# Patient Record
Sex: Male | Born: 1940 | Race: Black or African American | Hispanic: No | Marital: Married | Smoking: Former smoker
Health system: Southern US, Community
[De-identification: ages and names within clinical notes are randomized; demographics above are authoritative.]

## PROBLEM LIST (undated history)

## (undated) DIAGNOSIS — Z860100 Personal history of colon polyps, unspecified: Secondary | ICD-10-CM

## (undated) DIAGNOSIS — I10 Essential (primary) hypertension: Secondary | ICD-10-CM

## (undated) DIAGNOSIS — M1990 Unspecified osteoarthritis, unspecified site: Secondary | ICD-10-CM

## (undated) DIAGNOSIS — I1A0 Resistant hypertension: Secondary | ICD-10-CM

## (undated) DIAGNOSIS — I2510 Atherosclerotic heart disease of native coronary artery without angina pectoris: Secondary | ICD-10-CM

## (undated) DIAGNOSIS — I739 Peripheral vascular disease, unspecified: Principal | ICD-10-CM

## (undated) DIAGNOSIS — K219 Gastro-esophageal reflux disease without esophagitis: Secondary | ICD-10-CM

## (undated) DIAGNOSIS — Z8619 Personal history of other infectious and parasitic diseases: Secondary | ICD-10-CM

## (undated) DIAGNOSIS — I6529 Occlusion and stenosis of unspecified carotid artery: Secondary | ICD-10-CM

## (undated) DIAGNOSIS — I255 Ischemic cardiomyopathy: Principal | ICD-10-CM

## (undated) DIAGNOSIS — J189 Pneumonia, unspecified organism: Secondary | ICD-10-CM

## (undated) DIAGNOSIS — I440 Atrioventricular block, first degree: Secondary | ICD-10-CM

## (undated) DIAGNOSIS — E119 Type 2 diabetes mellitus without complications: Secondary | ICD-10-CM

## (undated) DIAGNOSIS — R531 Weakness: Secondary | ICD-10-CM

## (undated) DIAGNOSIS — M5137 Other intervertebral disc degeneration, lumbosacral region: Secondary | ICD-10-CM

## (undated) DIAGNOSIS — G8929 Other chronic pain: Secondary | ICD-10-CM

## (undated) DIAGNOSIS — M549 Dorsalgia, unspecified: Secondary | ICD-10-CM

## (undated) DIAGNOSIS — M51379 Other intervertebral disc degeneration, lumbosacral region without mention of lumbar back pain or lower extremity pain: Secondary | ICD-10-CM

## (undated) DIAGNOSIS — G5601 Carpal tunnel syndrome, right upper limb: Secondary | ICD-10-CM

## (undated) DIAGNOSIS — E785 Hyperlipidemia, unspecified: Secondary | ICD-10-CM

## (undated) HISTORY — PX: CORONARY ARTERY BYPASS GRAFT: SHX141

## (undated) HISTORY — DX: Essential (primary) hypertension: I10

## (undated) HISTORY — DX: Ischemic cardiomyopathy: I25.5

## (undated) HISTORY — DX: Atherosclerotic heart disease of native coronary artery without angina pectoris: I25.10

## (undated) HISTORY — DX: Resistant hypertension: I1A.0

## (undated) HISTORY — PX: COLONOSCOPY: SHX174

## (undated) HISTORY — DX: Occlusion and stenosis of unspecified carotid artery: I65.29

## (undated) HISTORY — PX: UMBILICAL HERNIA REPAIR: SHX196

## (undated) HISTORY — DX: Unspecified osteoarthritis, unspecified site: M19.90

## (undated) HISTORY — DX: Peripheral vascular disease, unspecified: I73.9

## (undated) HISTORY — PX: CORONARY ANGIOPLASTY: SHX604

## (undated) SURGERY — PERCUTANEOUS CORONARY STENT INTERVENTION (PCI-S)
Anesthesia: LOCAL

---

## 2010-04-03 ENCOUNTER — Other Ambulatory Visit: Payer: Self-pay | Admitting: Specialist

## 2010-04-03 ENCOUNTER — Encounter (HOSPITAL_COMMUNITY)
Admission: RE | Admit: 2010-04-03 | Discharge: 2010-04-03 | Disposition: A | Discharge status: Home / Self Care | Payer: Medicare Other | Source: Ambulatory Visit | Attending: Specialist | Admitting: Specialist

## 2010-04-03 ENCOUNTER — Other Ambulatory Visit (HOSPITAL_COMMUNITY): Payer: Self-pay | Admitting: Specialist

## 2010-04-03 ENCOUNTER — Encounter: Payer: Self-pay | Admitting: Specialist

## 2010-04-03 ENCOUNTER — Encounter (HOSPITAL_COMMUNITY): Payer: Medicare Other

## 2010-04-03 DIAGNOSIS — M1710 Unilateral primary osteoarthritis, unspecified knee: Secondary | ICD-10-CM

## 2010-04-03 DIAGNOSIS — Z01818 Encounter for other preprocedural examination: Secondary | ICD-10-CM | POA: Insufficient documentation

## 2010-04-03 DIAGNOSIS — IMO0002 Reserved for concepts with insufficient information to code with codable children: Secondary | ICD-10-CM | POA: Insufficient documentation

## 2010-04-03 DIAGNOSIS — Z01810 Encounter for preprocedural cardiovascular examination: Secondary | ICD-10-CM | POA: Insufficient documentation

## 2010-04-03 DIAGNOSIS — Z01812 Encounter for preprocedural laboratory examination: Secondary | ICD-10-CM | POA: Insufficient documentation

## 2010-04-03 LAB — DIFFERENTIAL
Basophils Absolute: 0 10*3/uL (ref 0.0–0.1)
Eosinophils Absolute: 0.2 10*3/uL (ref 0.0–0.7)
Lymphs Abs: 1.2 10*3/uL (ref 0.7–4.0)
Monocytes Absolute: 0.4 10*3/uL (ref 0.1–1.0)
Monocytes Relative: 6 % (ref 3–12)

## 2010-04-03 LAB — COMPREHENSIVE METABOLIC PANEL
ALT: 18 U/L (ref 0–53)
Alkaline Phosphatase: 56 U/L (ref 39–117)
CO2: 25 mEq/L (ref 19–32)
GFR calc non Af Amer: >60 mL/min (ref >60)
Glucose, Bld: 225 mg/dL — ABNORMAL HIGH (ref 70–99)
Potassium: 3.9 mEq/L (ref 3.5–5.1)
Sodium: 135 mEq/L (ref 135–145)

## 2010-04-03 LAB — CBC
HCT: 41.1 % (ref 39.0–52.0)
Hemoglobin: 13.1 g/dL (ref 13.0–17.0)
MCHC: 31.9 g/dL (ref 30.0–36.0)
MCV: 81.4 fL (ref 78.0–100.0)
WBC: 7 10*3/uL (ref 4.0–10.5)

## 2010-04-03 LAB — URINALYSIS, ROUTINE W REFLEX MICROSCOPIC
Nitrite: NEGATIVE
Protein, ur: NEGATIVE mg/dL
Urine Glucose, Fasting: 250 mg/dL — AB
pH: 6 (ref 5.0–8.0)

## 2010-04-03 LAB — SURGICAL PCR SCREEN: Staphylococcus aureus: NEGATIVE

## 2010-04-03 LAB — PROTIME-INR: Prothrombin Time: 13.9 seconds (ref 11.6–15.2)

## 2010-04-06 ENCOUNTER — Inpatient Hospital Stay (HOSPITAL_COMMUNITY): Admission: RE | Admit: 2010-04-06 | Payer: Medicare Other | Source: Ambulatory Visit | Admitting: Specialist

## 2010-05-15 ENCOUNTER — Ambulatory Visit (HOSPITAL_COMMUNITY)
Admission: RE | Admit: 2010-05-15 | Discharge: 2010-05-15 | Disposition: A | Discharge status: Home / Self Care | Payer: Medicare Other | Source: Ambulatory Visit | Attending: Cardiology | Admitting: Cardiology

## 2010-05-15 DIAGNOSIS — I2510 Atherosclerotic heart disease of native coronary artery without angina pectoris: Secondary | ICD-10-CM | POA: Insufficient documentation

## 2010-05-15 DIAGNOSIS — E119 Type 2 diabetes mellitus without complications: Secondary | ICD-10-CM | POA: Insufficient documentation

## 2010-05-15 HISTORY — PX: CARDIAC CATHETERIZATION: SHX172

## 2010-05-15 HISTORY — PX: LEFT HEART CATH AND CORONARY ANGIOGRAPHY: CATH118249

## 2010-05-16 ENCOUNTER — Encounter (INDEPENDENT_AMBULATORY_CARE_PROVIDER_SITE_OTHER): Payer: Medicare Other | Admitting: Cardiothoracic Surgery

## 2010-05-16 DIAGNOSIS — I2510 Atherosclerotic heart disease of native coronary artery without angina pectoris: Secondary | ICD-10-CM

## 2010-05-17 NOTE — Consult Note (Signed)
NEW PATIENT CONSULTATION  [name], [name] DOB:  03/13/1940                                        May 16, 2010 CHART #:  [id number]  REASON FOR CONSULTATION:  Severe multivessel coronary artery disease.  CHIEF COMPLAINT:  Abnormal EKG, abnormal stress test.  HISTORY OF PRESENT ILLNESS:  I was asked to evaluate this 70 year old African American diabetic, hypertensive male for possible multivessel bypass grafting after being recently diagnosed with a severe multivessel coronary artery disease.  The patient was evaluated by Dr. Herbie Baltimore for cardiology clearance prior to right knee surgery (total knee replacement).  The patient has no history of coronary artery disease or particularly strong family history of CAD.  His risk factors include a remote history of smoking, hypertension, hyperlipidemia, and type 2 diabetes mellitus.  He underwent an echo which showed EF of 45% with some inferior apical hypokinesia and a stress test was performed which showed reversible ischemia in the inferior and apical areas as well. This was felt to be a high-risk stress test and the patient subsequently underwent diagnostic cardiac catheterization 48 hours ago.  This was performed via the right radial artery by Dr. Rennis Golden.  This demonstrates a 99% stenosis of the LAD, 99% stenosis of the OM1 and 80% stenosis of the OM2 and a totally occluded right coronary which fills via collaterals in the posterior descending.  A ventriculogram was not performed due to his history of mildly elevated creatinine of 1.7 and his echo showing mild- to-moderate LV dysfunction.  He subsequently presents here for evaluation for multivessel bypass grafting.  The patient denies any specific angina, dyspnea on exertion, or orthopnea.  PAST MEDICAL HISTORY: 1. Hypertension. 2. Type 2 diabetes mellitus. 3. Dyslipidemia. 4. No known drug allergies. 5. 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 6. Status post umbilical hernia repair.  HOME MEDICATIONS:  Metformin 500 b.i.d., hydrochlorothiazide 12.5 daily, Coreg 6.25 mg b.i.d., losartan 100 mg daily, vitamin D, __________ 81 mg p.o. twice a week, Aleve p.r.n. arthritis pain in the knees, simvastatin 20 mg a day.  SOCIAL HISTORY:  The patient is retired from the [employer]. He is active in his yard work and he does not currently smoke or drink alcohol.  FAMILY HISTORY:  Negative for premature coronary disease.  Positive for diabetes and hypertension.  REVIEW OF SYSTEMS:  CONSTITUTIONAL:  Review is negative fever or weight loss. ENT:  Review is negative for difficulty swallowing, positive for partial plate in the maxilla. THORACIC:  Negative for history of thoracic trauma, pneumothorax, or rib fracture. CARDIAC:  Positive for coronary artery disease.  Negative for history of cardiac valve disease or arrhythmia or murmur. GI:  Negative for hepatitis, jaundice, or blood per rectum. UROLOGIC:  Negative for BPH or dysuria. VASCULAR:  Positive for varicose veins of the left leg treated with sclerotherapy, right leg venous review is normal.  No history of TIA or claudication. ENDOCRINE:  Positive for diabetes.  Negative for thyroid disease. NEUROLOGIC:  Negative for stroke or seizure.  PHYSICAL EXAMINATION:  Vital Signs:  The patient is 6 feet 2 inches and weighs 235 pounds, blood pressure 140/77, pulse 78, respirations 18, saturation 97%.  General:  He is alert and pleasant.  HEENT: Normocephalic.  Pupils are equal.  Dentition is adequate.  Neck:  Without JVD, mass, or bruit.  Lymphatics:  No palpable supraclavicular cervical adenopathy.  Breath sounds are clear and equal.  There is no thoracic deformity.  Cardiac:  Regular without S3, gallop, murmur, or rub.  Abdomen:  Soft, nontender, obese without pulsatile mass palpable. Extremities:  No clubbing, cyanosis, or edema.  He has  no evidence of hematoma in his right wrist.  Peripheral pulses are all intact.  He has some scars in his left lower leg along the pretibial region from varicose vein sclerotherapy.  The right leg shows no evidence of venous insufficiency.  Neurologic:  Intact.  LABORATORY DATA:  Reviewed his coronary arteriogram and agreed with Dr. Blanchie Dessert evaluation of severe multivessel disease with mild-to-moderate LV dysfunction.  PLAN:  The patient will be prepared for multivessel bypass grafting on May 22, 2010, with grafts being planned at the LAD, OM1, OM2, and the distal right posterior descending.  I have discussed the details of surgery, the expected benefits, the alternatives, and the risks involved. He understands and agrees to proceed.  He will stop his metformin 48 hours before surgery.  We will plan on doing an intraoperative TEE to evaluate his global LV function again.  Kerin Perna, M.D. Electronically Signed  PV/MEDQ  D:  05/16/2010  T:  05/17/2010  Job:  [id number]  cc:   Landry Corporal, MD

## 2010-05-17 NOTE — Procedures (Signed)
  NAMEHOWARD, [name]             ACCOUNT NO.:  [id number]  MEDICAL RECORD NO.:  [id number]           PATIENT TYPE:  O  LOCATION:  [facility]                         FACILITY:  [facility]  PHYSICIAN:  Italy Wyndell Cardiff, MD         DATE OF BIRTH:  [birth year]  DATE OF PROCEDURE:  05/15/2010 DATE OF DISCHARGE:  05/15/2010                           CARDIAC CATHETERIZATION   This is a left heart catheterization.  OPERATOR:  Italy Oskar Cretella, MD  INDICATIONS:  Abnormal stress test.  BACKGROUND FORMATION:  Mr. Ehrmann is a 70 year old gentleman who had an abnormal nuclear stress test showing an EF of 45-50% with moderate apical hypokinesis and midinferior wall hypokinesis.  His a nuclear stress test demonstrated moderate perfusion defect in the basal inferior, midinferior, and apical inferior walls, and there was anterolateral, inferolateral, and apical reversibility suggesting a large mostly reversible defect of the circumflex territory and possibly the right coronary artery territory.  PROCEDURE:  The patient was brought to the cardiac catheterization lab after procedural time-out and radiation safety time-out.  the patient was sterilely prepped and draped in the usual fashion.  The area around the right radial artery was identified and after local anesthesia was obtained, the patient was given 1 mg of Versed and 25 mcg of fentanyl for moderate sedation.  The right radial artery was accessed with a straight needle and wire, and a 5-French radial access catheter was placed.  Through that, subsequent cardiac catheterization was performed with a 5-French TIG 4.0 catheter, and ventricular pressure was obtained with the pigtail catheter.  The patient was given 5000 units of heparin per protocol and 10 mL of radial cocktail to prevent spasm.  FINDINGS: 1. Left main - no significant disease. 2. LAD - two sequential proximal 70-80% stenoses, and the LAD was also     distally subtotally occluded. 3. Left  circumflex 99% ostial OM1, which is a large branch and located     just distal to the circumflex takeoff, and a 75% left     circumflex/OM2 bifurcation lesion again with a large OM2 vessel. 4. RCA - proximally occluded, fills with collaterals from the left     system. 5. LVEDP = 15 mmHg.  IMPRESSION: 1. Three-vessel coronary artery disease in a diabetic. 2. LVEDP = 15 mmHg.  PLAN:  Evaluate for coronary artery bypass grafting.     Italy Rahsaan Weakland, MD     CH/MEDQ  D:  05/15/2010  T:  05/16/2010  Job:  [id number]  cc:   Landry Corporal, MD  Electronically Signed by Kirtland Bouchard. Derriana Oser M.D. on 05/17/2010 08:56:48 AM

## 2010-05-18 ENCOUNTER — Ambulatory Visit (HOSPITAL_COMMUNITY)
Admission: RE | Admit: 2010-05-18 | Discharge: 2010-05-18 | Disposition: A | Discharge status: Home / Self Care | Payer: Medicare Other | Source: Ambulatory Visit | Attending: Cardiothoracic Surgery | Admitting: Cardiothoracic Surgery

## 2010-05-18 ENCOUNTER — Inpatient Hospital Stay (HOSPITAL_COMMUNITY)
Admission: RE | Admit: 2010-05-18 | Discharge: 2010-05-18 | Disposition: A | Discharge status: Home / Self Care | Payer: Medicare Other | Source: Ambulatory Visit | Attending: Cardiothoracic Surgery | Admitting: Cardiothoracic Surgery

## 2010-05-18 ENCOUNTER — Encounter (HOSPITAL_COMMUNITY)
Admission: RE | Admit: 2010-05-18 | Discharge: 2010-05-18 | Disposition: A | Discharge status: Home / Self Care | Payer: Medicare Other | Source: Ambulatory Visit | Attending: Cardiothoracic Surgery | Admitting: Cardiothoracic Surgery

## 2010-05-18 ENCOUNTER — Other Ambulatory Visit: Payer: Self-pay | Admitting: Cardiothoracic Surgery

## 2010-05-18 DIAGNOSIS — I2510 Atherosclerotic heart disease of native coronary artery without angina pectoris: Secondary | ICD-10-CM

## 2010-05-18 DIAGNOSIS — Z01810 Encounter for preprocedural cardiovascular examination: Secondary | ICD-10-CM | POA: Insufficient documentation

## 2010-05-18 LAB — URINALYSIS, ROUTINE W REFLEX MICROSCOPIC
Bilirubin Urine: NEGATIVE
Glucose, UA: NEGATIVE mg/dL
Hgb urine dipstick: NEGATIVE
Ketones, ur: NEGATIVE mg/dL
Nitrite: NEGATIVE
Protein, ur: NEGATIVE mg/dL
Specific Gravity, Urine: 1.006 (ref 1.005–1.030)
Urobilinogen, UA: 0.2 mg/dL (ref 0.0–1.0)
pH: 6 (ref 5.0–8.0)

## 2010-05-18 LAB — COMPREHENSIVE METABOLIC PANEL
ALT: 21 U/L (ref 0–53)
AST: 17 U/L (ref 0–37)
Albumin: 3.8 g/dL (ref 3.5–5.2)
Alkaline Phosphatase: 55 U/L (ref 39–117)
BUN: 10 mg/dL (ref 6–23)
CO2: 21 mEq/L (ref 19–32)
Calcium: 9.5 mg/dL (ref 8.4–10.5)
Chloride: 104 mEq/L (ref 96–112)
Creatinine, Ser: 0.87 mg/dL (ref 0.4–1.5)
GFR calc Af Amer: >60 mL/min (ref >60)
GFR calc non Af Amer: >60 mL/min (ref >60)
Glucose, Bld: 111 mg/dL — ABNORMAL HIGH (ref 70–99)
Potassium: 4.3 mEq/L (ref 3.5–5.1)
Sodium: 133 mEq/L — ABNORMAL LOW (ref 135–145)
Total Bilirubin: 0.8 mg/dL (ref 0.3–1.2)
Total Protein: 7 g/dL (ref 6.0–8.3)

## 2010-05-18 LAB — BLOOD GAS, ARTERIAL
Acid-Base Excess: 0.7 mmol/L (ref 0.0–2.0)
Bicarbonate: 24.4 mEq/L — ABNORMAL HIGH (ref 20.0–24.0)
Drawn by: 181601
FIO2: 0.21 %
O2 Saturation: 97.2 %
Patient temperature: 98.6
TCO2: 25.5 mmol/L (ref 0–100)
pCO2 arterial: 36.2 mmHg (ref 35.0–45.0)
pH, Arterial: 7.443 (ref 7.350–7.450)
pO2, Arterial: 89.2 mmHg (ref 80.0–100.0)

## 2010-05-18 LAB — PROTIME-INR
INR: 0.98 (ref 0.00–1.49)
Prothrombin Time: 13.2 seconds (ref 11.6–15.2)

## 2010-05-18 LAB — TYPE AND SCREEN
ABO/RH(D): A POS
Antibody Screen: NEGATIVE

## 2010-05-18 LAB — CBC
HCT: 38.7 % — ABNORMAL LOW (ref 39.0–52.0)
Hemoglobin: 13.3 g/dL (ref 13.0–17.0)
MCH: 26.6 pg (ref 26.0–34.0)
MCHC: 34.4 g/dL (ref 30.0–36.0)
MCV: 77.4 fL — ABNORMAL LOW (ref 78.0–100.0)
Platelets: 177 10*3/uL (ref 150–400)
RBC: 5 MIL/uL (ref 4.22–5.81)
RDW: 13.3 % (ref 11.5–15.5)
WBC: 7.2 10*3/uL (ref 4.0–10.5)

## 2010-05-18 LAB — SURGICAL PCR SCREEN
MRSA, PCR: NEGATIVE
Staphylococcus aureus: NEGATIVE

## 2010-05-18 LAB — APTT: aPTT: 34 seconds (ref 24–37)

## 2010-05-18 LAB — ABO/RH: ABO/RH(D): A POS

## 2010-05-19 LAB — HEMOGLOBIN A1C
Hgb A1c MFr Bld: 7.4 % — ABNORMAL HIGH (ref <5.7)
Mean Plasma Glucose: 166 mg/dL — ABNORMAL HIGH (ref <117)

## 2010-05-22 ENCOUNTER — Inpatient Hospital Stay (HOSPITAL_COMMUNITY)
Admission: RE | Admit: 2010-05-22 | Discharge: 2010-05-26 | DRG: 236 | Disposition: A | Discharge status: Home / Self Care | Payer: Medicare Other | Source: Ambulatory Visit | Attending: Cardiothoracic Surgery | Admitting: Cardiothoracic Surgery

## 2010-05-22 ENCOUNTER — Inpatient Hospital Stay (HOSPITAL_COMMUNITY): Payer: Medicare Other

## 2010-05-22 DIAGNOSIS — I2510 Atherosclerotic heart disease of native coronary artery without angina pectoris: Secondary | ICD-10-CM

## 2010-05-22 DIAGNOSIS — Z7982 Long term (current) use of aspirin: Secondary | ICD-10-CM

## 2010-05-22 DIAGNOSIS — Z87891 Personal history of nicotine dependence: Secondary | ICD-10-CM

## 2010-05-22 DIAGNOSIS — J9819 Other pulmonary collapse: Secondary | ICD-10-CM | POA: Diagnosis not present

## 2010-05-22 DIAGNOSIS — E119 Type 2 diabetes mellitus without complications: Secondary | ICD-10-CM | POA: Diagnosis present

## 2010-05-22 DIAGNOSIS — E785 Hyperlipidemia, unspecified: Secondary | ICD-10-CM | POA: Diagnosis present

## 2010-05-22 DIAGNOSIS — D62 Acute posthemorrhagic anemia: Secondary | ICD-10-CM | POA: Diagnosis not present

## 2010-05-22 DIAGNOSIS — I10 Essential (primary) hypertension: Secondary | ICD-10-CM | POA: Diagnosis present

## 2010-05-22 DIAGNOSIS — D696 Thrombocytopenia, unspecified: Secondary | ICD-10-CM | POA: Diagnosis present

## 2010-05-22 LAB — POCT I-STAT 3, ART BLOOD GAS (G3+)
Acid-base deficit: 2 mmol/L (ref 0.0–2.0)
Acid-base deficit: 2 mmol/L (ref 0.0–2.0)
Acid-base deficit: 2 mmol/L (ref 0.0–2.0)
Bicarbonate: 23.1 mEq/L (ref 20.0–24.0)
Bicarbonate: 24 mEq/L (ref 20.0–24.0)
Bicarbonate: 23.3 mEq/L (ref 20.0–24.0)
O2 Saturation: 92 %
O2 Saturation: 98 %
O2 Saturation: 96 %
Patient temperature: 35.8
Patient temperature: 36
Patient temperature: 36.6
TCO2: 24 mmol/L (ref 0–100)
TCO2: 25 mmol/L (ref 0–100)
TCO2: 25 mmol/L (ref 0–100)
pCO2 arterial: 38.9 mmHg (ref 35.0–45.0)
pCO2 arterial: 44.3 mmHg (ref 35.0–45.0)
pCO2 arterial: 41.2 mmHg (ref 35.0–45.0)
pH, Arterial: 7.376 (ref 7.350–7.450)
pH, Arterial: 7.336 — ABNORMAL LOW (ref 7.350–7.450)
pH, Arterial: 7.36 (ref 7.350–7.450)
pO2, Arterial: 61 mmHg — ABNORMAL LOW (ref 80.0–100.0)
pO2, Arterial: 112 mmHg — ABNORMAL HIGH (ref 80.0–100.0)
pO2, Arterial: 81 mmHg (ref 80.0–100.0)

## 2010-05-22 LAB — CBC
HCT: 30 % — ABNORMAL LOW (ref 39.0–52.0)
HCT: 30.8 % — ABNORMAL LOW (ref 39.0–52.0)
Hemoglobin: 10.2 g/dL — ABNORMAL LOW (ref 13.0–17.0)
Hemoglobin: 10.3 g/dL — ABNORMAL LOW (ref 13.0–17.0)
MCH: 26.5 pg (ref 26.0–34.0)
MCH: 26.3 pg (ref 26.0–34.0)
MCHC: 34 g/dL (ref 30.0–36.0)
MCHC: 33.4 g/dL (ref 30.0–36.0)
MCV: 77.9 fL — ABNORMAL LOW (ref 78.0–100.0)
MCV: 78.6 fL (ref 78.0–100.0)
Platelets: 120 10*3/uL — ABNORMAL LOW (ref 150–400)
Platelets: 147 10*3/uL — ABNORMAL LOW (ref 150–400)
RBC: 3.85 MIL/uL — ABNORMAL LOW (ref 4.22–5.81)
RBC: 3.92 MIL/uL — ABNORMAL LOW (ref 4.22–5.81)
RDW: 13.3 % (ref 11.5–15.5)
RDW: 13.5 % (ref 11.5–15.5)
WBC: 7.9 10*3/uL (ref 4.0–10.5)
WBC: 10.9 10*3/uL — ABNORMAL HIGH (ref 4.0–10.5)

## 2010-05-22 LAB — POCT I-STAT, CHEM 8
BUN: 10 mg/dL (ref 6–23)
Calcium, Ion: 1.22 mmol/L (ref 1.12–1.32)
Chloride: 106 mEq/L (ref 96–112)
Creatinine, Ser: 1 mg/dL (ref 0.4–1.5)
Glucose, Bld: 176 mg/dL — ABNORMAL HIGH (ref 70–99)
HCT: 30 % — ABNORMAL LOW (ref 39.0–52.0)
Hemoglobin: 10.2 g/dL — ABNORMAL LOW (ref 13.0–17.0)
Potassium: 4.3 mEq/L (ref 3.5–5.1)
Sodium: 139 mEq/L (ref 135–145)
TCO2: 22 mmol/L (ref 0–100)

## 2010-05-22 LAB — POCT I-STAT 4, (NA,K, GLUC, HGB,HCT)
Glucose, Bld: 114 mg/dL — ABNORMAL HIGH (ref 70–99)
HCT: 30 % — ABNORMAL LOW (ref 39.0–52.0)
Hemoglobin: 10.2 g/dL — ABNORMAL LOW (ref 13.0–17.0)
Potassium: 3.7 mEq/L (ref 3.5–5.1)
Sodium: 137 mEq/L (ref 135–145)

## 2010-05-22 LAB — HEMOGLOBIN AND HEMATOCRIT, BLOOD
HCT: 25.6 % — ABNORMAL LOW (ref 39.0–52.0)
Hemoglobin: 8.6 g/dL — ABNORMAL LOW (ref 13.0–17.0)

## 2010-05-22 LAB — PROTIME-INR
INR: 1.36 (ref 0.00–1.49)
Prothrombin Time: 17 seconds — ABNORMAL HIGH (ref 11.6–15.2)

## 2010-05-22 LAB — MAGNESIUM: Magnesium: 3.1 mg/dL — ABNORMAL HIGH (ref 1.5–2.5)

## 2010-05-22 LAB — CREATININE, SERUM
Creatinine, Ser: 0.9 mg/dL (ref 0.4–1.5)
GFR calc Af Amer: >60 mL/min (ref >60)
GFR calc non Af Amer: >60 mL/min (ref >60)

## 2010-05-22 LAB — PLATELET COUNT: Platelets: 97 10*3/uL — ABNORMAL LOW (ref 150–400)

## 2010-05-22 LAB — APTT: aPTT: 35 seconds (ref 24–37)

## 2010-05-22 LAB — GLUCOSE, CAPILLARY
Glucose-Capillary: 170 mg/dL — ABNORMAL HIGH (ref 70–99)
Glucose-Capillary: 156 mg/dL — ABNORMAL HIGH (ref 70–99)

## 2010-05-23 ENCOUNTER — Inpatient Hospital Stay (HOSPITAL_COMMUNITY): Payer: Medicare Other

## 2010-05-23 DIAGNOSIS — E1165 Type 2 diabetes mellitus with hyperglycemia: Secondary | ICD-10-CM

## 2010-05-23 DIAGNOSIS — IMO0001 Reserved for inherently not codable concepts without codable children: Secondary | ICD-10-CM

## 2010-05-23 LAB — PREPARE PLATELETS: Unit division: 0

## 2010-05-23 LAB — BASIC METABOLIC PANEL
BUN: 9 mg/dL (ref 6–23)
CO2: 24 mEq/L (ref 19–32)
Calcium: 8.3 mg/dL — ABNORMAL LOW (ref 8.4–10.5)
Chloride: 107 mEq/L (ref 96–112)
Creatinine, Ser: 1 mg/dL (ref 0.4–1.5)
GFR calc Af Amer: >60 mL/min (ref >60)
GFR calc non Af Amer: >60 mL/min (ref >60)
Glucose, Bld: 131 mg/dL — ABNORMAL HIGH (ref 70–99)
Potassium: 4.3 mEq/L (ref 3.5–5.1)
Sodium: 136 mEq/L (ref 135–145)

## 2010-05-23 LAB — CBC
HCT: 30.7 % — ABNORMAL LOW (ref 39.0–52.0)
HCT: 31.2 % — ABNORMAL LOW (ref 39.0–52.0)
Hemoglobin: 10.1 g/dL — ABNORMAL LOW (ref 13.0–17.0)
Hemoglobin: 10.4 g/dL — ABNORMAL LOW (ref 13.0–17.0)
MCH: 25.8 pg — ABNORMAL LOW (ref 26.0–34.0)
MCH: 26.5 pg (ref 26.0–34.0)
MCHC: 32.9 g/dL (ref 30.0–36.0)
MCHC: 33.3 g/dL (ref 30.0–36.0)
MCV: 78.5 fL (ref 78.0–100.0)
MCV: 79.4 fL (ref 78.0–100.0)
Platelets: 161 10*3/uL (ref 150–400)
Platelets: 146 10*3/uL — ABNORMAL LOW (ref 150–400)
RBC: 3.91 MIL/uL — ABNORMAL LOW (ref 4.22–5.81)
RBC: 3.93 MIL/uL — ABNORMAL LOW (ref 4.22–5.81)
RDW: 13.6 % (ref 11.5–15.5)
RDW: 13.7 % (ref 11.5–15.5)
WBC: 9.7 10*3/uL (ref 4.0–10.5)
WBC: 12.4 10*3/uL — ABNORMAL HIGH (ref 4.0–10.5)

## 2010-05-23 LAB — POCT I-STAT, CHEM 8
BUN: 13 mg/dL (ref 6–23)
Calcium, Ion: 1.21 mmol/L (ref 1.12–1.32)
Chloride: 100 mEq/L (ref 96–112)
Creatinine, Ser: 1.3 mg/dL (ref 0.4–1.5)
Glucose, Bld: 220 mg/dL — ABNORMAL HIGH (ref 70–99)
HCT: 32 % — ABNORMAL LOW (ref 39.0–52.0)
Hemoglobin: 10.9 g/dL — ABNORMAL LOW (ref 13.0–17.0)
Potassium: 4.3 mEq/L (ref 3.5–5.1)
Sodium: 135 mEq/L (ref 135–145)
TCO2: 25 mmol/L (ref 0–100)

## 2010-05-23 LAB — GLUCOSE, CAPILLARY
Glucose-Capillary: 112 mg/dL — ABNORMAL HIGH (ref 70–99)
Glucose-Capillary: 124 mg/dL — ABNORMAL HIGH (ref 70–99)
Glucose-Capillary: 126 mg/dL — ABNORMAL HIGH (ref 70–99)
Glucose-Capillary: 138 mg/dL — ABNORMAL HIGH (ref 70–99)
Glucose-Capillary: 173 mg/dL — ABNORMAL HIGH (ref 70–99)
Glucose-Capillary: 189 mg/dL — ABNORMAL HIGH (ref 70–99)
Glucose-Capillary: 193 mg/dL — ABNORMAL HIGH (ref 70–99)
Glucose-Capillary: 209 mg/dL — ABNORMAL HIGH (ref 70–99)
Glucose-Capillary: 74 mg/dL (ref 70–99)
Glucose-Capillary: 90 mg/dL (ref 70–99)

## 2010-05-23 LAB — MAGNESIUM
Magnesium: 2.8 mg/dL — ABNORMAL HIGH (ref 1.5–2.5)
Magnesium: 2.4 mg/dL (ref 1.5–2.5)

## 2010-05-23 LAB — CREATININE, SERUM
Creatinine, Ser: 1.09 mg/dL (ref 0.4–1.5)
GFR calc Af Amer: >60 mL/min (ref >60)
GFR calc non Af Amer: >60 mL/min (ref >60)

## 2010-05-23 NOTE — Op Note (Signed)
[name], [name] NO.:  [id number]  MEDICAL RECORD NO.:  [id number]           PATIENT TYPE:  I  LOCATION:  2301                         FACILITY:  [facility]  PHYSICIAN:  Kerin Perna, M.D.  DATE OF BIRTH:  06-06-1940  DATE OF PROCEDURE:  05/22/2010 DATE OF DISCHARGE:                              OPERATIVE REPORT   OPERATION: 1. Coronary artery bypass grafting x5 (left internal mammary artery to     left anterior descending (coronary) artery, saphenous vein graft to     distal right coronary artery, saphenous vein graft to ramus     intermediate, sequential saphenous vein graft to obtuse marginal 1     and obtuse marginal 2). 2. Endoscopic harvest of right leg greater saphenous vein.  SURGEON:  Kerin Perna, MD  ASSISTANT:  Billy Crane PA-C  PREOPERATIVE DIAGNOSIS:  Severe multivessel coronary artery disease with positive stress test.  POSTOPERATIVE DIAGNOSIS:  Severe multivessel coronary artery disease with positive stress test.  ANESTHESIA:  General by Dr. Claybon George who performed transesophageal 2-D echo.  INDICATIONS:  The patient is a 70 year old African American male who presents with positive stress test which was taken prior to elective knee replacement surgery.  A subsequent cardiac catheterization was recommended by Dr. Herbie George which demonstrated severe multivessel coronary artery disease with total occlusion of right coronary, 90% stenosis of the LAD, total occlusion of the ramus intermediate and 95% stenosis of proximal circumflex.  Overall LVEF by echo was 45% and he was felt to be candidate for surgical revascularization based on his coronary anatomy and early positive stress test.  Prior to surgery, I examined the patient in the office and reviewed results of the cardiac cath with the patient and his family.  I discussed the indications and expected benefits of coronary artery bypass surgery for treatment of his severe  multivessel coronary artery disease.  I reviewed the alternatives to surgical therapy as well.  I discussed the details of surgery including location of the surgical incisions, use of general anesthesia and cardiopulmonary bypass, and the expected postoperative recovery.  I discussed the risks he would face with the operation including the risks of stroke, MI, bleeding, infection, blood transfusion requirement, and death.  After reviewing these issues, he demonstrated his understanding and agreed to proceed with surgery under what I felt was an informed consent.  OPERATIVE FINDINGS:  The conduit was excellent internal mammary artery and fair to good saphenous vein quality.  The coronary targets were adequate but the distal RCA was intramyocardial, the ramus intermediate was intramyocardial.  PROCEDURE:  The patient was brought to the operating room and placed spine on the operating room table where general anesthesia was induced under invasive hemodynamic monitoring.  A transesophageal 2-D echo probe was placed by the anesthesiologist which confirmed preoperative ejection fraction of 45% without significant valvular disease.  The patient was prepped and draped as a sterile field and a sternal incision was made as the saphenous vein was harvested endoscopically from the right leg.  The left internal mammary artery was harvested as a pedicle graft from its origin at the subclavian vessels  and was a 1.5-mm vessel with good flow. Heparin was administered after the vein was harvested and inspected and a sternal retractor was placed using the deep blades due to the patient's body habitus.  The pericardium was opened and suspended and pursestrings were placed in the ascending aorta and right atrium.  After the ACT was documented as being therapeutic, the patient was cannulated and placed on cardiopulmonary bypass at which time the distal coronary arteries were identified for grafting.  The  mammary artery and vein grafts were prepared for the distal anastomoses and cardioplegic catheters were placed for both antegrade and retrograde cold blood cardioplegia.  The patient was cooled to 32 degrees at which time the aortic crossclamp was applied.  1 liter of cold blood cardioplegia was delivered in split doses between the antegrade aortic and retrograde coronary sinus catheters.  There was good cardioplegic arrest and septal temperature dropped less than 12 degrees.  Cardioplegia was delivered every 20 minutes or less while the crossclamp was in place.  The distal coronary anastomoses were then performed.  First, distal anastomosis was to the distal right coronary at the bifurcation of the posterior descending.  The posterior descending was fairly deep intramyocardial.  The vessel was 1.5 mm and the reverse saphenous vein was sewn end-to-side with running 7-0 Prolene with good flow from the graft.  The second distal anastomosis was to the ramus intermediate branch of the left coronary.  It was intramyocardial and was a 1.5-mm vessel which was totally occluded proximally.  A reverse saphenous vein was sewn end-to-side with running 7-0 Prolene with good flow through the graft.  The third and fourth distal anastomoses consisted of the sequential vein graft from the OM-1 to the OM-2.  The OM-1 was a 1.5-mm vessel with proximal 80% stenosis and a side-to-side anastomosis with a reverse saphenous vein was sewn with running 7-0 Prolene with good flow. The fourth distal anastomosis was a continuation of the sequential vein graft to the OM2 which was also intramyocardial and a 1.7-mm vessel which had a high-grade 80-90% stenosis.  The end of the vein was sewn end-to-side with running 7-0 Prolene with good flow through the graft. Cardioplegia was redosed.  The fifth distal anastomosis was to the middle third of the LAD.  Proximally, there was a 90% stenosis.  The left IMA pedicle was  brought through an opening created in the left lateral pericardium and was brought down onto the LAD and sewn end-to- side with running 8-0 Prolene.  There was good flow through the anastomosis after briefly releasing the pedicle bulldog on the mammary artery which was then replaced and the pedicle was secured to the epicardium with 6-0 Prolenes.  Cardioplegia was redosed.  While the crossclamp was still in place, three proximal vein anastomoses were performed on the ascending aorta using a 4.0-mm punch with running 7-0 Prolene.  Prior to tying down the final proximal anastomosis, air was vented from the coronaries with a dose of retrograde warm blood cardioplegia.  The crossclamp was then removed.  The heart resumed a spontaneous rhythm.  The cardioplegic catheters were removed.  The air was vented from the vein grafts with a 27-gauge needle.  The patient was rewarmed.  The distal and proximal anastomoses were checked and found to be hemostatic.  Temporary pacing wires were applied on the right atrium and right ventricle and the ventilator was resumed after the lungs reinflated.  When the patient was adequately rewarmed to 37 degrees, we weaned the  patient from bypass without difficulty with low-dose dopamine.  Protamine was administered after the patient remained hemodynamically stable with good global LV function by TEE.  There was no adverse reaction of the protamine and the platelet came back 90,000 with some persistent coagulopathy so a unit of platelet was transfused with improved coagulation function.  The mediastinum was irrigated and the superior pericardial fat was closed over the aorta and vein grafts.  Two mediastinal and a left pleural chest tubes were placed and brought out through separate incisions.  At this time, the sternum was closed with interrupted steel wire.  The pectoralis fascia was closed with a running #1 Vicryl.  The subcutaneous and skin layers were closed  with running Vicryl and sterile dressings were applied.  Total bypass time was 151 minutes.     Kerin Perna, M.D.     PV/MEDQ  D:  05/22/2010  T:  05/23/2010  Job:  [id number]  cc:   Landry Corporal, MD  Electronically Signed by Kerin Perna M.D. on 05/23/2010 03:59:12 PM

## 2010-05-24 ENCOUNTER — Inpatient Hospital Stay (HOSPITAL_COMMUNITY): Payer: Medicare Other

## 2010-05-24 DIAGNOSIS — I2510 Atherosclerotic heart disease of native coronary artery without angina pectoris: Secondary | ICD-10-CM | POA: Diagnosis present

## 2010-05-24 DIAGNOSIS — E1165 Type 2 diabetes mellitus with hyperglycemia: Secondary | ICD-10-CM

## 2010-05-24 DIAGNOSIS — IMO0001 Reserved for inherently not codable concepts without codable children: Secondary | ICD-10-CM

## 2010-05-24 LAB — GLUCOSE, CAPILLARY
Glucose-Capillary: 127 mg/dL — ABNORMAL HIGH (ref 70–99)
Glucose-Capillary: 163 mg/dL — ABNORMAL HIGH (ref 70–99)
Glucose-Capillary: 102 mg/dL — ABNORMAL HIGH (ref 70–99)
Glucose-Capillary: 107 mg/dL — ABNORMAL HIGH (ref 70–99)

## 2010-05-24 LAB — BASIC METABOLIC PANEL
BUN: 9 mg/dL (ref 6–23)
CO2: 28 mEq/L (ref 19–32)
Calcium: 8.4 mg/dL (ref 8.4–10.5)
Chloride: 104 mEq/L (ref 96–112)
Creatinine, Ser: 0.97 mg/dL (ref 0.4–1.5)
GFR calc Af Amer: >60 mL/min (ref >60)
GFR calc non Af Amer: >60 mL/min (ref >60)
Glucose, Bld: 93 mg/dL (ref 70–99)
Potassium: 3.9 mEq/L (ref 3.5–5.1)
Sodium: 136 mEq/L (ref 135–145)

## 2010-05-24 LAB — CBC
HCT: 29 % — ABNORMAL LOW (ref 39.0–52.0)
Hemoglobin: 9.6 g/dL — ABNORMAL LOW (ref 13.0–17.0)
MCH: 26.3 pg (ref 26.0–34.0)
MCHC: 33.1 g/dL (ref 30.0–36.0)
MCV: 79.5 fL (ref 78.0–100.0)
Platelets: 132 10*3/uL — ABNORMAL LOW (ref 150–400)
RBC: 3.65 MIL/uL — ABNORMAL LOW (ref 4.22–5.81)
RDW: 13.7 % (ref 11.5–15.5)
WBC: 8.5 10*3/uL (ref 4.0–10.5)

## 2010-05-25 ENCOUNTER — Inpatient Hospital Stay (HOSPITAL_COMMUNITY): Payer: Medicare Other

## 2010-05-25 DIAGNOSIS — IMO0001 Reserved for inherently not codable concepts without codable children: Secondary | ICD-10-CM

## 2010-05-25 DIAGNOSIS — E1165 Type 2 diabetes mellitus with hyperglycemia: Secondary | ICD-10-CM

## 2010-05-25 LAB — CBC
HCT: 30.4 % — ABNORMAL LOW (ref 39.0–52.0)
Hemoglobin: 9.8 g/dL — ABNORMAL LOW (ref 13.0–17.0)
MCH: 25.6 pg — ABNORMAL LOW (ref 26.0–34.0)
MCHC: 32.2 g/dL (ref 30.0–36.0)
MCV: 79.4 fL (ref 78.0–100.0)
Platelets: 173 10*3/uL (ref 150–400)
RBC: 3.83 MIL/uL — ABNORMAL LOW (ref 4.22–5.81)
RDW: 13.9 % (ref 11.5–15.5)
WBC: 8.9 10*3/uL (ref 4.0–10.5)

## 2010-05-25 LAB — BASIC METABOLIC PANEL
BUN: 9 mg/dL (ref 6–23)
CO2: 28 mEq/L (ref 19–32)
Calcium: 8.9 mg/dL (ref 8.4–10.5)
Chloride: 100 mEq/L (ref 96–112)
Creatinine, Ser: 0.95 mg/dL (ref 0.4–1.5)
GFR calc Af Amer: >60 mL/min (ref >60)
GFR calc non Af Amer: >60 mL/min (ref >60)
Glucose, Bld: 98 mg/dL (ref 70–99)
Potassium: 3.9 mEq/L (ref 3.5–5.1)
Sodium: 135 mEq/L (ref 135–145)

## 2010-05-25 LAB — GLUCOSE, CAPILLARY
Glucose-Capillary: 103 mg/dL — ABNORMAL HIGH (ref 70–99)
Glucose-Capillary: 131 mg/dL — ABNORMAL HIGH (ref 70–99)
Glucose-Capillary: 110 mg/dL — ABNORMAL HIGH (ref 70–99)
Glucose-Capillary: 105 mg/dL — ABNORMAL HIGH (ref 70–99)

## 2010-05-26 LAB — POCT I-STAT 4, (NA,K, GLUC, HGB,HCT)
Glucose, Bld: 134 mg/dL — ABNORMAL HIGH (ref 70–99)
Glucose, Bld: 156 mg/dL — ABNORMAL HIGH (ref 70–99)
Glucose, Bld: 106 mg/dL — ABNORMAL HIGH (ref 70–99)
Glucose, Bld: 108 mg/dL — ABNORMAL HIGH (ref 70–99)
Glucose, Bld: 125 mg/dL — ABNORMAL HIGH (ref 70–99)
Glucose, Bld: 157 mg/dL — ABNORMAL HIGH (ref 70–99)
HCT: 28 % — ABNORMAL LOW (ref 39.0–52.0)
HCT: 34 % — ABNORMAL LOW (ref 39.0–52.0)
HCT: 26 % — ABNORMAL LOW (ref 39.0–52.0)
HCT: 27 % — ABNORMAL LOW (ref 39.0–52.0)
HCT: 27 % — ABNORMAL LOW (ref 39.0–52.0)
HCT: 28 % — ABNORMAL LOW (ref 39.0–52.0)
Hemoglobin: 11.6 g/dL — ABNORMAL LOW (ref 13.0–17.0)
Hemoglobin: 9.5 g/dL — ABNORMAL LOW (ref 13.0–17.0)
Hemoglobin: 8.8 g/dL — ABNORMAL LOW (ref 13.0–17.0)
Hemoglobin: 9.2 g/dL — ABNORMAL LOW (ref 13.0–17.0)
Hemoglobin: 9.2 g/dL — ABNORMAL LOW (ref 13.0–17.0)
Hemoglobin: 9.5 g/dL — ABNORMAL LOW (ref 13.0–17.0)
Potassium: 3.5 mEq/L (ref 3.5–5.1)
Potassium: 3.9 mEq/L (ref 3.5–5.1)
Potassium: 4.4 mEq/L (ref 3.5–5.1)
Potassium: 5.2 mEq/L — ABNORMAL HIGH (ref 3.5–5.1)
Potassium: 4 mEq/L (ref 3.5–5.1)
Potassium: 4.6 mEq/L (ref 3.5–5.1)
Sodium: 135 mEq/L (ref 135–145)
Sodium: 139 mEq/L (ref 135–145)
Sodium: 135 mEq/L (ref 135–145)
Sodium: 136 mEq/L (ref 135–145)
Sodium: 137 mEq/L (ref 135–145)
Sodium: 138 mEq/L (ref 135–145)

## 2010-05-26 LAB — POCT I-STAT 3, ART BLOOD GAS (G3+)
Acid-base deficit: 1 mmol/L (ref 0.0–2.0)
Acid-base deficit: 1 mmol/L (ref 0.0–2.0)
Bicarbonate: 23.5 mEq/L (ref 20.0–24.0)
Bicarbonate: 23 mEq/L (ref 20.0–24.0)
O2 Saturation: 100 %
O2 Saturation: 100 %
TCO2: 25 mmol/L (ref 0–100)
TCO2: 24 mmol/L (ref 0–100)
pCO2 arterial: 39 mmHg (ref 35.0–45.0)
pCO2 arterial: 36.8 mmHg (ref 35.0–45.0)
pH, Arterial: 7.387 (ref 7.350–7.450)
pH, Arterial: 7.405 (ref 7.350–7.450)
pO2, Arterial: 358 mmHg — ABNORMAL HIGH (ref 80.0–100.0)
pO2, Arterial: 280 mmHg — ABNORMAL HIGH (ref 80.0–100.0)

## 2010-05-26 LAB — POCT I-STAT 3, VENOUS BLOOD GAS (G3P V)
Acid-base deficit: 1 mmol/L (ref 0.0–2.0)
Bicarbonate: 24.3 mEq/L — ABNORMAL HIGH (ref 20.0–24.0)
O2 Saturation: 85 %
TCO2: 26 mmol/L (ref 0–100)
pCO2, Ven: 42.1 mmHg — ABNORMAL LOW (ref 45.0–50.0)
pH, Ven: 7.37 — ABNORMAL HIGH (ref 7.250–7.300)
pO2, Ven: 51 mmHg — ABNORMAL HIGH (ref 30.0–45.0)

## 2010-05-26 LAB — POCT I-STAT GLUCOSE
Glucose, Bld: 175 mg/dL — ABNORMAL HIGH (ref 70–99)
Glucose, Bld: 126 mg/dL — ABNORMAL HIGH (ref 70–99)
Operator id: 230421
Operator id: 3342

## 2010-05-26 LAB — GLUCOSE, CAPILLARY
Glucose-Capillary: 107 mg/dL — ABNORMAL HIGH (ref 70–99)
Glucose-Capillary: 116 mg/dL — ABNORMAL HIGH (ref 70–99)

## 2010-05-29 LAB — POCT I-STAT 4, (NA,K, GLUC, HGB,HCT)
Glucose, Bld: 165 mg/dL — ABNORMAL HIGH (ref 70–99)
HCT: 35 % — ABNORMAL LOW (ref 39.0–52.0)
Hemoglobin: 11.9 g/dL — ABNORMAL LOW (ref 13.0–17.0)
Potassium: 4 mEq/L (ref 3.5–5.1)
Sodium: 139 mEq/L (ref 135–145)

## 2010-05-30 NOTE — Discharge Summary (Signed)
[name], [name] NO.:  [id number]  MEDICAL RECORD NO.:  [id number]           PATIENT TYPE:  I  LOCATION:  2017                         FACILITY:  [facility]  PHYSICIAN:  Kerin Perna, M.D.  DATE OF BIRTH:  [birth year]  DATE OF ADMISSION:  05/22/2010 DATE OF DISCHARGE:  05/26/2010                              DISCHARGE SUMMARY   HISTORY:  The patient is a 70 year old male with history of multiple medical comorbidities including diabetes and hypertension who was recently evaluated for an abnormal stress test and abnormal EKG and found to have severe multivessel coronary artery disease.  He was admitted for surgical revascularization.  The patient was evaluated by Dr. Herbie Baltimore for cardiology clearance prior to right knee surgery, which was planned to be a total knee replacement.  The patient had no history of coronary artery disease or particular strong family history of coronary artery disease, but did have a remote history of smoking as well as hypertension, hyperlipidemia, and type 2 diabetes mellitus.  He underwent an echocardiogram which showed ejection fraction of 45% with some inferoapical hypokinesis and a stress test was performed, which showed reversible ischemia in the inferior and apical areas as well. This was felt to be a high-risk testing and the patient subsequently underwent cardiac catheterization.  This was performed via the radial artery by Dr. Rennis Golden.  This demonstrated a 99% stenosis of the LAD, 99% stenosis of the obtuse marginal #1, an 80% stenosis of the obtuse marginal #2, and a totally occluded right coronary artery which filled via collaterals into the posterior descending.  The ventriculogram was not performed due to a history of mildly elevated creatinine of 1.7 and his echo showing mild-to-moderate LV dysfunction.  He was referred to Dr. Donata Clay for surgical opinion.  Dr. Donata Clay evaluated the patient and his studies and agreed  with recommendations to proceed with surgical revascularization.  He was admitted to this hospitalization for this procedure.  PAST MEDICAL HISTORY: 1. Hypertension. 2. Type 2 diabetes mellitus. 3. Dyslipidemia.  ALLERGIES:  No known drug allergies.  PAST SURGICAL HISTORY: 1. Venous insufficiency of the left leg status post sclerotherapy of     varicose veins. 2. Status post umbilical hernia repair.  HOME MEDICATIONS:  Include the following; 1. Metformin 500 mg b.i.d. 2. Hydrochlorothiazide 12.5 daily. 3. Coreg 6.25 mg b.i.d. 4. Losartan 100 mg daily. 5. Vitamin D daily. 6. Enteric-coated aspirin 81 mg twice a week. 7. Aleve p.r.n. 8. Arthritis pain medication for his knees, unspecified. 9. Simvastatin 20 mg daily.  SOCIAL HISTORY:  The patient is retired from [employer].  He is active in yard work.  He does not currently smoke or drink.  He does have a remote history of tobacco use.  FAMILY HISTORY:  Negative for premature coronary artery disease. Positive for diabetes and hypertension.  REVIEW OF SYSTEMS:  Please see the history and physical done at the time of admission.  PHYSICAL EXAMINATION:  Please see the history and physical done at the time of admission.  HOSPITAL COURSE:  The patient was admitted electively and on May 22, 2010, underwent the  following procedure.  Coronary artery bypass grafting x5, the following grafts were placed. 1. Left internal mammary artery to the LAD. 2. A saphenous vein graft to the distal right coronary artery. 3. A saphenous vein graft to the ramus intermediate. 4. A saphenous vein graft to the obtuse marginal #1 and #2 in a     sequential fashion.  The patient tolerated procedure well and was taken to the surgical intensive care unit in stable condition.  POSTOPERATIVE HOSPITAL COURSE:  The patient's postoperative hospital course has been unremarkable.  All routine lines, monitors, drainage devices were discontinued  in the standard fashion.  He was weaned from the ventilator without difficulty.  He remains neurologically intact. He was weaned from all inotropic support without difficulty.  He is tolerating gradual increase in activities using standard protocols.  His diabetes has been under excellent control using standard protocols and conversion to his oral home regimen.  He does have an acute blood loss anemia.  His values are stabilized.  His most recent hemoglobin and hematocrit dated May 25, 2010 are 9.8 and 30.4 respectively. Electrolytes, BUN and creatinine are within normal limits.  His incisions are healing well without evidence of infection.  He is tolerating cardiac rehabilitation without difficulty.  Oxygen has been weaned and he maintains good saturations on room air.  He does have a moderate volume overload, but has responded well to diuretics.  He has had no significant cardiac dysrhythmias.  Overall, his status is felt to be stable for discharge on today's date May 26, 2010.  MEDICATIONS ON DISCHARGE:  Are as follows; 1. Aspirin 325 mg enteric-coated tablet once daily. 2. Coreg 6.25 mg twice daily. 3. Vicodin 5/325 one tablet every 4-6 hours p.r.n. 4. Iron complex 150 mg daily. 5. Losartan 50 mg daily. 6. Hydrochlorothiazide 12.5 mg daily. 7. Metformin 500 mg twice daily. 8. Simvastatin 20 mg daily.  Notation, his blood pressure had been under fairly good control on this current regimen.  He may require further increase in his losartan if his blood pressure becomes high as preoperatively.  INSTRUCTIONS:  The patient received written instructions regarding medications, activity, diet, wound care, and followup.  Followup include Dr. Donata Clay on Jun 14, 2010, at 12 noon with a chest x-ray. Additionally, he is instructed to follow up with Dr. Herbie Baltimore in 2 weeks.  FINAL DIAGNOSES:  Severe three-vessel coronary artery disease, now status post surgical revascularization as  described.  Other diagnoses include postoperative volume overload, mild postoperative acute blood loss anemia, mild hypertension, diabetes mellitus type 2, dyslipidemia, osteoarthritis of the knees, remote tobacco use, obesity, and mild renal insufficiency, preoperatively with creatinine of 1.7, now currently 0.9.     Rowe Clack, P.A.-C.   ______________________________ Kerin Perna, M.D.    Sherryll Burger  D:  05/26/2010  T:  05/26/2010  Job:  [id number]  cc:   Landry Corporal, MD  Electronically Signed by Gershon Crane P.A.-C. on 05/30/2010 02:21:47 PM Electronically Signed by Kerin Perna M.D. on 05/30/2010 06:48:02 PM

## 2010-06-13 ENCOUNTER — Other Ambulatory Visit: Payer: Self-pay | Admitting: Cardiothoracic Surgery

## 2010-06-13 DIAGNOSIS — I2510 Atherosclerotic heart disease of native coronary artery without angina pectoris: Secondary | ICD-10-CM

## 2010-06-14 ENCOUNTER — Ambulatory Visit (INDEPENDENT_AMBULATORY_CARE_PROVIDER_SITE_OTHER): Payer: Self-pay | Admitting: Cardiothoracic Surgery

## 2010-06-14 ENCOUNTER — Ambulatory Visit
Admission: RE | Admit: 2010-06-14 | Discharge: 2010-06-14 | Disposition: A | Discharge status: Home / Self Care | Payer: Medicare Other | Source: Ambulatory Visit | Attending: Cardiothoracic Surgery | Admitting: Cardiothoracic Surgery

## 2010-06-14 DIAGNOSIS — I2510 Atherosclerotic heart disease of native coronary artery without angina pectoris: Secondary | ICD-10-CM

## 2010-06-15 NOTE — Assessment & Plan Note (Signed)
OFFICE VISIT  GABREAL, WORTON DOB:  04-Jan-1941                                        Jun 14, 2010 CHART #:  [id number]  CURRENT PROBLEMS: 1. Status post coronary artery bypass grafting x5 on May 22, 2010 for     severe multivessel coronary artery disease. 2. Diabetes mellitus. 3. Hypertension.  PRESENT ILLNESS:  Mr. [name] is a very nice 70 year old gentleman who returns for his first office visit after being discharged home following multivessel bypass grafting, which was performed due to a positive stress test before knee surgery which led to a cardiac cath, which documented severe multivessel coronary artery disease with EF of 45%. At the time of surgery, he underwent left IMA grafting to his LAD, vein graft to distal right coronary artery, vein graft to the ramus intermediate, and a sequential vein graft to the OM-1 and OM-1 marginal branches of the circumflex.  Postoperatively, he did well and remained in sinus rhythm.  He was discharged home on the fifth postoperative day on aspirin, Coreg, iron, losartan 50 mg daily, hydrochlorothiazide, metformin 500 b.i.d., and simvastatin 20 mg a day.  He has not required any pain medication.  He has had no recurrent symptoms of angina or CHF and the surgical incisions are healing well.  He is walking approximately 10-15 minutes daily.  His postop visit with Dr. Herbie Baltimore is pending.  He is interested in starting cardiac rehab at [facility].  PHYSICAL EXAMINATION:  Vital Signs:  Blood pressure 120/70, pulse 80 and regular, respirations 18, saturation 96%, appears to be doing very well. Lungs:  Breath sounds are clear and equal.  The sternum is stable, well healed.  Cardiac:  Rhythm is regular without gallop or murmur. Extremities:  The leg incision is well healed.  There is no pedal edema.  A PA and lateral chest x-ray reveals clear lung fields without pleural effusion.  The sternal wires are  intact and the mediastinal shadow is normal.  IMPRESSION AND PLAN:  The patient has done well now 4 weeks after surgery and he is ready to resume driving and light activities.  He knows not to lift more than 20 pounds until 3 months after surgery.  I told him that he should remain on his current medications until he checks back with Dr. Herbie Baltimore.  We will facilitate a referral to [facility] outpatient cardiac rehab program which I feel will benefit the patient greatly.  He will return here as needed.  Kerin Perna, M.D. Electronically Signed  PV/MEDQ  D:  06/14/2010  T:  06/15/2010  Job:  [id number]  cc:   Landry Corporal, MD Lia Hopping

## 2010-08-21 HISTORY — PX: TRANSTHORACIC ECHOCARDIOGRAM: SHX275

## 2010-08-23 NOTE — H&P (Signed)
NAMEJLON, BETKER NO.:  [id number]  MEDICAL RECORD NO.:  [id number]         PATIENT TYPE:  LINP  LOCATION:                               FACILITY:  [facility]  PHYSICIAN:  Jene Every, M.D.    DATE OF BIRTH:  1940-08-10  DATE OF ADMISSION:  04/06/2010 DATE OF DISCHARGE:                             HISTORY & PHYSICAL   CHIEF COMPLAINT:  Right knee pain.  HISTORY OF PRESENT ILLNESS:  Mr. [name] is a pleasant 70 year old gentleman who has noted long onset of knee pain with progression of his symptoms where he has inability to walk which is significantly affecting his daily activities.  He does not note any instability in the knee.  X- rays were reviewed which do show a varus deformity of the knee with progressive collapse.  It is felt this time he would benefit from a total knee arthroplasty.  Risks and benefits of the surgery were discussed with the patient.  He does elect to proceed.PAST MEDICAL HISTORY: 1. Type 2 diabetes. 2. Hypertension. 3. Hypercholesteremia.  REVIEW OF MEDICATIONS: 1. Metformin 500 mg b.i.d. 2. Losartan 100 mg 1 p.o. daily. 3. HCTZ 12 mg 1 p.o. daily. 4. Simvastatin 20 mg 1 p.o. daily.  ALLERGIES:  None.  PAST SURGICAL HISTORY:  Hernia repair.  SOCIAL HISTORY:  The patient is married.  History of no tobacco or alcohol.  PRIMARY CARE PHYSICIAN:  Dr. Lia Hopping.  FAMILY HISTORY:  Father living at age 57.  Mother deceased at 43 of cancer.  The patient does have a brother who is 38 who has renal issues.  REVIEW OF SYSTEMS:  GENERAL:  The patient denies any fever, chills, night sweats, or bleeding tendencies.  CNS:  No blurred or double vision, seizure, headache, or paralysis.  RESPIRATORY:  No shortness of breath, productive cough, or hemoptysis.  CARDIOVASCULAR:  No chest pain, angina, or orthopnea.  GU:  No urinary frequency, discharge, or retention.  GI:  No nausea, vomiting.  The patient does note episodes of diarrhea  and constipation.  Previous history of melena but none recent.  PHYSICAL EXAMINATION:  VITAL SIGNS:  Pulse is 90, respiratory 18, BP 160/82, weight is 226, height 6 feet 2 inches. GENERAL:  This is a healthy gentleman sitting upright in no acute distress.  He does walk with antalgic gait with a varus thrust. HEENT:  Atraumatic, normocephalic.  Pupils equal, round, and reactive. NECK:  Supple.  No lymphadenopathy. CHEST:  Clear to auscultation bilaterally. HEART:  Regular rate and rhythm.  No murmurs, gallops, or rubs. ABDOMEN:  Soft, nontender. SKIN:  No rashes or lesions are noted. EXTREMITIES:  In regard to the knee, he does have a varus deformity.  He is tender along the medial joint line.  There is trace effusion.  Range of motion is -5 to 130 degrees.  No instability with varus and valgus stressing.  Negative anterior posterior drawer.  Calves soft and nontender.  IMPRESSION:  Degenerative joint disease right knee.  PLAN:  The patient will be admitted to [facility] to undergo a right total knee arthroplasty.     Roma Schanz,  P.A.   ______________________________ Jene Every, M.D.    CS/MEDQ  D:  03/30/2010  T:  03/31/2010  Job:  [id number]  Electronically Signed by Roma Schanz P.A. on 04/09/2010 11:05:56 AM Electronically Signed by Jene Every M.D. on 04/12/2010 09:19:34 AM

## 2010-10-14 DIAGNOSIS — M1990 Unspecified osteoarthritis, unspecified site: Secondary | ICD-10-CM

## 2010-10-14 HISTORY — DX: Unspecified osteoarthritis, unspecified site: M19.90

## 2010-10-17 ENCOUNTER — Other Ambulatory Visit: Payer: Self-pay | Admitting: Specialist

## 2010-10-17 ENCOUNTER — Ambulatory Visit (HOSPITAL_COMMUNITY)
Admission: RE | Admit: 2010-10-17 | Discharge: 2010-10-17 | Disposition: A | Discharge status: Home / Self Care | Payer: Medicare Other | Source: Ambulatory Visit | Attending: Specialist | Admitting: Specialist

## 2010-10-17 ENCOUNTER — Encounter (HOSPITAL_COMMUNITY): Payer: Medicare Other

## 2010-10-17 ENCOUNTER — Other Ambulatory Visit (HOSPITAL_COMMUNITY): Payer: Self-pay | Admitting: Specialist

## 2010-10-17 DIAGNOSIS — M1710 Unilateral primary osteoarthritis, unspecified knee: Secondary | ICD-10-CM | POA: Insufficient documentation

## 2010-10-17 DIAGNOSIS — Z01818 Encounter for other preprocedural examination: Secondary | ICD-10-CM | POA: Insufficient documentation

## 2010-10-17 DIAGNOSIS — Z01812 Encounter for preprocedural laboratory examination: Secondary | ICD-10-CM | POA: Insufficient documentation

## 2010-10-17 DIAGNOSIS — M25569 Pain in unspecified knee: Secondary | ICD-10-CM | POA: Insufficient documentation

## 2010-10-17 LAB — CBC
HCT: 39.8 % (ref 39.0–52.0)
Platelets: 158 10*3/uL (ref 150–400)
RDW: 14.5 % (ref 11.5–15.5)
WBC: 6.1 10*3/uL (ref 4.0–10.5)

## 2010-10-17 LAB — COMPREHENSIVE METABOLIC PANEL
ALT: 19 U/L (ref 0–53)
AST: 15 U/L (ref 0–37)
Albumin: 3.8 g/dL (ref 3.5–5.2)
CO2: 24 mEq/L (ref 19–32)
Calcium: 10.1 mg/dL (ref 8.4–10.5)
Sodium: 136 mEq/L (ref 135–145)
Total Protein: 8 g/dL (ref 6.0–8.3)

## 2010-10-17 LAB — URINALYSIS, ROUTINE W REFLEX MICROSCOPIC
Glucose, UA: NEGATIVE mg/dL
Leukocytes, UA: NEGATIVE
Protein, ur: NEGATIVE mg/dL
pH: 6 (ref 5.0–8.0)

## 2010-10-17 LAB — SURGICAL PCR SCREEN: Staphylococcus aureus: NEGATIVE

## 2010-10-17 LAB — DIFFERENTIAL
Eosinophils Relative: 3 % (ref 0–5)
Lymphocytes Relative: 28 % (ref 12–46)
Monocytes Relative: 7 % (ref 3–12)
Neutrophils Relative %: 61 % (ref 43–77)

## 2010-10-17 LAB — APTT: aPTT: 39 seconds — ABNORMAL HIGH (ref 24–37)

## 2010-10-26 ENCOUNTER — Inpatient Hospital Stay (HOSPITAL_COMMUNITY)
Admission: RE | Admit: 2010-10-26 | Discharge: 2010-10-29 | DRG: 470 | Disposition: A | Discharge status: Home Health | Payer: Medicare Other | Source: Ambulatory Visit | Attending: Specialist | Admitting: Specialist

## 2010-10-26 ENCOUNTER — Inpatient Hospital Stay (HOSPITAL_COMMUNITY): Payer: Medicare Other

## 2010-10-26 DIAGNOSIS — I2510 Atherosclerotic heart disease of native coronary artery without angina pectoris: Secondary | ICD-10-CM | POA: Diagnosis present

## 2010-10-26 DIAGNOSIS — M1710 Unilateral primary osteoarthritis, unspecified knee: Principal | ICD-10-CM | POA: Diagnosis present

## 2010-10-26 DIAGNOSIS — I10 Essential (primary) hypertension: Secondary | ICD-10-CM | POA: Diagnosis present

## 2010-10-26 DIAGNOSIS — Z01812 Encounter for preprocedural laboratory examination: Secondary | ICD-10-CM

## 2010-10-26 DIAGNOSIS — Z8249 Family history of ischemic heart disease and other diseases of the circulatory system: Secondary | ICD-10-CM

## 2010-10-26 DIAGNOSIS — R269 Unspecified abnormalities of gait and mobility: Secondary | ICD-10-CM | POA: Diagnosis present

## 2010-10-26 DIAGNOSIS — M21169 Varus deformity, not elsewhere classified, unspecified knee: Secondary | ICD-10-CM | POA: Diagnosis present

## 2010-10-26 DIAGNOSIS — E119 Type 2 diabetes mellitus without complications: Secondary | ICD-10-CM | POA: Diagnosis present

## 2010-10-26 DIAGNOSIS — Z79899 Other long term (current) drug therapy: Secondary | ICD-10-CM

## 2010-10-26 DIAGNOSIS — Z951 Presence of aortocoronary bypass graft: Secondary | ICD-10-CM

## 2010-10-26 DIAGNOSIS — Z7982 Long term (current) use of aspirin: Secondary | ICD-10-CM

## 2010-10-26 DIAGNOSIS — E7800 Pure hypercholesterolemia, unspecified: Secondary | ICD-10-CM | POA: Diagnosis present

## 2010-10-26 HISTORY — PX: TOTAL KNEE ARTHROPLASTY: SHX125

## 2010-10-26 LAB — TYPE AND SCREEN: ABO/RH(D): A POS

## 2010-10-26 LAB — GLUCOSE, CAPILLARY: Glucose-Capillary: 132 mg/dL — ABNORMAL HIGH (ref 70–99)

## 2010-10-26 LAB — ABO/RH: ABO/RH(D): A POS

## 2010-10-27 LAB — CBC
Hemoglobin: 12 g/dL — ABNORMAL LOW (ref 13.0–17.0)
MCH: 25.1 pg — ABNORMAL LOW (ref 26.0–34.0)
MCV: 77.5 fL — ABNORMAL LOW (ref 78.0–100.0)
RBC: 4.79 MIL/uL (ref 4.22–5.81)
WBC: 9.9 10*3/uL (ref 4.0–10.5)

## 2010-10-27 LAB — GLUCOSE, CAPILLARY
Glucose-Capillary: 147 mg/dL — ABNORMAL HIGH (ref 70–99)
Glucose-Capillary: 155 mg/dL — ABNORMAL HIGH (ref 70–99)

## 2010-10-27 LAB — BASIC METABOLIC PANEL
CO2: 25 mEq/L (ref 19–32)
Calcium: 9 mg/dL (ref 8.4–10.5)
Chloride: 99 mEq/L (ref 96–112)
Creatinine, Ser: 0.7 mg/dL (ref 0.50–1.35)
Glucose, Bld: 156 mg/dL — ABNORMAL HIGH (ref 70–99)
Sodium: 132 mEq/L — ABNORMAL LOW (ref 135–145)

## 2010-10-28 LAB — GLUCOSE, CAPILLARY
Glucose-Capillary: 141 mg/dL — ABNORMAL HIGH (ref 70–99)
Glucose-Capillary: 135 mg/dL — ABNORMAL HIGH (ref 70–99)
Glucose-Capillary: 138 mg/dL — ABNORMAL HIGH (ref 70–99)

## 2010-10-28 LAB — CBC
Hemoglobin: 10.6 g/dL — ABNORMAL LOW (ref 13.0–17.0)
MCH: 25.6 pg — ABNORMAL LOW (ref 26.0–34.0)
MCHC: 34.2 g/dL (ref 30.0–36.0)
Platelets: 135 10*3/uL — ABNORMAL LOW (ref 150–400)
RDW: 14.5 % (ref 11.5–15.5)

## 2010-10-28 LAB — BASIC METABOLIC PANEL
Calcium: 9.2 mg/dL (ref 8.4–10.5)
GFR calc Af Amer: >60 mL/min (ref >60)
GFR calc non Af Amer: >60 mL/min (ref >60)
Glucose, Bld: 138 mg/dL — ABNORMAL HIGH (ref 70–99)
Potassium: 3.5 mEq/L (ref 3.5–5.1)
Sodium: 123 mEq/L — ABNORMAL LOW (ref 135–145)

## 2010-10-29 LAB — CBC
HCT: 30.3 % — ABNORMAL LOW (ref 39.0–52.0)
Hemoglobin: 10.1 g/dL — ABNORMAL LOW (ref 13.0–17.0)
MCH: 25.1 pg — ABNORMAL LOW (ref 26.0–34.0)
MCHC: 33.3 g/dL (ref 30.0–36.0)

## 2010-10-29 LAB — BASIC METABOLIC PANEL
BUN: 8 mg/dL (ref 6–23)
GFR calc non Af Amer: >60 mL/min (ref >60)
Glucose, Bld: 129 mg/dL — ABNORMAL HIGH (ref 70–99)
Potassium: 3.7 mEq/L (ref 3.5–5.1)

## 2010-11-03 NOTE — H&P (Signed)
NAMETREAVOR, BLOMQUIST NO.:  [id number]  MEDICAL RECORD NO.:  [id number]  LOCATION:                               FACILITY:  [facility]  PHYSICIAN:  Jene Every, M.D.    DATE OF BIRTH:  1941/01/10  DATE OF ADMISSION:  10/26/2010 DATE OF DISCHARGE:                             HISTORY & PHYSICAL   CHIEF COMPLAINT:  Right knee pain.  HISTORY:  [name] is a pleasant 70 year old gentleman, who has noted progressive right knee symptoms with some instability with walking.  He was originally scheduled earlier this year for a knee replacement, but had abnormal EKG findings, subsequently underwent a CABG.  He has now been cleared.  Patient does wish to go and proceed with total knee arthroplasty.  Repeat radiographs do reveal a tricompartmental changes, significant change on the medial compartment with a varus deformity.  The risks and benefits of surgery were discussed with the patient.  He does wish to proceed.  PAST MEDICAL HISTORY: 1. Coronary artery disease. 2. Type 2 diabetes. 3. Hypertension. 4. Hypercholesteremia. 5. Recent CABG.  CURRENT MEDICATIONS:  Include, 1. Aspirin 325 mg daily. 2. Losartan 100 mg daily. 3. Simvastatin 20 mg daily. 4. Metformin 500 mg b.i.d. 5. Carvedilol 1 p.o. b.i.d. at 6.25 mg. 6. Actavis 12.5 mg 1 p.o. daily.  ALLERGIES:  None.  PREVIOUS SURGICAL HISTORY:  Hernia repair approximately 20 years ago, CABG x5 in 2012.  FAMILY HISTORY:  Father deceased of prostate cancer at age 53.  Mother deceased of breast cancer at 50.  Brother at age 21 with a history of MI.  SOCIAL:  This patient is married.  He is retired.  He smoked approximately 35 years ago.  History negative for alcohol consumption. He has 2 children.  Family will be his caregiver following surgery, lives in a two-story home.  REVIEW OF SYSTEMS:  GENERAL:  The patient denies any fever, chills, night sweats, bleeding, or tendencies.  CNS:  No blurred or  double vision, seizure, headache, or paralysis.  RESPIRATORY:  No shortness of breath, productive cough, or hemoptysis.  CARDIOVASCULAR:  No chest pain or history of orthopnea.  GU:  No dysuria, hematuria, or discharge.  GI: No nausea, vomiting, diarrhea, constipation, or melena. MUSCULOSKELETAL:  As per in the HPI.  PHYSICAL EXAMINATION:  VITAL SIGNS:  Pulse is 65, respiratory 10, BP 151/80, height is 73 inches, weight 226. GENERAL:  This is a healthy gentleman seen upright, in no acute distress.  He does walk with antalgic gait with slight varus thrust. HEENT:  Atraumatic, normocephalic.  Pupils are equal, round, and reactive to light.  EOMs intact. NECK:  Supple.  No lymphadenopathy. CHEST:  Clear to auscultation bilaterally.  No rhonchi, wheezes, or rales. HEART:  Regular rate and rhythm with no murmurs, gallops, or rubs.  He has had a midline sternotomy incision, which is well healed. ABDOMEN:  Soft, nontender, nondistended. SKIN:  No rashes or lesions are noted. EXTREMITIES:  In regard to the knee, it does have mild effusion with again a varus thrust is found on the medial joint line, trace effusion. Range of motion 5 to 130 degrees.  No instability on varus and valgus  stressing at 0 to 30 degrees.  IMPRESSION:  Degenerative joint disease, right knee.  PLAN:  The patient will be admitted to [facility] to undergo a right total knee arthroplasty.     Roma Schanz, P.A.   ______________________________ Jene Every, M.D.    CS/MEDQ  D:  10/24/2010  T:  10/25/2010  Job:  [id number]  Electronically Signed by Roma Schanz P.A. on 10/26/2010 01:27:39 PM Electronically Signed by Jene Every M.D. on 11/03/2010 03:10:58 PM

## 2010-11-03 NOTE — Op Note (Signed)
[name], [name] NO.:  [id number]  MEDICAL RECORD NO.:  [id number]  LOCATION:  1605                         FACILITY:  [facility]  PHYSICIAN:  Jene Every, M.D.    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  PROCEDURE PERFORMED:  Right total knee arthroplasty.  ANESTHESIA:  General.  ASSISTANT:  Roma Schanz, PA.  COMPONENTS:  DePuy rotating platform 5 femur, 5 tibia, 10 mm insert, 38 patella.  HISTORY:  70 year old with endstage osteoarthrosis of the right knee refractory indicated for replacement to the degenerated joint.  Risks and benefits discussed, including bleeding, infection, damage to neurovascular structures, suboptimal range of motion, DVT, PE, need for revision, and postoperative course in detail.  TECHNIQUE:  The patient in supine beach-chair position, after induction of adequate anesthesia and 2 g of Kefzol, the right lower extremity was prepped and draped in the usual sterile fashion via a tourniquet inflated to 300 mmHg.  After exsanguination, a midline incision was made over the patella.  Full-thickness flaps developed, median and parapatellar arthrotomy was performed.  Patella everted and knee flexed. Tricompartmental osteoporosis was noted, bone-on-bone in the medial compartment.  Osteophytes removed with a rongeur.  We elevated soft tissues medially with a regular.  Removed the medial and lateral menisci remnants of the ACL.  Step drill utilized and the femoral canal was irrigated.  5 degrees right was placed, 11 off the distal femur due to the slight flexion contracture.  Throughout the case, we used a very meticulous saving of the posterior soft  tissues with a Meckel's; he does have a history of peripheral vascular disease.  11 off the lateral to distal femur, oscillating saw utilized to perform a cut.  This was incised off the anterior cortex of the femur as a 5, pinned in 3 degrees of external rotation and we performed anterior and posterior chamfer cuts, protecting the soft tissues at all times.  Attention was turned towards the tibia.  We subluxed the tibia with the Meckel's, placed an external alignment guide 10 off the high side which was lateral, bisecting the ankle joint parallel to the tibia.  Oscillating saw performed this cut without difficulty, protecting the soft tissues.  We then placed a block flexion and extension guide that were equivalent. Next, turned our attention back towards completing the tibia.  Tibia subluxed, knee flexed, soft tissues protected, sized to a 5.  Just in the medial aspect of the tibial tubercle, centering drill was utilized as was the punch guide.  Trial plate remained.  We turned our attention towards completing the femur, performed the box cut after the jig was applied.  Box cut was performed with a trial femur and tibia and 10-mm insert with full extension, full flexion, good stability varus and valgus stress 0 to 30 degrees.  Attention turned towards completion of the patella, measured a 25, size of 38.  We planed to a 16 utilizing the oscillating saw.  Three peg holes were applied with the patellar button medialized following the trial placement, with full extension and flexion and good patellofemoral tracking.  All trials were then removed. We checked posteriorly.  The popliteal was intact.  There were no osteophytes or remnants of menisci and  the capsule was intact. Copiously irrigated the wound with pulsatile lavage at low pressure. Knee was then flexed, surfaces were thoroughly dried.  Mixed cement on the back table in the appropriate fashion was injected into the tibial canal and  digitally pressurized.  Then impacted the 5 tray onto the tibia with redundant cement removed.  Then got the femur, placed cement on the femoral component and impacted into place with residual cement removed.  Placed a trial insert 10, held it in extension with axial load applied throughout the curing of the cement.  Then cemented the patellar component as well.  Next, at the appropriate curing of cement in full extension, full flexion, good stability, varus and valgus stressing 0 to 30 degrees to probe patellofemoral tracking.  Trial insert was removed. I inspected the joint, removed redundant cement, copiously irrigated with low-pressure pulsatile lavage.  Then placed a permanent 10-mm insert.  Again tested the full extension and flexion, negative anterior Drawer at flexion, good stability varus valgus stressing 0 to 30 degrees.  Therefore placed a Hemovac and brought it out through a lateral stab wound in the skin.  0.25% Marcaine with epinephrine was utilized to infiltrate the periosteum from the femur.  I repaired the patellar arthrotomy with #1 Vicryl interrupted figure-of-eight sutures, subcu with only 2-0 Vicryl simple sutures.  Skin was reapproximated with staples in slight flexion and flexion 90 degrees of gravity.  Sterile dressing applied.  Tourniquet was deflated and we had revascularization of the lower extremity appreciated.  We had pulse oximetry as well as Doppler-able pulses, dorsalis pedis and posterior tibial.  The patient tolerated the procedure well.  No complications.  Tourniquet time was 2 hours.     Jene Every, M.D.     Cordelia Pen  D:  10/26/2010  T:  10/26/2010  Job:  [id number]  Electronically Signed by Jene Every M.D. on 11/03/2010 03:11:00 PM

## 2010-11-10 NOTE — Discharge Summary (Signed)
NAMEJORDELL, OUTTEN NO.:  [id number]  MEDICAL RECORD NO.:  [id number]  LOCATION:  1605                         FACILITY:  [facility]  PHYSICIAN:  Jene Every, M.D.    DATE OF BIRTH:  21-Sep-1940  DATE OF ADMISSION:  10/26/2010 DATE OF DISCHARGE:  10/29/2010                              DISCHARGE SUMMARY   ADMISSION DIAGNOSES: 1. Degenerative joint disease, right knee. 2. Coronary artery disease, status post coronary artery bypass graft. 3. Type 2 diabetes. 4. Hypertension. 5. Hypercholesterolemia.  DISCHARGE DIAGNOSES: 1. Degenerative joint disease, right knee. 2. Coronary artery disease, status post coronary artery bypass graft. 3. Type 2 diabetes. 4. Hypertension. 5. Hypercholesterolemia. 6. Status post right total knee arthroplasty. 7. Asymptomatic postoperative hyponatremia.  HISTORY:  Mr. [name] is a pleasant 70 year old gentleman who has noted progressive right knee symptoms with instability with walking.  He was originally scheduled earlier this year; however, abnormal EKG findings were noted.  He subsequently underwent CABG.  He has now been cleared and does elect to proceed.  Radiographs do reveal tricompartmental osteoarthritis with bone-on-bone deformity noted along the medial compartment.  PROCEDURE:  The patient was taken to the OR and underwent right total knee arthroplasty.  SURGEON:  Jene Every, MD  ASSISTANT:  Roma Schanz, PA  ANESTHESIA:  Femoral block with sedation.  CONSULTS:  PT/OT, Care Management.  LABORATORY DATA:  Postoperative CBC showed white cell count 9.9, hemoglobin 12.0, hematocrit 37.1.  This was monitored throughout the hospital stay.  White cell count remained normal at 7.8, hemoglobin 10.1, hematocrit 30.3.  Routine chemistries done postoperatively showed sodium 132, potassium 4.1 with a normal BUN and creatinine.  This was monitored throughout the hospital stay.  Sodium did drop to 123,  fluid restrictions were implemented, at time of discharge improved to 127. Potassium remained stable at 3.7 with a normal BUN and creatinine. Chloride slightly decreased at 92.  GFR remained greater than 60.  HOSPITAL COURSE:  The patient was admitted, taken to the OR, and underwent the above-stated procedure without difficulty.  One Hemovac drain was placed intraoperatively.  He did have slightly diminished blood flow postoperatively, but with deflation of the tourniquet, this did improve.  He had good posterior tibialis pulse that was identified with Doppler.  On postop day#1, the patient was doing very well.  He noted minimal pain.  Denied any chest pain or shortness of breath. Vital signs were stable.  A slight increase in blood pressure secondary to slight anxiety.  Hemoglobin was stable at 12.0, slightly decreased sodium 132.  Hemovac drain was discontinued, tip was intact.  Motor neurovascular function intact in lower extremity.  He had good capillary refill, palpable pulses.  Xarelto was started per Pharmacy.  PT/OT was consulted.  Foley was discontinued.  PCA was weaned as well as oxygen discontinued.  Discharge planning was initiated.  On postop day #2, the patient continued to do fairly well.  Dressing was changed.  Incision was clean, dry, and intact.  Hemoglobin remained stable.  The patient continued with some hyponatremia down to 123.  Cherly Beach placed the patient on fluid restrictions.  The patient did have some elevated blood pressure, continue with blood pressure  medication as well as Ativan for control.  On postop day #3, the patient was doing significantly better.  Again, minimal pain.  No chest pain or shortness of breath.  Blood pressure was under better control, slightly febrile at 99.1, pulse of 102.  The patient was asymptomatic.  He was voiding, passing flatus without difficulty.  Incision remained clean, dry, and intact.  Motor and neurovascular function  was intact.  Calf soft, nontender.  Sodium remained decreased at 127, but was improving.  At this point, Dr. Shelle Iron felt the patient was stable to be discharged home.  DISPOSITION:  The patient is stable to be discharged home with home health needs met.  He is to follow with Dr. Shelle Iron in approximately 10 to 14 days for suture removal and x-ray.  ACTIVITIES:  Walk as tolerated.  Elevate the leg at least 6 times a day for at least 20 minutes at a time.  Use knee immobilizer until he can straight leg raise x10.  DIET:  As tolerated.  DISCHARGE MEDICATIONS:  As per med rec sheet.  He will need to be on Xarelto for a total of 21 days.  CONDITION ON DISCHARGE:  Stable.  FINAL DIAGNOSIS:  Status post right total knee arthroplasty.     Roma Schanz, P.A.   ______________________________ Jene Every, M.D.    CS/MEDQ  D:  11/03/2010  T:  11/03/2010  Job:  [id number]  Electronically Signed by Roma Schanz P.A. on 11/06/2010 12:14:42 PM Electronically Signed by Jene Every M.D. on 11/10/2010 01:52:44 PM

## 2011-02-13 DIAGNOSIS — Z5320 Procedure and treatment not carried out because of patient's decision for unspecified reasons: Secondary | ICD-10-CM | POA: Diagnosis not present

## 2011-02-13 DIAGNOSIS — R042 Hemoptysis: Secondary | ICD-10-CM | POA: Diagnosis not present

## 2011-02-14 DIAGNOSIS — R0989 Other specified symptoms and signs involving the circulatory and respiratory systems: Secondary | ICD-10-CM | POA: Diagnosis not present

## 2011-02-14 DIAGNOSIS — I10 Essential (primary) hypertension: Secondary | ICD-10-CM | POA: Diagnosis not present

## 2011-02-14 DIAGNOSIS — Z951 Presence of aortocoronary bypass graft: Secondary | ICD-10-CM | POA: Diagnosis not present

## 2011-02-14 DIAGNOSIS — E782 Mixed hyperlipidemia: Secondary | ICD-10-CM | POA: Diagnosis not present

## 2011-02-14 DIAGNOSIS — R05 Cough: Secondary | ICD-10-CM | POA: Diagnosis not present

## 2011-02-14 DIAGNOSIS — J209 Acute bronchitis, unspecified: Secondary | ICD-10-CM | POA: Diagnosis not present

## 2011-02-26 DIAGNOSIS — I10 Essential (primary) hypertension: Secondary | ICD-10-CM | POA: Diagnosis not present

## 2011-02-26 DIAGNOSIS — I2510 Atherosclerotic heart disease of native coronary artery without angina pectoris: Secondary | ICD-10-CM | POA: Diagnosis not present

## 2011-03-06 DIAGNOSIS — I10 Essential (primary) hypertension: Secondary | ICD-10-CM | POA: Diagnosis not present

## 2011-03-06 DIAGNOSIS — E782 Mixed hyperlipidemia: Secondary | ICD-10-CM | POA: Diagnosis not present

## 2011-03-06 DIAGNOSIS — J209 Acute bronchitis, unspecified: Secondary | ICD-10-CM | POA: Diagnosis not present

## 2011-04-17 DIAGNOSIS — Z86010 Personal history of colonic polyps: Secondary | ICD-10-CM | POA: Diagnosis not present

## 2011-04-30 DIAGNOSIS — Z09 Encounter for follow-up examination after completed treatment for conditions other than malignant neoplasm: Secondary | ICD-10-CM | POA: Diagnosis not present

## 2011-04-30 DIAGNOSIS — Z951 Presence of aortocoronary bypass graft: Secondary | ICD-10-CM | POA: Diagnosis not present

## 2011-04-30 DIAGNOSIS — I2510 Atherosclerotic heart disease of native coronary artery without angina pectoris: Secondary | ICD-10-CM | POA: Diagnosis not present

## 2011-04-30 DIAGNOSIS — Z860100 Personal history of colon polyps, unspecified: Secondary | ICD-10-CM | POA: Diagnosis not present

## 2011-04-30 DIAGNOSIS — I10 Essential (primary) hypertension: Secondary | ICD-10-CM | POA: Diagnosis not present

## 2011-04-30 DIAGNOSIS — K621 Rectal polyp: Secondary | ICD-10-CM | POA: Diagnosis not present

## 2011-04-30 DIAGNOSIS — Z79899 Other long term (current) drug therapy: Secondary | ICD-10-CM | POA: Diagnosis not present

## 2011-04-30 DIAGNOSIS — D126 Benign neoplasm of colon, unspecified: Secondary | ICD-10-CM | POA: Diagnosis not present

## 2011-04-30 DIAGNOSIS — K620 Anal polyp: Secondary | ICD-10-CM | POA: Diagnosis not present

## 2011-04-30 DIAGNOSIS — Z6827 Body mass index (BMI) 27.0-27.9, adult: Secondary | ICD-10-CM | POA: Diagnosis not present

## 2011-04-30 DIAGNOSIS — E669 Obesity, unspecified: Secondary | ICD-10-CM | POA: Diagnosis not present

## 2011-04-30 DIAGNOSIS — Z7982 Long term (current) use of aspirin: Secondary | ICD-10-CM | POA: Diagnosis not present

## 2011-04-30 DIAGNOSIS — E785 Hyperlipidemia, unspecified: Secondary | ICD-10-CM | POA: Diagnosis not present

## 2011-04-30 DIAGNOSIS — E119 Type 2 diabetes mellitus without complications: Secondary | ICD-10-CM | POA: Diagnosis not present

## 2011-04-30 DIAGNOSIS — K5730 Diverticulosis of large intestine without perforation or abscess without bleeding: Secondary | ICD-10-CM | POA: Diagnosis not present

## 2011-06-04 DIAGNOSIS — Z0000 Encounter for general adult medical examination without abnormal findings: Secondary | ICD-10-CM | POA: Diagnosis not present

## 2011-06-18 DIAGNOSIS — H04129 Dry eye syndrome of unspecified lacrimal gland: Secondary | ICD-10-CM | POA: Diagnosis not present

## 2011-06-18 DIAGNOSIS — H25019 Cortical age-related cataract, unspecified eye: Secondary | ICD-10-CM | POA: Diagnosis not present

## 2011-06-18 DIAGNOSIS — H2510 Age-related nuclear cataract, unspecified eye: Secondary | ICD-10-CM | POA: Diagnosis not present

## 2011-08-20 DIAGNOSIS — I10 Essential (primary) hypertension: Secondary | ICD-10-CM | POA: Diagnosis not present

## 2011-08-20 DIAGNOSIS — I2510 Atherosclerotic heart disease of native coronary artery without angina pectoris: Secondary | ICD-10-CM | POA: Diagnosis not present

## 2011-08-20 DIAGNOSIS — E782 Mixed hyperlipidemia: Secondary | ICD-10-CM | POA: Diagnosis not present

## 2011-09-03 DIAGNOSIS — E782 Mixed hyperlipidemia: Secondary | ICD-10-CM | POA: Diagnosis not present

## 2011-09-03 DIAGNOSIS — I10 Essential (primary) hypertension: Secondary | ICD-10-CM | POA: Diagnosis not present

## 2011-09-25 DIAGNOSIS — M1710 Unilateral primary osteoarthritis, unspecified knee: Secondary | ICD-10-CM | POA: Diagnosis not present

## 2011-10-29 DIAGNOSIS — I8310 Varicose veins of unspecified lower extremity with inflammation: Secondary | ICD-10-CM | POA: Diagnosis not present

## 2011-11-19 DIAGNOSIS — I8310 Varicose veins of unspecified lower extremity with inflammation: Secondary | ICD-10-CM | POA: Diagnosis not present

## 2011-12-10 DIAGNOSIS — Z125 Encounter for screening for malignant neoplasm of prostate: Secondary | ICD-10-CM | POA: Diagnosis not present

## 2011-12-10 DIAGNOSIS — I10 Essential (primary) hypertension: Secondary | ICD-10-CM | POA: Diagnosis not present

## 2012-03-10 DIAGNOSIS — E782 Mixed hyperlipidemia: Secondary | ICD-10-CM | POA: Diagnosis not present

## 2012-03-10 DIAGNOSIS — I10 Essential (primary) hypertension: Secondary | ICD-10-CM | POA: Diagnosis not present

## 2012-03-13 ENCOUNTER — Other Ambulatory Visit (HOSPITAL_COMMUNITY): Payer: Self-pay | Admitting: Cardiology

## 2012-03-13 DIAGNOSIS — Z951 Presence of aortocoronary bypass graft: Secondary | ICD-10-CM

## 2012-03-13 DIAGNOSIS — I2510 Atherosclerotic heart disease of native coronary artery without angina pectoris: Secondary | ICD-10-CM

## 2012-03-25 ENCOUNTER — Ambulatory Visit (HOSPITAL_COMMUNITY)
Admission: RE | Admit: 2012-03-25 | Discharge: 2012-03-25 | Disposition: A | Discharge status: Home / Self Care | Payer: Medicare Other | Source: Ambulatory Visit | Attending: Cardiology | Admitting: Cardiology

## 2012-03-25 ENCOUNTER — Encounter (HOSPITAL_COMMUNITY): Payer: Self-pay | Admitting: Cardiology

## 2012-03-25 DIAGNOSIS — I2510 Atherosclerotic heart disease of native coronary artery without angina pectoris: Secondary | ICD-10-CM

## 2012-03-25 DIAGNOSIS — E119 Type 2 diabetes mellitus without complications: Secondary | ICD-10-CM | POA: Diagnosis not present

## 2012-03-25 DIAGNOSIS — R9430 Abnormal result of cardiovascular function study, unspecified: Secondary | ICD-10-CM | POA: Diagnosis not present

## 2012-03-25 DIAGNOSIS — I10 Essential (primary) hypertension: Secondary | ICD-10-CM | POA: Diagnosis present

## 2012-03-25 DIAGNOSIS — R931 Abnormal findings on diagnostic imaging of heart and coronary circulation: Secondary | ICD-10-CM | POA: Diagnosis present

## 2012-03-25 DIAGNOSIS — E1169 Type 2 diabetes mellitus with other specified complication: Secondary | ICD-10-CM | POA: Diagnosis present

## 2012-03-25 DIAGNOSIS — E118 Type 2 diabetes mellitus with unspecified complications: Secondary | ICD-10-CM | POA: Diagnosis present

## 2012-03-25 DIAGNOSIS — Z951 Presence of aortocoronary bypass graft: Secondary | ICD-10-CM | POA: Diagnosis not present

## 2012-03-25 DIAGNOSIS — E669 Obesity, unspecified: Secondary | ICD-10-CM | POA: Diagnosis present

## 2012-03-25 DIAGNOSIS — I1A0 Resistant hypertension: Secondary | ICD-10-CM | POA: Diagnosis present

## 2012-03-25 HISTORY — PX: NM MYOVIEW LTD: HXRAD82

## 2012-03-25 MED ORDER — TECHNETIUM TC 99M SESTAMIBI GENERIC - CARDIOLITE
30.0000 | Freq: Once | INTRAVENOUS | Status: AC | PRN
  Administered 2012-03-25: 30 via INTRAVENOUS

## 2012-03-25 MED ORDER — TECHNETIUM TC 99M SESTAMIBI GENERIC - CARDIOLITE
10.0000 | Freq: Once | INTRAVENOUS | Status: AC | PRN
  Administered 2012-03-25: 10 via INTRAVENOUS

## 2012-03-25 NOTE — Procedures (Addendum)
[facility] [address] [place] [address] [phone]  Cardiology Nuclear Med Study  [name] is a 72 y.o. male     MRN : [id number]     DOB: August 31, 1940  Procedure Date: 03/25/2012  Nuclear Med Background Indication for Stress Test:  Graft Patency and Abnormal EKG History:  CABG x5 05/2010 Cardiac Risk Factors: History of Smoking, Hypertension, Lipids, NIDDM and Overweight  Symptoms:  none   Nuclear Pre-Procedure Caffeine/Decaff Intake:  1:00am NPO After: 11:00am   IV Site: R Antecubital  IV 0.9% NS with Angio Cath:  22g  Chest Size (in):  48 IV Started by: Emmit Pomfret, RN  Height: 6\' 2"  (1.88 m)  Cup Size: n/a  BMI:  Body mass index is 27.98 kg/(m^2). Weight:  218 lb (98.884 kg)   Tech Comments:  Patient held carvedilol 12 hrs prior to testing    Nuclear Med Study 1 or 2 day study: 1 day  Stress Test Type:  Stress  Order Authorizing Provider:  Bryan Lemma, MD   Resting Radionuclide: Technetium 54m Sestamibi  Resting Radionuclide Dose: 11.0 mCi   Stress Radionuclide:  Technetium 42m Sestamibi  Stress Radionuclide Dose: 30.7 mCi           Stress Protocol Rest HR: 78 Stress HR: 155  Rest BP: 154/90 Stress BP: 218/108  Exercise Time (min): 6:30 METS: 7.0   Predicted Max HR: 151 bpm % Max HR: 102.65 bpm Rate Pressure Product: 01027  Dose of Adenosine (mg):  n/a Dose of Lexiscan: n/a mg  Dose of Atropine (mg): n/a Dose of Dobutamine: n/a mcg/kg/min (at max HR)  Stress Test Technologist: Esperanza Sheets, CCT Nuclear Technologist: Koren Shiver, CNMT   Rest Procedure:  Myocardial perfusion imaging was performed at rest 45 minutes following the intravenous administration of Technetium 23m Sestamibi. Stress Procedure:  The patient performed treadmill exercise using a Bruce  Protocol for 6:30 minutes. The patient stopped due to Fatigue and SOB and denied any chest pain.  There were ST-T wave changes and  changes suggestive of LBBB.  Technetium 66m Sestamibi was injected at peak exercise and myocardial perfusion imaging was performed after a brief delay.  Transient Ischemic Dilatation (Normal <1.22):  0.83 Lung/Heart Ratio (Normal <0.45):  0.45 QGS EDV:  115 ml QGS ESV:  77 ml LV Ejection Fraction: 33%  Signed by .     Rest ECG: NSR with small inferolateral Q waves and incomplete LBBB.  Stress ECG: Positive for ischemia with 3-73mm of horizontal to downsloping ST segment depression inferolaterally with PAC's.  QPS Raw Data Images:  Normal; no motion artifact; normal heart/lung ratio. Stress Images:  Large in size, moderalely severe in intensity inferior to inferolateral ,lateral inferior defect from apex to base. Rest Images:  Focal apical scar and upper mid to basal inferior scar Subtraction (SDS): Scar apically and in the upper mid to basal inferior wall with significant ischemia in the distal to mid inferior and lateral wall with peri-infart ischemia in the basal lateral wall.(Extent 39%)  Impression Exercise Capacity:  Good exercise capacity. BP Response:  Hypertensive blood pressure response. Clinical Symptoms:  Mild shortness of breath ECG Impression:  Significant ST abnormalities consistent with ischemia. Comparison with Prior Nuclear Study: No significant change from previous study done prior to the patient's CABG revasulariztion.  Overall Impression:  High risk exercise myoview scan demonstating scar involving the apex and upper mid to basal inferior wall with significant additional ischemia inferiorly to  lateral-inferiorly from apex to base.  LV Wall Motion:  Severe LV dysfunction with infero-apical dyskinesis and hypocontractility involving the inferior and lateral wall; EF 33%.  Recommend cardiac catheterization.   Lennette Bihari, MD  03/25/2012 5:34 PM

## 2012-03-25 NOTE — H&P (Addendum)
Patient ID: [name] MRN: [id number], DOB/AGE: July 18, 1940   Admit date: (Not on file)   Primary Physician: Toma Deiters, MD Primary Cardiologist: Dr Herbie Baltimore  HPI:   72 y/o male, retired from [employer] after 72yrs, we saw in February 2012 as a pre op clearance prior to TKR. A Myoview was done and waas abnormal leading to cardiac cath and subsequent CABG X 5 with LIMA-LAD, SVG-RCA, SVG-RI, SVG-OM1 and OM2. Echo done Aug 2012 showed his EF to be >55%. He had no anginal symptoms pre CABG. Dr Herbie Baltimore saw him this past summer and he was doing well. Myoview was done today and this again show significant ant/lat ischemia with significant ST depression with exercise. He says he has not had any unusual chest pain or SOB. Dr Tresa Endo reviewed the scan and felt it was high risk and he is to be set up for an OP cath with Dr Herbie Baltimore.   Problem List: Past Medical History  Diagnosis Date  . Diabetes type 2, controlled   . HTN (hypertension)   . CAD (coronary artery disease)     CABG X 18 May 2010  . Dyslipidemia   . DJD (degenerative joint disease) 9/12    Rt TKR    Past Surgical History  Procedure Laterality Date  . Coronary artery bypass graft  4/12    x 5  . Total knee arthroplasty  9/12    Rt  . Umbilical hernia repair       Allergies: No Known Allergies   Home Medications No prescriptions prior to admission  See Med Rec2   No family history on file.   History   Social History  . Marital Status: Married    Spouse Name: N/A    Number of Children: N/A  . Years of Education: N/A   Occupational History  . Not on file.   Social History Main Topics  . Smoking status: Never Smoker   . Smokeless tobacco: Not on file  . Alcohol Use: No  . Drug Use: No  . Sexually Active: Not on file   Other Topics Concern  . Not on file   Social History Narrative  . No narrative on file     Review of Systems: General: negative for chills, fever, night sweats or weight  changes.  Cardiovascular: negative for chest pain, dyspnea on exertion, edema, orthopnea, palpitations, paroxysmal nocturnal dyspnea or shortness of breath Dermatological: negative for rash Respiratory: negative for cough or wheezing Urologic: negative for hematuria Abdominal: negative for nausea, vomiting, diarrhea, bright red blood per rectum, melena, or hematemesis Neurologic: negative for visual changes, syncope, or dizziness All other systems reviewed and are otherwise negative except as noted above.  Physical Exam: There were no vitals taken for this visit.  General appearance: alert, cooperative, no distress and mildly obese Neck: no carotid bruit and no JVD Lungs: clear to auscultation bilaterally Heart: regular rate and rhythm, S1, S2 normal, no murmur, click, rub or gallop Abdomen: soft, non-tender; bowel sounds normal; no masses,  no organomegaly Extremities: extremities normal, atraumatic, no cyanosis or edema Pulses: 2+ and symmetric Skin: Skin color, texture, turgor normal. No rashes or lesions Neurologic: Grossly normal    Labs:  No results found for this or any previous visit (from the past 24 hour(s)).   Radiology/Studies: No results found.  EKG:NSR  ASSESSMENT AND PLAN:  Principal Problem:   Abnormal nuclear cardiac imaging , 03/25/12 Active Problems:   CAD (coronary artery disease), CABG X 5 4/12  HTN (hypertension)   Dyslipidemia   Type 2 DM   Obesity, unspecified  Plan- Pt was seen by Dr Tresa Endo and myself in the office, plan is for OP cath with Dr Herbie Baltimore.  Deland Pretty, PA-C 03/25/2012, 5:00 PM   Rest ECG: NSR with small inferolateral Q waves and incomplete  LBBB.  Stress ECG: Positive for ischemia with 3-16mm of horizontal to  downsloping ST segment depression inferolaterally with PAC's.  QPS Raw Data Images: Normal; no motion artifact; normal heart/lung  ratio. Stress Images: Large in size, moderalely severe in intensity  inferior  to inferolateral ,lateral inferior defect from apex to  base. Rest Images: Focal apical scar and upper mid to basal inferior  scar Subtraction (SDS): Scar apically and in the upper mid to basal  inferior wall with significant ischemia in the distal to mid  inferior and lateral wall with peri-infart ischemia in the basal  lateral wall.(Extent 39%)  Impression Exercise Capacity: Good exercise capacity. BP Response: Hypertensive blood pressure response. Clinical Symptoms: Mild shortness of breath ECG Impression: Significant ST abnormalities consistent with  ischemia. Comparison with Prior Nuclear Study: No significant change from  previous study done prior to the patient's CABG revasulariztion.  Overall Impression: High risk exercise myoview scan demonstating  scar involving the apex and upper mid to basal inferior wall with  significant additional ischemia inferiorly to lateral-inferiorly  from apex to base.  LV Wall Motion: Severe LV dysfunction with infero-apical  dyskinesis and hypocontractility involving the inferior and  lateral wall; EF 33%. Recommend cardiac catheterization.  Patient seen and examined. Agree with assessment and plan. My review of nuclear study as noted above.  Suspect graft occlusion contributing to extensive ischemia. Discussed cath with patient and wife. Pt had never experienced chest pain even prior to CABG. Plan cath with Dr. Herbie Baltimore early next week.   Lennette Bihari, MD, [facility] 03/27/2012 9:12 AM   I have seen and evaluated the patient this AM I agree with Mr. Diona Fanti & DR. Landry Dyke findings, examination as well as impression recommendations.  Essentially asymptomatic pt with prior CABG & High Risk Myoview ST.  REferred for cardiac catheterization.  Performing MD:  Marykay Lex, M.D., M.S.  Procedure:  Left Heart Catheterization with Native and Graft Angiography +/- Percutaneous Coronary Intervention.  The procedure with  Risks/Benefits/Alternatives and Indications was reviewed with the patient.  All questions were answered.    Risks / Complications include, but not limited to: Death, MI, CVA/TIA, VF/VT (with defibrillation), Bradycardia (need for temporary pacer placement), contrast induced nephropathy, bleeding / bruising / hematoma / pseudoaneurysm, vascular or coronary injury (with possible emergent CT or Vascular Surgery), adverse medication reactions, infection.    The patient (and family) voice understanding and agree to proceed.   I have signed the consent form and placed it on the chart for patient signature and RN witness.     Marykay Lex, M.D., M.S. [facility] [address] [place], [place]  [address]  [phone]  04/02/2012 9:03 AM

## 2012-03-26 ENCOUNTER — Encounter (HOSPITAL_COMMUNITY): Payer: Self-pay | Admitting: Pharmacy Technician

## 2012-03-28 ENCOUNTER — Other Ambulatory Visit (HOSPITAL_COMMUNITY): Payer: Self-pay | Admitting: Cardiovascular Disease

## 2012-03-28 ENCOUNTER — Ambulatory Visit (HOSPITAL_COMMUNITY)
Admission: RE | Admit: 2012-03-28 | Discharge: 2012-03-28 | Disposition: A | Discharge status: Home / Self Care | Payer: Medicare Other | Source: Ambulatory Visit | Attending: Cardiovascular Disease | Admitting: Cardiovascular Disease

## 2012-03-28 DIAGNOSIS — Z951 Presence of aortocoronary bypass graft: Secondary | ICD-10-CM

## 2012-03-28 DIAGNOSIS — Z01818 Encounter for other preprocedural examination: Secondary | ICD-10-CM | POA: Insufficient documentation

## 2012-03-31 DIAGNOSIS — I2510 Atherosclerotic heart disease of native coronary artery without angina pectoris: Secondary | ICD-10-CM | POA: Diagnosis not present

## 2012-03-31 DIAGNOSIS — D689 Coagulation defect, unspecified: Secondary | ICD-10-CM | POA: Diagnosis not present

## 2012-03-31 DIAGNOSIS — Z01818 Encounter for other preprocedural examination: Secondary | ICD-10-CM | POA: Diagnosis not present

## 2012-03-31 DIAGNOSIS — E782 Mixed hyperlipidemia: Secondary | ICD-10-CM | POA: Diagnosis not present

## 2012-03-31 DIAGNOSIS — Z79899 Other long term (current) drug therapy: Secondary | ICD-10-CM | POA: Diagnosis not present

## 2012-04-01 ENCOUNTER — Other Ambulatory Visit: Payer: Self-pay | Admitting: Cardiology

## 2012-04-02 ENCOUNTER — Encounter (HOSPITAL_COMMUNITY): Payer: Self-pay | Admitting: Cardiology

## 2012-04-02 ENCOUNTER — Encounter (HOSPITAL_COMMUNITY)
Admission: RE | Disposition: A | Discharge status: Home / Self Care | Payer: Self-pay | Source: Ambulatory Visit | Attending: Cardiology

## 2012-04-02 ENCOUNTER — Ambulatory Visit (HOSPITAL_COMMUNITY)
Admission: RE | Admit: 2012-04-02 | Discharge: 2012-04-02 | Disposition: A | Discharge status: Home / Self Care | Payer: Medicare Other | Source: Ambulatory Visit | Attending: Cardiology | Admitting: Cardiology

## 2012-04-02 DIAGNOSIS — I25810 Atherosclerosis of coronary artery bypass graft(s) without angina pectoris: Secondary | ICD-10-CM | POA: Diagnosis not present

## 2012-04-02 DIAGNOSIS — I2582 Chronic total occlusion of coronary artery: Secondary | ICD-10-CM | POA: Diagnosis not present

## 2012-04-02 DIAGNOSIS — Z951 Presence of aortocoronary bypass graft: Secondary | ICD-10-CM | POA: Diagnosis not present

## 2012-04-02 DIAGNOSIS — R931 Abnormal findings on diagnostic imaging of heart and coronary circulation: Secondary | ICD-10-CM | POA: Diagnosis present

## 2012-04-02 DIAGNOSIS — I2510 Atherosclerotic heart disease of native coronary artery without angina pectoris: Secondary | ICD-10-CM | POA: Diagnosis not present

## 2012-04-02 DIAGNOSIS — I10 Essential (primary) hypertension: Secondary | ICD-10-CM | POA: Insufficient documentation

## 2012-04-02 DIAGNOSIS — R9439 Abnormal result of other cardiovascular function study: Secondary | ICD-10-CM | POA: Diagnosis not present

## 2012-04-02 DIAGNOSIS — E119 Type 2 diabetes mellitus without complications: Secondary | ICD-10-CM | POA: Diagnosis not present

## 2012-04-02 DIAGNOSIS — E785 Hyperlipidemia, unspecified: Secondary | ICD-10-CM | POA: Diagnosis not present

## 2012-04-02 DIAGNOSIS — R9430 Abnormal result of cardiovascular function study, unspecified: Secondary | ICD-10-CM | POA: Diagnosis not present

## 2012-04-02 DIAGNOSIS — E669 Obesity, unspecified: Secondary | ICD-10-CM | POA: Insufficient documentation

## 2012-04-02 DIAGNOSIS — I2584 Coronary atherosclerosis due to calcified coronary lesion: Secondary | ICD-10-CM | POA: Insufficient documentation

## 2012-04-02 HISTORY — DX: Unspecified osteoarthritis, unspecified site: M19.90

## 2012-04-02 HISTORY — DX: Hyperlipidemia, unspecified: E78.5

## 2012-04-02 HISTORY — DX: Type 2 diabetes mellitus without complications: E11.9

## 2012-04-02 HISTORY — PX: LEFT HEART CATHETERIZATION WITH CORONARY ANGIOGRAM: SHX5451

## 2012-04-02 LAB — GLUCOSE, CAPILLARY: Glucose-Capillary: 118 mg/dL — ABNORMAL HIGH (ref 70–99)

## 2012-04-02 SURGERY — LEFT HEART CATHETERIZATION WITH CORONARY ANGIOGRAM
Anesthesia: LOCAL

## 2012-04-02 MED ORDER — LIDOCAINE HCL (PF) 1 % IJ SOLN
INTRAMUSCULAR | Status: AC
  Filled 2012-04-02: qty 30

## 2012-04-02 MED ORDER — HYDRALAZINE HCL 20 MG/ML IJ SOLN: 10.0000 mg | INTRAMUSCULAR | Status: DC | PRN

## 2012-04-02 MED ORDER — LABETALOL HCL 5 MG/ML IV SOLN
INTRAVENOUS | Status: AC
  Filled 2012-04-02: qty 4

## 2012-04-02 MED ORDER — MIDAZOLAM HCL 2 MG/2ML IJ SOLN
INTRAMUSCULAR | Status: AC
  Filled 2012-04-02: qty 2

## 2012-04-02 MED ORDER — HEPARIN (PORCINE) IN NACL 2-0.9 UNIT/ML-% IJ SOLN
INTRAMUSCULAR | Status: AC
  Filled 2012-04-02: qty 1000

## 2012-04-02 MED ORDER — ACETAMINOPHEN 325 MG PO TABS: 650.0000 mg | ORAL_TABLET | ORAL | Status: DC | PRN

## 2012-04-02 MED ORDER — ONDANSETRON HCL 4 MG/2ML IJ SOLN: 4.0000 mg | Freq: Four times a day (QID) | INTRAMUSCULAR | Status: DC | PRN

## 2012-04-02 MED ORDER — HYDROCHLOROTHIAZIDE 12.5 MG PO CAPS: 12.5000 mg | ORAL_CAPSULE | Freq: Every day | ORAL | Status: DC

## 2012-04-02 MED ORDER — FENTANYL CITRATE 0.05 MG/ML IJ SOLN
INTRAMUSCULAR | Status: AC
  Filled 2012-04-02: qty 2

## 2012-04-02 MED ORDER — CARVEDILOL 12.5 MG PO TABS
12.5000 mg | ORAL_TABLET | Freq: Two times a day (BID) | ORAL | Status: DC
Start: 2012-04-02 — End: 2012-04-02

## 2012-04-02 MED ORDER — HYDRALAZINE HCL 20 MG/ML IJ SOLN
INTRAMUSCULAR | Status: AC
  Filled 2012-04-02: qty 1

## 2012-04-02 MED ORDER — SODIUM CHLORIDE 0.9 % IJ SOLN: 3.0000 mL | Freq: Two times a day (BID) | INTRAMUSCULAR | Status: DC

## 2012-04-02 MED ORDER — SODIUM CHLORIDE 0.9 % IV SOLN: 1.0000 mL/kg/h | INTRAVENOUS | Status: DC

## 2012-04-02 MED ORDER — MORPHINE SULFATE 2 MG/ML IJ SOLN: 2.0000 mg | INTRAMUSCULAR | Status: DC | PRN

## 2012-04-02 NOTE — CV Procedure (Signed)
[facility] CARDIAC CATHETERIZATION REPORT  NAME:  [name]   MRN: [id number] DOB:  [birth year]   ADMIT DATE: 04/02/2012 Procedure Date: 04/02/2012   INTERVENTIONAL CARDIOLOGIST: Marykay Lex, M.D., MS PRIMARY CARE PROVIDER: Toma Deiters, MD PRIMARY CARDIOLOGIST: Marykay Lex, M.D., MS  PATIENT:  [name] is a 72 y.o. male seen initially in February 2012 as a pre op clearance prior to TKR. A Myoview was done and waas abnormal leading to cardiac cath and subsequent CABG X 5 with LIMA-LAD, SVG-RCA, SVG-Ramus Intermedius (RI), SVG-OM1 and OM2. Echo done Aug 2012 showed his EF to be >55%. He had no anginal symptoms pre CABG.  a routine Myoview was a few weeks ago that demonstrated significant ant/lat ischemia with significant ST depression with exercise. He says he has not had any unusual chest pain or SOB.  PRE-OPERATIVE DIAGNOSIS:    High Risk Myoview ST - Inferolateral Ischemia  History of known CAD - CABG, DM, HTN & HLD.  PROCEDURES PERFORMED:    LEFT HEART CATHETERIZATION WITH NATIVE CORONARY ANGIOGRAPHY  IMA AND SVG GRAFT ANGIOGRAPHY  LEFT VENTRICULOGRAPHY  AORTIC ROOT ANGIOGRAPHY  PROCEDURE:Consent:  Risks of procedure as well as the alternatives and risks of each were explained to the (patient/caregiver).  Consent for procedure obtained. Consent for signed by MD and patient with RN witness -- placed on chart.  PROCEDURE: The patient was brought to the 2nd Floor [facility] in the fasting state and prepped and draped in the usual sterile fashion for Right groin access. Sterile technique was used including antiseptics, cap, gloves, gown, hand hygiene, mask and sheet.  Skin prep: Chlorhexidine.  Time Out: Verified patient identification, verified procedure, site/side was marked, verified correct patient position, special equipment/implants available, medications/allergies/relevent history reviewed, required  imaging and test results available.  Performed  Access: Right Common Femoral Artery; 5 Fr Sheath; fluoroscopically guided Modified Seldinger's Technique. Diagnostic:  TIG 4.0, (JL 3.5, JR 4, Angled Pigtail)  Left Coronary Artery Angiography: JL4  Right Coronary Artery Angiography: JR4  LIMA Angiography - JR4 to Subclavian, IMA catheter for Angiography  SVG-Ramus Intermedius Angiography: JR4  SVG-OM -- JR4, RCB, MPA2 all unsuccessful, AL1 engaged a totally occluded vessel  SVG-RCA -- JR4, RCB,LCB, MPA2, AL1, AR2 all unsuccessful; Aortic Root Angiography did not show grafts  LV Hemodynamics (LV Gram) & Aortic Root Angiography:  ANESTHESIA:   Local Lidocaine 18 ml SEDATION:  2 mg IV Versed, 100 mcg IV fentanyl ; Premedication: 5 mg PO Valium MEDICATIONS: Hydralazine 10 ml, Labetalol 20 mg  Omnipaque contrast:  Hemodynamics:  Central Aortic / Mean Pressures: 150/10 mmHg; 17 mmHg  Left Ventricular Pressures / EDP: 153/77 mmHg; 107 mmHg  Left Ventriculography:  EF: ~45 %  Wall Motion: Basal inferior & inferolateral hypokinesis  Coronary Anatomy:  Left Main: Large-caliber vessel with mild calcification; trifurcates distally into the LAD, Circumflex, and Ramus Intermedius. LAD: Angiographically moderate caliber vessel with a focal 80% proximal stenosis followed by a sequential 78% stenosis before the vessel is essentially occluded in the mid vessel after giving rise to a septal perforator.  The downstream LAD beyond the LIMA is subtotally occluded with a long tubular 90% stenosis with diffuse distal disease beyond this lesion. The LAD itself actually wraps around the apex beyond lesion Left Circumflex: Large-caliber vessel with a mid 99%, likely calcified focal stenosis just at the takeoff of OM1 that is a moderate caliber vessel. Beyond this the vessel courses down as a AV groove  circumflex. The previously visualized OM 2 is not visualized.  OM1: Is a major lateral branch  that courses along the inferolateral wall. Ramus intermedius: Moderate caliber vessel, initially described as an obtuse marginal. There is proximal stenosis before the vessel bifurcates into a smaller caliber anterolateral branch (diagonal) and OM branch that is subtotally occluded.  This vessel fills downstream via a patent vein graft.   RCA:  100 occluded in the mid vessel after diffuse proximal lesions. There is similar left to right collateral flow filling the posterior lateral and posterior ascending artery system as seen in his preoperative catheterization.   Graft Anatomy:  LIMA-LAD: Very tortuous what, widely patent graft with a takeoff quite far down the subclavian artery. The vessel inserts into the mid LAD with minimal retrograde flow. Antegrade flow to as described above.  SVG-RCA: Unable to engage, not visualized via nonselective aortic root angiography. This most likely indicates 100%/flush aorto-ostial occlusion, supported by the presence of persistent left to right collaterals.   SVG-Ramus Intermedius (RI): Widely patent until the distal anastomotic segment where it tapers to a roughly 80-90% stenosis. There is however brisk flow distally.  SVG-OM1-OM 2 (sequential): Adequately visualized with 100% aorto-ostial stenosis.  EBL:   < 10 ml  PATIENT DISPOSITION:    The patient was transferred to the PACU holding area in a hemodynamicaly stable, chest pain free condition.  The patient tolerated the procedure well, and there were no complications.  The patient was stable before, during, and after the procedure.  POST-OPERATIVE DIAGNOSIS:    Severe native CAD as noted preoperatively, including severe proximal LAD followed by 100% stenosis, 100% RCA, severe sequential lesions in the Circumflex-OM system as well as the RI.  2 of 4 grafts occluded with significant anastomotic stenosis of the SVG-RI leaving only the widely patent LIMA-LAD graft that perfuses a diffusely diseased  distal vessel.  Mild moderately reduced Ejection Fraction  PLAN OF CARE:  Standard post catheterization care. The patient will he be discharged later on today after adequate blood pressure control for sheath removal.   Continue medical therapy for now. Will review angiography with my interventional partners to determine the best interventional course of action.  Potential PCI options include: Anastomotic lesion of the SVG-RI, and native Circumflex-OM that may require Rotablator therapy.  Patient will followup with me as scheduled. Will contact him in sooner if any additional plans are made for intervention.    Marykay Lex, M.D., M.S. [facility] [address] [place], [place]  [address]  [phone]  04/02/2012 11:23 AM

## 2012-04-04 DIAGNOSIS — I2510 Atherosclerotic heart disease of native coronary artery without angina pectoris: Secondary | ICD-10-CM | POA: Diagnosis not present

## 2012-04-04 DIAGNOSIS — E782 Mixed hyperlipidemia: Secondary | ICD-10-CM | POA: Diagnosis not present

## 2012-04-04 DIAGNOSIS — R9430 Abnormal result of cardiovascular function study, unspecified: Secondary | ICD-10-CM | POA: Diagnosis not present

## 2012-04-07 ENCOUNTER — Encounter (HOSPITAL_COMMUNITY): Payer: Self-pay | Admitting: Pharmacy Technician

## 2012-04-07 ENCOUNTER — Other Ambulatory Visit: Payer: Self-pay | Admitting: Cardiology

## 2012-04-08 ENCOUNTER — Ambulatory Visit (HOSPITAL_COMMUNITY)
Admission: RE | Admit: 2012-04-08 | Discharge: 2012-04-09 | Disposition: A | Discharge status: Home / Self Care | Payer: Medicare Other | Source: Ambulatory Visit | Attending: Cardiology | Admitting: Cardiology

## 2012-04-08 ENCOUNTER — Encounter (HOSPITAL_COMMUNITY)
Admission: RE | Disposition: A | Discharge status: Home / Self Care | Payer: Self-pay | Source: Ambulatory Visit | Attending: Cardiology

## 2012-04-08 ENCOUNTER — Encounter (HOSPITAL_COMMUNITY): Payer: Self-pay | Admitting: Cardiology

## 2012-04-08 DIAGNOSIS — R5381 Other malaise: Secondary | ICD-10-CM | POA: Diagnosis not present

## 2012-04-08 DIAGNOSIS — E118 Type 2 diabetes mellitus with unspecified complications: Secondary | ICD-10-CM | POA: Diagnosis present

## 2012-04-08 DIAGNOSIS — Z9861 Coronary angioplasty status: Secondary | ICD-10-CM | POA: Diagnosis not present

## 2012-04-08 DIAGNOSIS — E669 Obesity, unspecified: Secondary | ICD-10-CM | POA: Diagnosis not present

## 2012-04-08 DIAGNOSIS — I2584 Coronary atherosclerosis due to calcified coronary lesion: Secondary | ICD-10-CM | POA: Diagnosis not present

## 2012-04-08 DIAGNOSIS — Z951 Presence of aortocoronary bypass graft: Secondary | ICD-10-CM | POA: Diagnosis not present

## 2012-04-08 DIAGNOSIS — I2510 Atherosclerotic heart disease of native coronary artery without angina pectoris: Secondary | ICD-10-CM | POA: Diagnosis not present

## 2012-04-08 DIAGNOSIS — E119 Type 2 diabetes mellitus without complications: Secondary | ICD-10-CM | POA: Insufficient documentation

## 2012-04-08 DIAGNOSIS — E785 Hyperlipidemia, unspecified: Secondary | ICD-10-CM | POA: Diagnosis not present

## 2012-04-08 DIAGNOSIS — I25810 Atherosclerosis of coronary artery bypass graft(s) without angina pectoris: Secondary | ICD-10-CM | POA: Insufficient documentation

## 2012-04-08 DIAGNOSIS — R9430 Abnormal result of cardiovascular function study, unspecified: Secondary | ICD-10-CM | POA: Diagnosis not present

## 2012-04-08 DIAGNOSIS — I10 Essential (primary) hypertension: Secondary | ICD-10-CM | POA: Insufficient documentation

## 2012-04-08 DIAGNOSIS — Z95828 Presence of other vascular implants and grafts: Secondary | ICD-10-CM | POA: Insufficient documentation

## 2012-04-08 DIAGNOSIS — R931 Abnormal findings on diagnostic imaging of heart and coronary circulation: Secondary | ICD-10-CM | POA: Diagnosis present

## 2012-04-08 DIAGNOSIS — Z955 Presence of coronary angioplasty implant and graft: Secondary | ICD-10-CM | POA: Insufficient documentation

## 2012-04-08 HISTORY — PX: PERCUTANEOUS CORONARY STENT INTERVENTION (PCI-S): SHX5485

## 2012-04-08 LAB — GLUCOSE, CAPILLARY

## 2012-04-08 LAB — CBC
Hemoglobin: 12.8 g/dL — ABNORMAL LOW (ref 13.0–17.0)
RBC: 4.75 MIL/uL (ref 4.22–5.81)
WBC: 5.9 10*3/uL (ref 4.0–10.5)

## 2012-04-08 LAB — PROTIME-INR
INR: 1.02 (ref 0.00–1.49)
Prothrombin Time: 13.3 seconds (ref 11.6–15.2)

## 2012-04-08 LAB — BASIC METABOLIC PANEL
GFR calc Af Amer: >90 mL/min (ref >90)
GFR calc non Af Amer: 83 mL/min — ABNORMAL LOW (ref >90)
Potassium: 4 mEq/L (ref 3.5–5.1)
Sodium: 135 mEq/L (ref 135–145)

## 2012-04-08 SURGERY — PERCUTANEOUS CORONARY STENT INTERVENTION (PCI-S)
Anesthesia: LOCAL

## 2012-04-08 MED ORDER — FENTANYL CITRATE 0.05 MG/ML IJ SOLN
INTRAMUSCULAR | Status: AC
  Filled 2012-04-08: qty 2

## 2012-04-08 MED ORDER — SIMVASTATIN 20 MG PO TABS
20.0000 mg | ORAL_TABLET | Freq: Every evening | ORAL | Status: DC
  Administered 2012-04-08: 16:00:00 20 mg via ORAL
  Filled 2012-04-08 (×2): qty 1

## 2012-04-08 MED ORDER — LOSARTAN POTASSIUM 50 MG PO TABS
100.0000 mg | ORAL_TABLET | Freq: Every day | ORAL | Status: DC
  Administered 2012-04-09: 12:00:00 100 mg via ORAL
  Filled 2012-04-08 (×2): qty 2

## 2012-04-08 MED ORDER — HYDROCHLOROTHIAZIDE 12.5 MG PO CAPS
12.5000 mg | ORAL_CAPSULE | Freq: Every day | ORAL | Status: DC
Start: 2012-04-09 — End: 2012-04-09
  Filled 2012-04-08 (×2): qty 1

## 2012-04-08 MED ORDER — ACETAMINOPHEN 325 MG PO TABS: 650.0000 mg | ORAL_TABLET | ORAL | Status: DC | PRN

## 2012-04-08 MED ORDER — INSULIN ASPART 100 UNIT/ML ~~LOC~~ SOLN
0.0000 [IU] | Freq: Every day | SUBCUTANEOUS | Status: DC
  Administered 2012-04-08: 2 [IU] via SUBCUTANEOUS

## 2012-04-08 MED ORDER — BIVALIRUDIN 250 MG IV SOLR
INTRAVENOUS | Status: AC
  Filled 2012-04-08: qty 250

## 2012-04-08 MED ORDER — SODIUM CHLORIDE 0.9 % IJ SOLN: 3.0000 mL | Freq: Two times a day (BID) | INTRAMUSCULAR | Status: DC

## 2012-04-08 MED ORDER — NITROGLYCERIN IN D5W 200-5 MCG/ML-% IV SOLN
INTRAVENOUS | Status: AC
  Filled 2012-04-08: qty 250

## 2012-04-08 MED ORDER — MIDAZOLAM HCL 2 MG/2ML IJ SOLN
INTRAMUSCULAR | Status: AC
  Filled 2012-04-08: qty 2

## 2012-04-08 MED ORDER — HYDRALAZINE HCL 20 MG/ML IJ SOLN
INTRAMUSCULAR | Status: AC
  Filled 2012-04-08: qty 1

## 2012-04-08 MED ORDER — BIVALIRUDIN 250 MG IV SOLR
0.2500 mg/kg/h | INTRAVENOUS | Status: AC
  Administered 2012-04-08: 0.25 mg/kg/h via INTRAVENOUS
  Filled 2012-04-08: qty 250

## 2012-04-08 MED ORDER — TICAGRELOR 90 MG PO TABS
90.0000 mg | ORAL_TABLET | Freq: Two times a day (BID) | ORAL | Status: DC
  Administered 2012-04-08 – 2012-04-09 (×2): 90 mg via ORAL
  Filled 2012-04-08 (×4): qty 1

## 2012-04-08 MED ORDER — HEPARIN (PORCINE) IN NACL 2-0.9 UNIT/ML-% IJ SOLN
INTRAMUSCULAR | Status: AC
  Filled 2012-04-08: qty 1000

## 2012-04-08 MED ORDER — CARVEDILOL 12.5 MG PO TABS
12.5000 mg | ORAL_TABLET | Freq: Two times a day (BID) | ORAL | Status: DC
  Administered 2012-04-08: 16:00:00 12.5 mg via ORAL
  Filled 2012-04-08 (×3): qty 1

## 2012-04-08 MED ORDER — MORPHINE SULFATE 2 MG/ML IJ SOLN: 1.0000 mg | INTRAMUSCULAR | Status: DC | PRN

## 2012-04-08 MED ORDER — SODIUM CHLORIDE 0.9 % IV SOLN: 250.0000 mL | INTRAVENOUS | Status: DC | PRN

## 2012-04-08 MED ORDER — SODIUM CHLORIDE 0.9 % IJ SOLN: 3.0000 mL | INTRAMUSCULAR | Status: DC | PRN

## 2012-04-08 MED ORDER — SODIUM CHLORIDE 0.9 % IV SOLN
1.0000 mL/kg/h | INTRAVENOUS | Status: AC
  Administered 2012-04-08: 1 mL/kg/h via INTRAVENOUS

## 2012-04-08 MED ORDER — LIDOCAINE HCL (PF) 1 % IJ SOLN
INTRAMUSCULAR | Status: AC
  Filled 2012-04-08: qty 30

## 2012-04-08 MED ORDER — LABETALOL HCL 5 MG/ML IV SOLN
INTRAVENOUS | Status: AC
  Filled 2012-04-08: qty 4

## 2012-04-08 MED ORDER — SODIUM CHLORIDE 0.9 % IV SOLN
INTRAVENOUS | Status: DC
  Administered 2012-04-08: 09:00:00 via INTRAVENOUS

## 2012-04-08 MED ORDER — ASPIRIN EC 325 MG PO TBEC
325.0000 mg | DELAYED_RELEASE_TABLET | Freq: Every day | ORAL | Status: DC
  Administered 2012-04-09: 10:00:00 325 mg via ORAL
  Filled 2012-04-08 (×2): qty 1

## 2012-04-08 MED ORDER — TICAGRELOR 90 MG PO TABS
ORAL_TABLET | ORAL | Status: AC
  Filled 2012-04-08: qty 2

## 2012-04-08 MED ORDER — INSULIN ASPART 100 UNIT/ML ~~LOC~~ SOLN
0.0000 [IU] | Freq: Three times a day (TID) | SUBCUTANEOUS | Status: DC
  Administered 2012-04-09: 10:00:00 2 [IU] via SUBCUTANEOUS

## 2012-04-08 MED ORDER — ONDANSETRON HCL 4 MG/2ML IJ SOLN
4.0000 mg | Freq: Four times a day (QID) | INTRAMUSCULAR | Status: DC | PRN
  Filled 2012-04-08: qty 2

## 2012-04-08 MED ORDER — HYDRALAZINE HCL 20 MG/ML IJ SOLN
20.0000 mg | Freq: Four times a day (QID) | INTRAMUSCULAR | Status: DC | PRN
  Administered 2012-04-08: 16:00:00 20 mg via INTRAVENOUS
  Filled 2012-04-08: qty 1

## 2012-04-08 NOTE — Progress Notes (Signed)
Benefits check for Brilinta 90 mg PO BID

## 2012-04-08 NOTE — CV Procedure (Addendum)
[facility] PERCUTANEOUS CORONARY INTERVENTION REPORT  NAMEHabib Kise   MRN: [id number] DOB:  August 30, 1940   ADMIT DATE: 04/08/2012 Procedure Date: 04/08/2012   INTERVENTIONAL CARDIOLOGIST: Marykay Lex, M.D., MS PRIMARY CARE PROVIDER: Toma Deiters, MD PRIMARY CARDIOLOGIST: Marykay Lex, M.D., MS   PATIENT:  [name] is a 72 y.o. male who recently underwent diagnostic a catheterization on 04/02/2012 evaluation a high-risk cardiac stress test performed in our office. The patient is well-known to me, having initially seen him for preoperative risk assessment for knee surgery. This led to a Myoview that was abnormal and certainly a cardiac catheterization showed multivessel disease. He then underwent CABG x5: LIMA-LAD, SVG-RCA, SVG-Ramus Intermedius, SVG-OM1-OM 2. He has had no anginal symptoms both pre-and post CABG. However a screening Myoview due to fatigue demonstrated significant anterior lateral ischemia with significant ST depressions with exercise.  Cardiac catheterization on 04/02/2012 demonstrated 2 separate lesions described below. After reviewing the films with my partner Dr. Tresa Endo, he is now referred back for staged PCI E. both lesions today.   PRE-OPERATIVE DIAGNOSIS:    High-Risk Treadmill Cardiolite Stress Test  Severe Left Circumflex-OM1 lesion  Severe and anastomotic lesion in the SVG-Ramus Intermedius (RI)  PROCEDURES PERFORMED:    Successful, complex/difficult PTCA/PCI of the Circumflex-OM1 lesion using Cutting Balloon Angioplasty followed by placement of a Promus Premier DES 2.25 mm x 12 mm; postdilated distally to 2.3 mm and proximally 2.6 mm  Successful direct stenting of the anastomotic SVG-RI with a Promus Premier DES 2.5 mm x 16 mm; postdilated in the graft to 2.8 mm, 2.6 in the native vessel.  PROCEDURE:Consent:  Risks of procedure as well as the alternatives and risks of each were explained to the (patient/caregiver).   Consent for procedure obtained. Consent for signed by MD and patient with RN witness -- placed on chart.   PROCEDURE: The patient was brought to the 2nd Floor [facility] Lab in the fasting state and prepped and draped in the usual sterile fashion for Right groin access. Sterile technique was used including antiseptics, cap, gloves, gown, hand hygiene, mask and sheet.  Skin prep: Chlorhexidine.  Time Out: Verified patient identification, verified procedure, site/side was marked, verified correct patient position, special equipment/implants available, medications/allergies/relevent history reviewed, required imaging and test results available.  Performed  Access: Right Common Femoral Artery; 7 Fr Sheath - fluoroscopically guided modified Seldinger technique Diagnostic:  Catheters advanced and exchanged over standard J-wire  Left Coronary Artery Angiography: 7 Fr XB 3.5 Guide -PCI as described below-   LV Hemodynamics: XB 3.5 crossed the Aortic valve for hemodynamic maeasurement.  SVG-Ramus Intermedius: 7 Fr AL1 --  PCI as described below  The catheter was removed out of body over wire the sheath was sutured in place. He will be removed in the recovery area 2 hours after the Angiomax drip was discontinued.    ANESTHESIA:   Local Lidocaine 18 ml SEDATION:  3 mg IV Versed, 75 mcg IV fentanyl ; Premedication: 5 mg by mouth Valium MEDICATIONS: Omnipaque contrast: 360 mL  Anticoagulation: Angiomax bolus and drip -- Discontinued at the end of the procedure   Anti-Platelet Agent: Ticagrelor 180 mg  Antihypertensive Medications: Hydralazine 20 mg IV; Labetalol 10 mg IV x2;   Intracoronary Nitroglycerin Injections 200 mcg x2   Hemodynamics:  Central Aortic / Mean Pressures: 161/77 mmHg; 110 mmHg  Left Ventricular Pressures / EDP: 166/7 mmHg; 8 mmHg   Coronary Anatomy:  Left Main: Large-caliber vessel with  mild calcification; trifurcates distally into the LAD,  Circumflex, and Ramus Intermedius. LAD: Angiographically moderate caliber vessel with a focal 80% proximal stenosis followed by a sequential 78% stenosis before the vessel is essentially occluded in the mid vessel after giving rise to a septal perforator.  The downstream LAD beyond the LIMA is subtotally occluded with a long tubular 90% stenosis with diffuse distal disease beyond this lesion. The LAD itself actually wraps around the apex beyond lesion  Left Circumflex: Large-caliber vessel with a mid 99%, likely calcified focal stenosis just at the takeoff of OM1 that is a moderate caliber vessel (Lesion #1). Beyond this the vessel courses down as a AV groove circumflex. The previously visualized OM 2 is not visualized.  OM1: Is a major lateral branch that courses along the inferolateral wall.  Ramus intermedius: Moderate caliber vessel, initially described as an obtuse marginal. There is proximal stenosis before the vessel bifurcates into a smaller caliber anterolateral branch (diagonal) and OM branch that is subtotally occluded.   This vessel fills downstream via a patent vein graft.   SVG-Ramus Intermedius (RI): Widely patent until the distal anastomotic segment where it tapers to a roughly 80-90% stenosis (Lesion #2). There is however brisk flow distally.   Percutaneous Coronary Intervention:   Angiomax bolus was administered and drip was begun after sheath placement. An ACT of greater than 200 sec was assured prior to wire advancement.  The patient in premedicated on the table with 180 mg Ticagrelor.  Lesion #1: Mid circumflex-OM1/AV groove circumflex bifurcation lesion Guide: 7 Fr   XB 3.5 Guidewire: PT Graphix 2 MS  Initial attempts to pass a 2.0 mm 15 mm AngioSculpt balloon were unsuccessful, therefore a small compliant predilation balloon was chosen.    Predilation Balloon #1 :  [place] Monorail 1.5 mm x  12 mm;  8 Atm x  60 Sec,  12 Atm x  35 Sec  Passing the balloon was quite  difficult, required extensive maneuvering techniques.   Predilation Balloon #2:  Flexed home Cutting Balloon 2.0 mm x  6 mm;   4 Atm x  35 Sec (proximal); 8 Atm x  65 Sec (distal); 8 Atm x  45 Sec (mid at bifurcation)  After initial rounds of inflation an attempt was made to pass the stent was unsuccessful, therefore 2 more inflations were made with the balloon distal and proximal.  8 Atm x  55 Sec,  8 Atm x  45 Sec    Stent: Promus Premier 2.25 mm x 12 mm;  12  Atm x  45  Sec  IC Nitroglycerin -- was administered to relieve likely spasm at the proximal end of stent   Post-dilation Balloon: [place] Quantum Apex 2.5 mm x  8  mm;    IC nitroglycerin   12  Atm x  50  Sec (proximal stent), Final Diameter 2.6 mm  14 Atm x 50 (mid stent at bifurcation), Final Diameter 2.8 mm  Post-deployment cineangiography with and without guidewire in place was performed in multiple orthogonal views demonstrating excellent stent deployment and did reveal evidence of dissection or perforation. The previously noted spasms on her present  This guide catheters and exchanged for the guide catheter for Lesion #2.  Lesion #2: Anastomotic SVG-Ramus Intermedius  Guide: 6 Fr    AL-1  Guidewire: Luge   Stent: Promus Premier   2.5  mm x 16 mm; roughly 6 mm within the native vessel and 10 mm in the distal graft.  12  Atm x  30 Sec - diameter in the native vessel 2.5 mm   Post-dilation Balloon:  [place] Quantum Apex 2.75  mm x 8 mm; in the stented portion of the distal graft  14  Atm x  45 Sec, Final Diameter -- 2.8 mm   PATIENT DISPOSITION:    The patient was transferred to the PACU holding area in a hemodynamicaly stable, chest pain free condition.  The patient tolerated the procedure well, and there were no complications.  The patient was stable before, during, and after the procedure.  EBL:   < 20 ml  POST-OPERATIVE DIAGNOSIS:    Successful 2 separate site PCI with a Promus Premier DES stents as described  above.   Significant Systemic Hypertension, but with normal LVEDP  PLAN OF CARE:  Standard post PCI care and sheath removal.   Overnight observation anticipate discharge in the morning.   He will need when necessary medications for blood pressure control in order to safely remove the sheath. I've written for when necessary IV hydralazine. He'll also receive his home medications.  Titrate medications prior to discharge in the morning if stable.  Dual Antiplatelet Therapy x at least 1 year  He'll followup with me as scheduled.  I discussed the case with the patient and family and reviewed pictures with the patient.  Marykay Lex, M.D., M.S. [facility] [address] [place], [place]  [address]  [phone]  04/08/2012 8:19 PM

## 2012-04-08 NOTE — H&P (Signed)
History and Physical Interval Note:  NAMENorberto George   MRN: [id number] DOB:  [birth year]   ADMIT DATE: 04/08/2012   04/08/2012 8:03 AM  [name] is a 72 y.o. male , retired from [employer] after 79yrs, we saw in February 2012 as a pre op clearance prior to TKR. A Myoview was done and waas abnormal leading to cardiac cath and subsequent CABG X 5 with LIMA-LAD, SVG-RCA, SVG-RI, SVG-OM1 and OM2. Echo done Aug 2012 showed his EF to be >55%. He had no anginal symptoms pre CABG. [name] saw him this past summer and he was doing well. Myoview was done today and this again show significant ant/lat ischemia with significant ST depression with exercise He underwent diagnostic cardiac catheterization on 2/19 that demonstrated an occluded SVG-RCA & SVG-OM1-2 as well as a severe anastomotic SVG-RI lesion.  He now returns for staged PCI of the native LCx-OM & SVG-RI (potentially 1 day vs. Staged). He was seen in clinic 04/04/12 to discuss the results & plan for PCI.   Past Medical History  Diagnosis Date  . Diabetes type 2, controlled   . HTN (hypertension)   . CAD (coronary artery disease) 05/2010    CABG X 5; cath 03/2012 --> LIMA-LAD - patent, SVG-Ramus - anastamotic 80%; SVG-RCA & SVG-OM1-OM2-- occluded    . Dyslipidemia   . DJD (degenerative joint disease) 9/12    Rt TKR   Past Surgical History  Procedure Laterality Date  . Coronary artery bypass graft  4/12    x 5 (LIMA-LAD, SVG- RI,. SVG-OM1-OM2, SVG-RCA)  . Total knee arthroplasty  9/12    Rt  . Umbilical hernia repair      FAMHx: No family history on file.  SOCHx:  reports that he has never smoked. He does not have any smokeless tobacco history on file. He reports that he does not drink alcohol or use illicit drugs.  ALLERGIES: No Known Allergies  HOME MEDICATIONS: Prescriptions prior to admission  Medication Sig Dispense Refill  . aspirin EC 325 MG tablet Take 325 mg by mouth daily.      . carvedilol (COREG) 12.5  MG tablet Take 12.5 mg by mouth 2 (two) times daily with a meal.      . hydrochlorothiazide (MICROZIDE) 12.5 MG capsule Take 12.5 mg by mouth daily.      . isosorbide mononitrate (IMDUR) 60 MG 24 hr tablet Take 30 mg by mouth daily.      Marland Kitchen losartan (COZAAR) 100 MG tablet Take 100 mg by mouth daily.      . metFORMIN (GLUCOPHAGE) 500 MG tablet Take 500 mg by mouth 2 (two) times daily with a meal.      . simvastatin (ZOCOR) 20 MG tablet Take 20 mg by mouth every evening.        IMPRESSION & PLAN The patients' history has been reviewed, patient examined, no change in status from most recent note, stable for surgery. I have reviewed the patients' chart and labs. Questions were answered to the patient's satisfaction.    [name] has presented today for surgery, with the diagnosis of High Risk Cardiolite ST with Known Severe CAD: The various methods of treatment have been discussed with the patient and family.   Risks / Complications include, but not limited to: Death, MI, CVA/TIA, VF/VT (with defibrillation), Bradycardia (need for temporary pacer placement), contrast induced nephropathy, bleeding / bruising / hematoma / pseudoaneurysm, vascular or coronary injury (with possible emergent CT or Vascular Surgery), adverse  medication reactions, infection.     After consideration of risks, benefits and other options for treatment, the patient has consented to Procedure(s):  LEFT HEART CATHETERIZATION AND CORONARY ANGIOGRAPHY +/- PERCUTANEOUS CORONARY INTERVENTION as a surgical intervention.   We will proceed with the planned procedure.   HARDING,DAVID W [facility] [address] [place], [place]  [address]  [phone]  04/08/2012 8:03 AM

## 2012-04-08 NOTE — Progress Notes (Signed)
Utilization Review Completed Ignazio Kincaid J. Sharlon Pfohl, RN, BSN, NCM [phone]  

## 2012-04-08 NOTE — Brief Op Note (Signed)
04/08/2012  1:43 PM  PATIENT:  [name]  72 y.o. male referred for staged PCI of Native Cx-OM1 & SVG-RI.  PRE-OPERATIVE DIAGNOSIS:  Cad; High Risk Cardiolite ST  90% mid Circumflex-OM1 calcified stenosis  80-90% Anastomotic lesion of SVG-RI  POST-OPERATIVE DIAGNOSIS:    Successful 2 Site PCI as described   Systemic HTN   PROCEDURE:    Complex, difficult PTCA-STENTING (PCI) of Mid Circumflex-OM1 using Cutting Balloon -- Promus Premier DES 2.25 mm x 12 mm - post-dilated proximally to 2.67mm  PCI of Anastomotic lesion in SVG-RI with Promus Premier DES 2.5 mm x 16 mm; post-dilated to 2.8 mm in graft. Stent covers distal graft into proximal grafted native RI  LHC - for LV Hemodynamics  SURGEON:  Surgeon(s) and Role:    * Marykay Lex, MD - Primary  ANESTHESIA:   local and IV sedation; 3mg  Versed, 75 mcg Fentanyl (Premed 5mg  PO Valium  EBL:   < 20 ml  EQUIPMENT: 7 Fr RFA Sheath; Guide #1: Cx-OM XB 3.5 - Guidewire: PT Graphics MS; Guide #2 - SVG-RI: AL1, Guidewire - Luge  LOCAL MEDICATIONS USED:  LIDOCAINE   SPECIMEN:  Source of Specimen:  Blood from sheath for ACT  7 FR RFA Sheath Sutured in place.   DICTATION: .Note written in paper chart and Note written in EPIC  PLAN OF CARE: Admit for overnight observation DAPT - ASA, Brilinta  PATIENT DISPOSITION:  PACU - hemodynamically stable.   Delay start of Pharmacological VTE agent (>24hrs) due to surgical blood loss or risk of bleeding: not applicable  HARDING,DAVID W, M.D., M.S. [facility] [address] [place], [place]  [address]  [phone] Pager # [phone] 04/08/2012 1:53 PM

## 2012-04-08 NOTE — Progress Notes (Signed)
Site area: right groin  Site Prior to Removal:  Level 0  Pressure Applied For 20 MINUTES    Minutes Beginning at 1830  Manual:   yes  Patient Status During Pull:  stable  Post Pull Groin Site:  Level 0  Post Pull Instructions Given:  yes  Post Pull Pulses Present:  yes  Dressing Applied:  yes  Comments:  Gauze pressure dressing applied

## 2012-04-09 DIAGNOSIS — Z9861 Coronary angioplasty status: Secondary | ICD-10-CM

## 2012-04-09 LAB — CBC
HCT: 37.5 % — ABNORMAL LOW (ref 39.0–52.0)
Hemoglobin: 13 g/dL (ref 13.0–17.0)
MCV: 77.6 fL — ABNORMAL LOW (ref 78.0–100.0)
RDW: 13.9 % (ref 11.5–15.5)
WBC: 6.7 10*3/uL (ref 4.0–10.5)

## 2012-04-09 LAB — GLUCOSE, CAPILLARY
Glucose-Capillary: 89 mg/dL (ref 70–99)
Glucose-Capillary: 135 mg/dL — ABNORMAL HIGH (ref 70–99)

## 2012-04-09 LAB — BASIC METABOLIC PANEL
BUN: 17 mg/dL (ref 6–23)
CO2: 25 mEq/L (ref 19–32)
Chloride: 101 mEq/L (ref 96–112)
Creatinine, Ser: 1.1 mg/dL (ref 0.50–1.35)
GFR calc Af Amer: 77 mL/min — ABNORMAL LOW (ref >90)
Glucose, Bld: 137 mg/dL — ABNORMAL HIGH (ref 70–99)
Potassium: 4 mEq/L (ref 3.5–5.1)

## 2012-04-09 MED ORDER — HYDROCHLOROTHIAZIDE 25 MG PO TABS: 25.0000 mg | ORAL_TABLET | Freq: Every day | ORAL | Status: DC

## 2012-04-09 MED ORDER — NITROGLYCERIN 0.4 MG SL SUBL: 0.4000 mg | SUBLINGUAL_TABLET | SUBLINGUAL | Status: DC | PRN

## 2012-04-09 MED ORDER — ASPIRIN 81 MG PO TABS: 81.0000 mg | ORAL_TABLET | Freq: Every day | ORAL | Status: DC

## 2012-04-09 MED ORDER — CARVEDILOL 6.25 MG PO TABS
18.7500 mg | ORAL_TABLET | Freq: Two times a day (BID) | ORAL | Status: DC
  Filled 2012-04-09 (×2): qty 1

## 2012-04-09 MED ORDER — HYDROCHLOROTHIAZIDE 25 MG PO TABS
25.0000 mg | ORAL_TABLET | Freq: Every day | ORAL | Status: DC
  Filled 2012-04-09: qty 1

## 2012-04-09 MED ORDER — CARVEDILOL 6.25 MG PO TABS: 18.7500 mg | ORAL_TABLET | Freq: Two times a day (BID) | ORAL | Status: DC

## 2012-04-09 MED ORDER — TICAGRELOR 90 MG PO TABS: 90.0000 mg | ORAL_TABLET | Freq: Two times a day (BID) | ORAL | Status: DC

## 2012-04-09 MED ORDER — METFORMIN HCL 500 MG PO TABS: 500.0000 mg | ORAL_TABLET | Freq: Two times a day (BID) | ORAL | Status: DC

## 2012-04-09 MED ORDER — ACETAMINOPHEN 325 MG PO TABS: 650.0000 mg | ORAL_TABLET | ORAL | Status: DC | PRN

## 2012-04-09 MED ORDER — NITROGLYCERIN 0.4 MG SL SUBL: 0.4000 mg | SUBLINGUAL_TABLET | SUBLINGUAL | Status: AC | PRN

## 2012-04-09 MED FILL — Dextrose Inj 5%: INTRAVENOUS | Qty: 50 | Status: AC

## 2012-04-09 NOTE — Care Management Note (Signed)
    Page 1 of 1   04/09/2012     8:42:47 AM   CARE MANAGEMENT NOTE 04/09/2012  Patient:  DUJUAN, STANKOWSKI   Account Number:  [id number]  Date Initiated:  04/09/2012  Documentation initiated by:  [facility]  Subjective/Objective Assessment:   staged PCI of the native LCx-OM & SVG-RI (potentially 1 day vs. Staged).     Action/Plan:   Home with wife/ Benefits check   Anticipated DC Date:  04/09/2012   Anticipated DC Plan:  HOME/SELF CARE      DC Planning Services  CM consult      Choice offered to / List presented to:             Status of service:   Medicare Important Message given?   (If response is "NO", the following Medicare IM given date fields will be blank) Date Medicare IM given:   Date Additional Medicare IM given:    Discharge Disposition:    Per UR Regulation:    If discussed at Long Length of Stay Meetings, dates discussed:    Comments:  04/09/12 @ 0830 Oletta Cohn, RN, BSN, [place] [phone] Benefits check for: Ticagrelor (BRILINTA) tablet 90 mg BID COPAY IS $ 87.34 PER [facility] [phone]. Brilinta packet containing 30day free card and refill card given to patient in cath lab.

## 2012-04-09 NOTE — Discharge Summary (Signed)
Patient ID: [name],  MRN: [id number], DOB/AGE: 70-Nov-1942 70 y.o.  Admit date: 04/08/2012 Discharge date: 04/09/2012  Primary Care Provider: Dr Olena Leatherwood Primary Cardiologist: Dr Herbie Baltimore  Discharge Diagnoses Principal Problem:   Abnormal nuclear cardiac imaging , 03/25/12 Active Problems:   CAD, CABG X 18 May 2010, progression at cath 04/02/12   S/P elective  PTCA / DES to SVG-RI and OM1 04/08/12   HTN (hypertension)   Dyslipidemia   Type 2 DM   Obesity, unspecified    Procedures: Elective SVG-Ri DES, OM1 DES 04/08/12   Hospital Course:   72 y/o male, retired from [employer] after 45yrs, we saw in February 2012 as a pre op clearance prior to TKR. A Myoview was done and waas abnormal leading to cardiac cath and subsequent CABG X 5 with LIMA-LAD, SVG-RCA, SVG-RI, SVG-OM1 and OM2. Echo done Aug 2012 showed his EF to be >55%. He had no anginal symptoms pre CABG. Dr Herbie Baltimore saw him this past summer and he was doing well. Myoview was done today and this again show significant ant/lat ischemia with significant ST depression with exercise. He says he has not had any unusual chest pain or SOB. Dr Tresa Endo reviewed the scan and felt it was high risk and he was set up for an OP cath with Dr Herbie Baltimore which was done  04/02/12 and this revealed.  Severe native CAD including severe proximal LAD followed by 100% stenosis, 100% RCA, severe sequential lesions in the Circumflex-OM system as well as the RI.  He had of 4 grafts occluded with significant anastomotic stenosis of the SVG-RI leaving only the widely patent LIMA-LAD graft that perfuses a diffusely diseased distal vessel. He had mild to moderate LVD. He was discharged and re-admitted 04/08/12 for an elective PCI as noted above. We feel he can be discharge 04/09/12. We plan to increase Coreg as an OP to 25mg  BID. His HCTZ was increased at discharge.     Discharge Vitals:  Blood pressure 126/60, pulse 65, temperature 97.9 F (36.6 C), temperature  source Oral, resp. rate 16, height 6\' 2"  (1.88 m), weight 105.2 kg (231 lb 14.8 oz), SpO2 98.00%.    Labs: Results for orders placed during the hospital encounter of 04/08/12 (from the past 48 hour(s))  GLUCOSE, CAPILLARY     Status: Abnormal   Collection Time    04/08/12  8:11 AM      Result Value Range   Glucose-Capillary 178 (*) 70 - 99 mg/dL   Comment 1 Documented in Chart     Comment 2 Notify RN    BASIC METABOLIC PANEL     Status: Abnormal   Collection Time    04/08/12  8:28 AM      Result Value Range   Sodium 135  135 - 145 mEq/L   Potassium 4.0  3.5 - 5.1 mEq/L   Chloride 100  96 - 112 mEq/L   CO2 25  19 - 32 mEq/L   Glucose, Bld 176 (*) 70 - 99 mg/dL   BUN 18  6 - 23 mg/dL   Creatinine, Ser 9.56  0.50 - 1.35 mg/dL   Calcium 9.8  8.4 - 21.3 mg/dL   GFR calc non Af Amer 83 (*) >90 mL/min   GFR calc Af Amer >90  >90 mL/min   Comment:            The eGFR has been calculated     using the CKD EPI equation.     This calculation  has not been     validated in all clinical     situations.     eGFR's persistently     <90 mL/min signify     possible Chronic Kidney Disease.  CBC     Status: Abnormal   Collection Time    04/08/12  8:28 AM      Result Value Range   WBC 5.9  4.0 - 10.5 K/uL   RBC 4.75  4.22 - 5.81 MIL/uL   Hemoglobin 12.8 (*) 13.0 - 17.0 g/dL   HCT 16.1 (*) 09.6 - 04.5 %   MCV 77.9 (*) 78.0 - 100.0 fL   MCH 26.9  26.0 - 34.0 pg   MCHC 34.6  30.0 - 36.0 g/dL   RDW 40.9  81.1 - 91.4 %   Platelets 148 (*) 150 - 400 K/uL  PROTIME-INR     Status: None   Collection Time    04/08/12  8:28 AM      Result Value Range   Prothrombin Time 13.3  11.6 - 15.2 seconds   INR 1.02  0.00 - 1.49  POCT ACTIVATED CLOTTING TIME     Status: None   Collection Time    04/08/12 12:01 PM      Result Value Range   Activated Clotting Time 448    GLUCOSE, CAPILLARY     Status: None   Collection Time    04/08/12  4:39 PM      Result Value Range   Glucose-Capillary 98  70 -  99 mg/dL   Comment 1 Notify RN    GLUCOSE, CAPILLARY     Status: Abnormal   Collection Time    04/08/12  9:57 PM      Result Value Range   Glucose-Capillary 243 (*) 70 - 99 mg/dL   Comment 1 Notify RN    CBC     Status: Abnormal   Collection Time    04/09/12  6:38 AM      Result Value Range   WBC 6.7  4.0 - 10.5 K/uL   RBC 4.83  4.22 - 5.81 MIL/uL   Hemoglobin 13.0  13.0 - 17.0 g/dL   HCT 78.2 (*) 95.6 - 21.3 %   MCV 77.6 (*) 78.0 - 100.0 fL   MCH 26.9  26.0 - 34.0 pg   MCHC 34.7  30.0 - 36.0 g/dL   RDW 08.6  57.8 - 46.9 %   Platelets 178  150 - 400 K/uL  BASIC METABOLIC PANEL     Status: Abnormal   Collection Time    04/09/12  6:38 AM      Result Value Range   Sodium 135  135 - 145 mEq/L   Potassium 4.0  3.5 - 5.1 mEq/L   Chloride 101  96 - 112 mEq/L   CO2 25  19 - 32 mEq/L   Glucose, Bld 137 (*) 70 - 99 mg/dL   BUN 17  6 - 23 mg/dL   Creatinine, Ser 6.29  0.50 - 1.35 mg/dL   Calcium 9.8  8.4 - 52.8 mg/dL   GFR calc non Af Amer 67 (*) >90 mL/min   GFR calc Af Amer 77 (*) >90 mL/min   Comment:            The eGFR has been calculated     using the CKD EPI equation.     This calculation has not been     validated in all clinical     situations.  eGFR's persistently     <90 mL/min signify     possible Chronic Kidney Disease.  GLUCOSE, CAPILLARY     Status: Abnormal   Collection Time    04/09/12  7:57 AM      Result Value Range   Glucose-Capillary 135 (*) 70 - 99 mg/dL    Disposition:      Follow-up Information   Follow up with Abelino Derrick, PA On 04/22/2012. (11:10am)    Contact information:   [address] [place] [address] [phone]       Discharge Medications:    Medication List    STOP taking these medications       aspirin EC 325 MG tablet     hydrochlorothiazide 12.5 MG capsule  Commonly known as:  MICROZIDE      TAKE these medications       acetaminophen 325 MG tablet  Commonly known as:  TYLENOL  Take 2  tablets (650 mg total) by mouth every 4 (four) hours as needed.     aspirin 81 MG tablet  Take 1 tablet (81 mg total) by mouth daily.     carvedilol 6.25 MG tablet  Commonly known as:  COREG  Take 3 tablets (18.75 mg total) by mouth 2 (two) times daily with a meal.     hydrochlorothiazide 25 MG tablet  Commonly known as:  HYDRODIURIL  Take 1 tablet (25 mg total) by mouth daily.     isosorbide mononitrate 60 MG 24 hr tablet  Commonly known as:  IMDUR  Take 30 mg by mouth daily.     losartan 100 MG tablet  Commonly known as:  COZAAR  Take 100 mg by mouth daily.     metFORMIN 500 MG tablet  Commonly known as:  GLUCOPHAGE  Take 1 tablet (500 mg total) by mouth 2 (two) times daily with a meal.  Start taking on:  04/11/2012     nitroGLYCERIN 0.4 MG SL tablet  Commonly known as:  NITROSTAT  Place 1 tablet (0.4 mg total) under the tongue every 5 (five) minutes as needed for chest pain.     simvastatin 20 MG tablet  Commonly known as:  ZOCOR  Take 20 mg by mouth every evening.     Ticagrelor 90 MG Tabs tablet  Commonly known as:  BRILINTA  Take 1 tablet (90 mg total) by mouth 2 (two) times daily.        Duration of Discharge Encounter: Greater than 30 minutes including physician time.  Jolene Provost PA-C 04/09/2012 9:36 AM

## 2012-04-09 NOTE — Progress Notes (Signed)
CARDIAC REHAB PHASE I   PRE:  Rate/Rhythm: 75 SR    BP: sitting 95621    SaO2: 100 RA  MODE:  Ambulation: 550 ft   POST:  Rate/Rhythm: 101 ST    BP: sitting 30865     SaO2:   Tolerated well. No c/o. Ed completed/reviewed. Voiced understanding and recognizes that he needs to tighten up on diet and ex. Requests his name be sent to [facility]. [phone]  Harriet Masson CES, ACSM

## 2012-04-09 NOTE — Progress Notes (Addendum)
Subjective:  Doing well - uneventful sheath pull last PM  Objective:  Vital Signs in the last 24 hours: Temp:  [97.4 F (36.3 C)-98.6 F (37 C)] 97.9 F (36.6 C) (02/26 0752) Pulse Rate:  [64-86] 65 (02/26 0000) Resp:  [16] 16 (02/25 1455) BP: (97-187)/(40-76) 126/60 mmHg (02/26 0443) SpO2:  [98 %-100 %] 98 % (02/26 0752) Weight:  [105.2 kg (231 lb 14.8 oz)] 105.2 kg (231 lb 14.8 oz) (02/26 0443)  Intake/Output from previous day: 02/25 0701 - 02/26 0700 In: 1208.3 [P.O.:360; I.V.:848.3] Out: 400 [Urine:400] Intake/Output from this shift:    Physical Exam: General appearance: alert, cooperative, appears stated age, no distress and mildly obese Neck: no carotid bruit, no JVD and supple, symmetrical, trachea midline Lungs: clear to auscultation bilaterally, normal percussion bilaterally and non-labored Heart: regular rate and rhythm, S1, S2 normal, no murmur, click, rub or gallop Abdomen: soft, non-tender; bowel sounds normal; no masses,  no organomegaly Pulses: 2+ and symmetric Skin: Skin color, texture, turgor normal. No rashes or lesions Arteriotomy site - c/d/i; mild appropriate tenderness.  Lab Results:  Recent Labs  04/08/12 0828 04/09/12 0638  WBC 5.9 6.7  HGB 12.8* 13.0  PLT 148* 178    Recent Labs  04/08/12 0828 04/09/12 0638  NA 135 135  K 4.0 4.0  CL 100 101  CO2 25 25  GLUCOSE 176* 137*  BUN 18 17  CREATININE 0.94 1.10    Cardiac Studies: PCI per note  Assessment/Plan:  Principal Problem:   Abnormal nuclear cardiac imaging , 03/25/12 Active Problems:   CAD (coronary artery disease), CABG X 5 4/12; 3 of 5 grafts 100% occluded as of 2.2014    Presence of drug coated stent in left circumflex coronary artery; Promus Premier 2.25 mm x 12 mm    Presence of Saphenous Vein Bypass Graft stent -- anastomotic SVG-RI lesion, Promus Premier DES 2.5 mm x 16 mm (postdilated to 0.8 in graft)   HTN (hypertension)   Dyslipidemia   Type 2 DM   Obesity,  unspecified  Stable post PCI x 2 as described.   BP improved after receiving home BP meds & no issues with sheath removal.  Working with [facility] today -- plans to do  OP CR[facility] since he has done well previously. DAPT x minimum of one year. Had increased home HCTZ dose to 25mg  to combine with max dose Losartan & 18.75mg  BID Coreg --> would simply continue with this regimen until he is seen in ROV with me as scheduled.  Plan was to increase to 25mg  bid as OP.  BP high today b/c not on full dose BB or HCTZ.  Statin; PRN SL NTG  Restart Metformin 48 hr post PCI  D/c today. If no appt with me in next 1-2 weeks, can see Mr. Diona Fanti, [place], who saw him prior to his Dx cath.    LOS: 1 day    Bee Marchiano W 04/09/2012, 8:25 AM

## 2012-04-22 DIAGNOSIS — E119 Type 2 diabetes mellitus without complications: Secondary | ICD-10-CM | POA: Diagnosis not present

## 2012-04-22 DIAGNOSIS — I25810 Atherosclerosis of coronary artery bypass graft(s) without angina pectoris: Secondary | ICD-10-CM | POA: Diagnosis not present

## 2012-04-22 DIAGNOSIS — I2510 Atherosclerotic heart disease of native coronary artery without angina pectoris: Secondary | ICD-10-CM | POA: Diagnosis not present

## 2012-05-02 DIAGNOSIS — I25810 Atherosclerosis of coronary artery bypass graft(s) without angina pectoris: Secondary | ICD-10-CM | POA: Insufficient documentation

## 2012-05-05 DIAGNOSIS — I25810 Atherosclerosis of coronary artery bypass graft(s) without angina pectoris: Secondary | ICD-10-CM | POA: Diagnosis not present

## 2012-05-05 DIAGNOSIS — E782 Mixed hyperlipidemia: Secondary | ICD-10-CM | POA: Diagnosis not present

## 2012-06-03 DIAGNOSIS — I10 Essential (primary) hypertension: Secondary | ICD-10-CM | POA: Diagnosis not present

## 2012-06-03 DIAGNOSIS — I25810 Atherosclerosis of coronary artery bypass graft(s) without angina pectoris: Secondary | ICD-10-CM | POA: Diagnosis not present

## 2012-06-03 DIAGNOSIS — I2510 Atherosclerotic heart disease of native coronary artery without angina pectoris: Secondary | ICD-10-CM | POA: Diagnosis not present

## 2012-06-03 DIAGNOSIS — E782 Mixed hyperlipidemia: Secondary | ICD-10-CM | POA: Diagnosis not present

## 2012-06-09 DIAGNOSIS — I10 Essential (primary) hypertension: Secondary | ICD-10-CM | POA: Diagnosis not present

## 2012-06-10 DIAGNOSIS — Z9861 Coronary angioplasty status: Secondary | ICD-10-CM | POA: Diagnosis not present

## 2012-06-10 DIAGNOSIS — Z5189 Encounter for other specified aftercare: Secondary | ICD-10-CM | POA: Diagnosis not present

## 2012-06-13 DIAGNOSIS — Z9861 Coronary angioplasty status: Secondary | ICD-10-CM | POA: Diagnosis not present

## 2012-06-16 DIAGNOSIS — Z9861 Coronary angioplasty status: Secondary | ICD-10-CM | POA: Diagnosis not present

## 2012-06-18 DIAGNOSIS — E119 Type 2 diabetes mellitus without complications: Secondary | ICD-10-CM | POA: Diagnosis not present

## 2012-06-18 DIAGNOSIS — Z9861 Coronary angioplasty status: Secondary | ICD-10-CM | POA: Diagnosis not present

## 2012-06-18 DIAGNOSIS — H04129 Dry eye syndrome of unspecified lacrimal gland: Secondary | ICD-10-CM | POA: Diagnosis not present

## 2012-06-18 DIAGNOSIS — H2510 Age-related nuclear cataract, unspecified eye: Secondary | ICD-10-CM | POA: Diagnosis not present

## 2012-06-18 DIAGNOSIS — H35039 Hypertensive retinopathy, unspecified eye: Secondary | ICD-10-CM | POA: Diagnosis not present

## 2012-06-20 DIAGNOSIS — Z9861 Coronary angioplasty status: Secondary | ICD-10-CM | POA: Diagnosis not present

## 2012-06-23 DIAGNOSIS — Z9861 Coronary angioplasty status: Secondary | ICD-10-CM | POA: Diagnosis not present

## 2012-06-25 DIAGNOSIS — Z9861 Coronary angioplasty status: Secondary | ICD-10-CM | POA: Diagnosis not present

## 2012-06-27 DIAGNOSIS — Z9861 Coronary angioplasty status: Secondary | ICD-10-CM | POA: Diagnosis not present

## 2012-06-30 DIAGNOSIS — Z9861 Coronary angioplasty status: Secondary | ICD-10-CM | POA: Diagnosis not present

## 2012-07-02 DIAGNOSIS — Z9861 Coronary angioplasty status: Secondary | ICD-10-CM | POA: Diagnosis not present

## 2012-07-04 DIAGNOSIS — Z9861 Coronary angioplasty status: Secondary | ICD-10-CM | POA: Diagnosis not present

## 2012-07-09 DIAGNOSIS — Z9861 Coronary angioplasty status: Secondary | ICD-10-CM | POA: Diagnosis not present

## 2012-07-11 DIAGNOSIS — Z9861 Coronary angioplasty status: Secondary | ICD-10-CM | POA: Diagnosis not present

## 2012-07-14 DIAGNOSIS — Z9861 Coronary angioplasty status: Secondary | ICD-10-CM | POA: Diagnosis not present

## 2012-07-16 DIAGNOSIS — Z9861 Coronary angioplasty status: Secondary | ICD-10-CM | POA: Diagnosis not present

## 2012-07-18 DIAGNOSIS — Z9861 Coronary angioplasty status: Secondary | ICD-10-CM | POA: Diagnosis not present

## 2012-07-21 DIAGNOSIS — Z9861 Coronary angioplasty status: Secondary | ICD-10-CM | POA: Diagnosis not present

## 2012-07-23 DIAGNOSIS — Z9861 Coronary angioplasty status: Secondary | ICD-10-CM | POA: Diagnosis not present

## 2012-07-25 DIAGNOSIS — Z9861 Coronary angioplasty status: Secondary | ICD-10-CM | POA: Diagnosis not present

## 2012-07-28 DIAGNOSIS — Z9861 Coronary angioplasty status: Secondary | ICD-10-CM | POA: Diagnosis not present

## 2012-07-30 DIAGNOSIS — Z9861 Coronary angioplasty status: Secondary | ICD-10-CM | POA: Diagnosis not present

## 2012-08-01 DIAGNOSIS — Z9861 Coronary angioplasty status: Secondary | ICD-10-CM | POA: Diagnosis not present

## 2012-08-04 DIAGNOSIS — Z9861 Coronary angioplasty status: Secondary | ICD-10-CM | POA: Diagnosis not present

## 2012-08-06 DIAGNOSIS — Z9861 Coronary angioplasty status: Secondary | ICD-10-CM | POA: Diagnosis not present

## 2012-08-08 DIAGNOSIS — Z9861 Coronary angioplasty status: Secondary | ICD-10-CM | POA: Diagnosis not present

## 2012-08-11 DIAGNOSIS — Z9861 Coronary angioplasty status: Secondary | ICD-10-CM | POA: Diagnosis not present

## 2012-08-13 DIAGNOSIS — Z9861 Coronary angioplasty status: Secondary | ICD-10-CM | POA: Diagnosis not present

## 2012-08-13 DIAGNOSIS — E119 Type 2 diabetes mellitus without complications: Secondary | ICD-10-CM | POA: Diagnosis not present

## 2012-08-18 DIAGNOSIS — Z9861 Coronary angioplasty status: Secondary | ICD-10-CM | POA: Diagnosis not present

## 2012-08-18 DIAGNOSIS — E119 Type 2 diabetes mellitus without complications: Secondary | ICD-10-CM | POA: Diagnosis not present

## 2012-08-20 DIAGNOSIS — Z9861 Coronary angioplasty status: Secondary | ICD-10-CM | POA: Diagnosis not present

## 2012-08-20 DIAGNOSIS — E119 Type 2 diabetes mellitus without complications: Secondary | ICD-10-CM | POA: Diagnosis not present

## 2012-08-22 DIAGNOSIS — Z9861 Coronary angioplasty status: Secondary | ICD-10-CM | POA: Diagnosis not present

## 2012-08-22 DIAGNOSIS — E119 Type 2 diabetes mellitus without complications: Secondary | ICD-10-CM | POA: Diagnosis not present

## 2012-08-27 DIAGNOSIS — Z9861 Coronary angioplasty status: Secondary | ICD-10-CM | POA: Diagnosis not present

## 2012-08-27 DIAGNOSIS — E119 Type 2 diabetes mellitus without complications: Secondary | ICD-10-CM | POA: Diagnosis not present

## 2012-08-29 DIAGNOSIS — E119 Type 2 diabetes mellitus without complications: Secondary | ICD-10-CM | POA: Diagnosis not present

## 2012-08-29 DIAGNOSIS — Z9861 Coronary angioplasty status: Secondary | ICD-10-CM | POA: Diagnosis not present

## 2012-08-30 IMAGING — CR DG CHEST 2V
2 series · 2 of 2 positions shown · non-contrast
Comparison: 05/24/2010

CLINICAL DATA: Status post CABG.  Soreness in chest.

CHEST - 2 VIEW

[w chest pa]
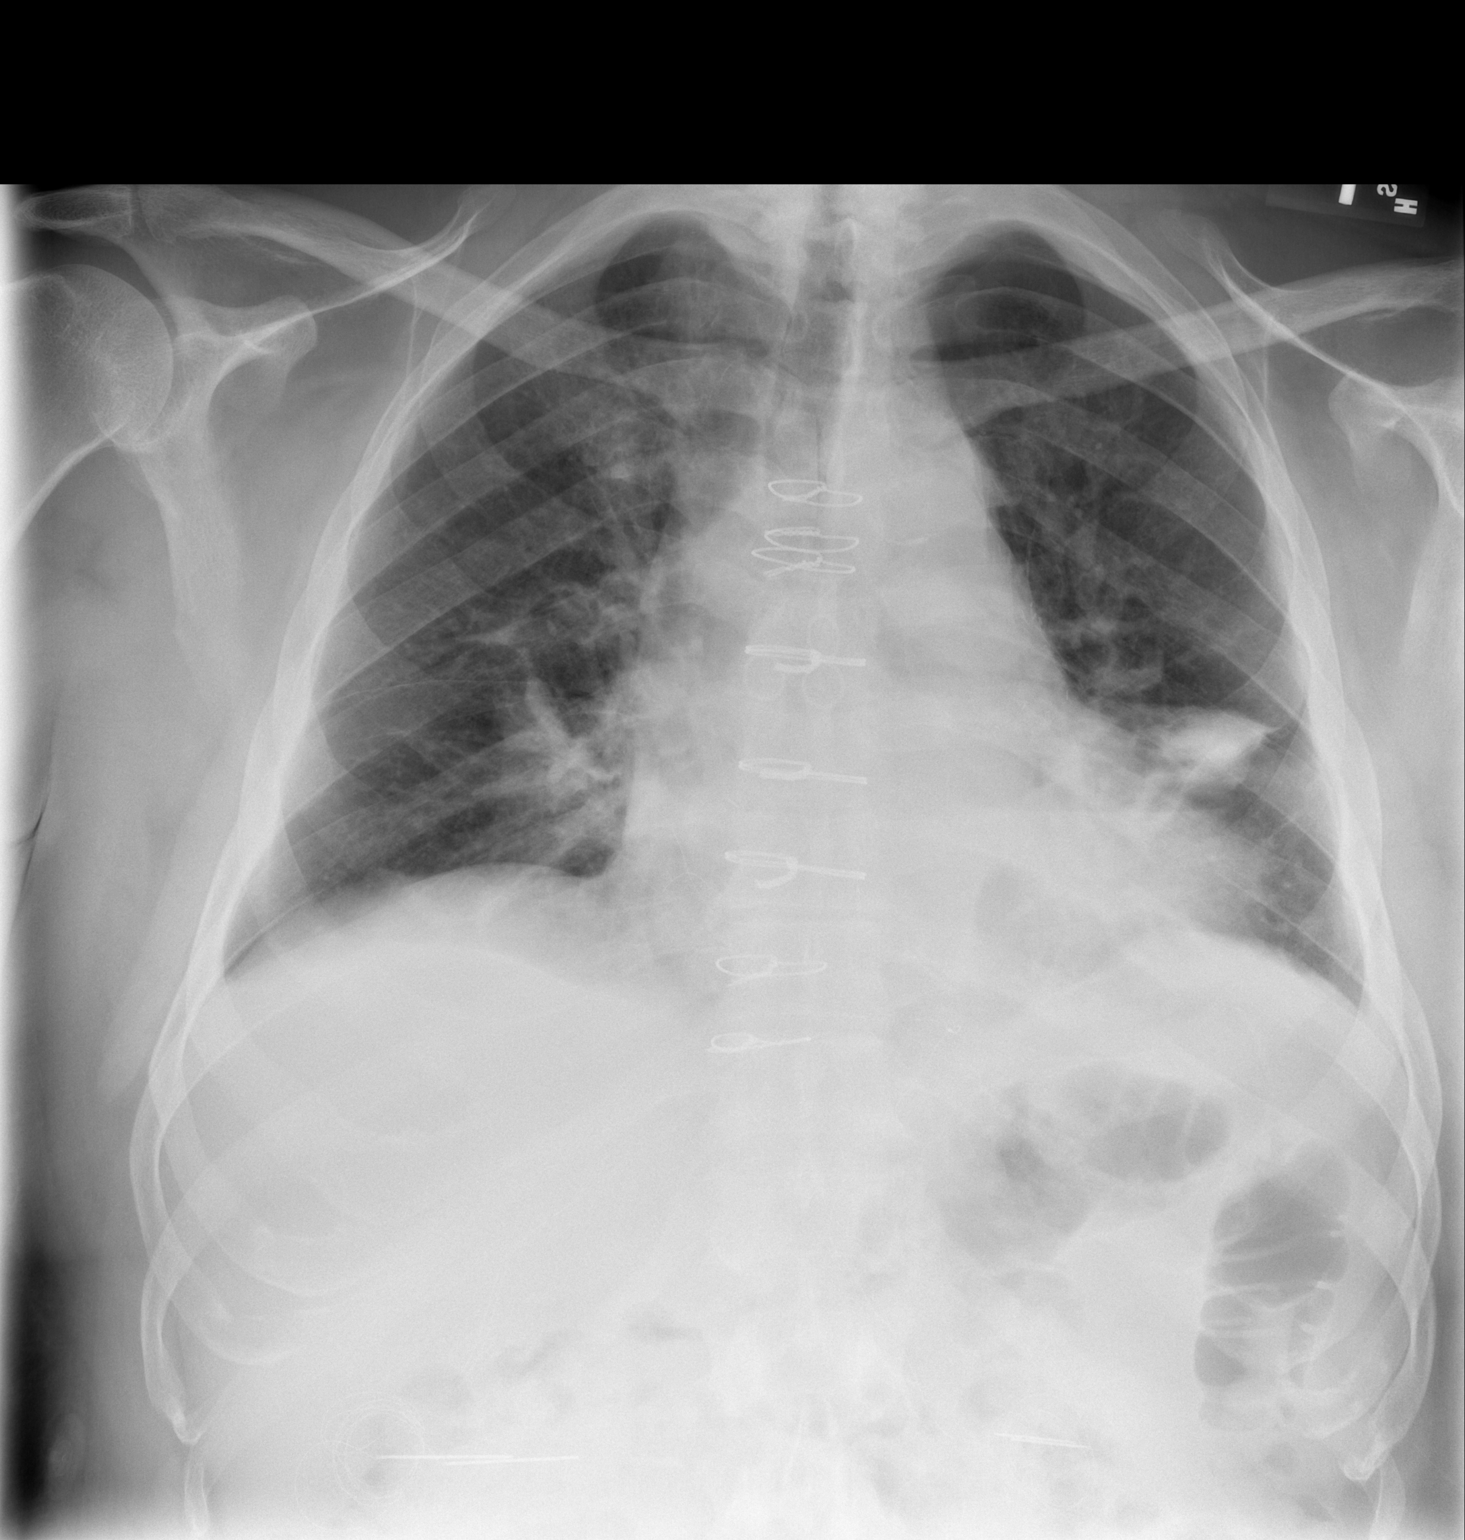

[w chest lat]
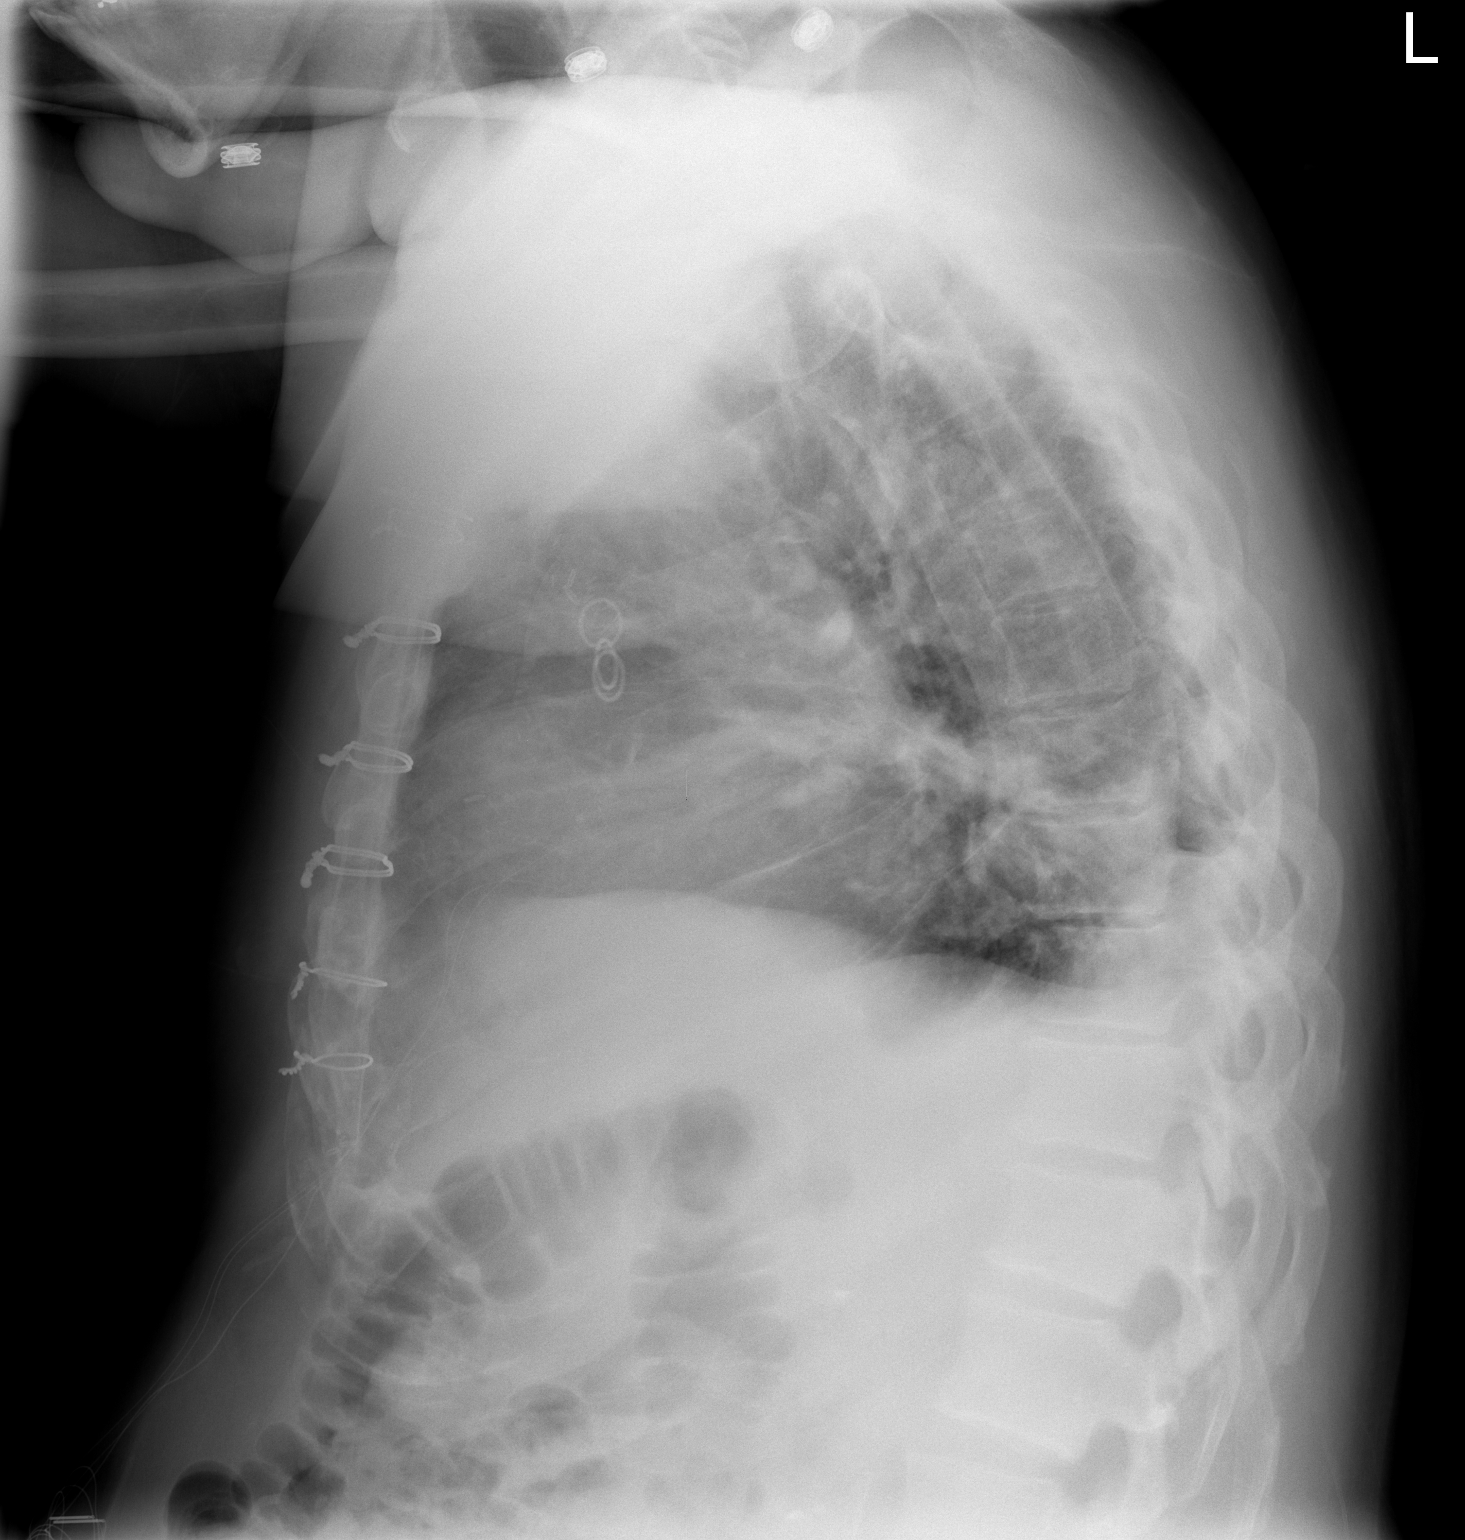

[2 of 2 positions shown; findings below may reference images not displayed]

FINDINGS: The patient is status post median sternotomy CABG
procedure.

Cardiac enlargement is unchanged from previous exam.

Small left apical pneumothorax measures approximately 6-7 mm.  This
is decreased in volume from previous exam.

Bibasilar atelectasis, left greater than right is noted and appears
stable.  There are small pleural effusions.
IMPRESSION: 1.  Decrease in volume of left apical pneumothorax.
2.  Bibasilar atelectasis.

## 2012-09-01 DIAGNOSIS — E119 Type 2 diabetes mellitus without complications: Secondary | ICD-10-CM | POA: Diagnosis not present

## 2012-09-01 DIAGNOSIS — Z9861 Coronary angioplasty status: Secondary | ICD-10-CM | POA: Diagnosis not present

## 2012-09-03 DIAGNOSIS — E119 Type 2 diabetes mellitus without complications: Secondary | ICD-10-CM | POA: Diagnosis not present

## 2012-09-03 DIAGNOSIS — Z9861 Coronary angioplasty status: Secondary | ICD-10-CM | POA: Diagnosis not present

## 2012-09-05 DIAGNOSIS — Z9861 Coronary angioplasty status: Secondary | ICD-10-CM | POA: Diagnosis not present

## 2012-09-05 DIAGNOSIS — E119 Type 2 diabetes mellitus without complications: Secondary | ICD-10-CM | POA: Diagnosis not present

## 2012-09-08 DIAGNOSIS — Z9861 Coronary angioplasty status: Secondary | ICD-10-CM | POA: Diagnosis not present

## 2012-09-08 DIAGNOSIS — E782 Mixed hyperlipidemia: Secondary | ICD-10-CM | POA: Diagnosis not present

## 2012-09-08 DIAGNOSIS — I10 Essential (primary) hypertension: Secondary | ICD-10-CM | POA: Diagnosis not present

## 2012-09-08 DIAGNOSIS — E119 Type 2 diabetes mellitus without complications: Secondary | ICD-10-CM | POA: Diagnosis not present

## 2012-09-08 DIAGNOSIS — Z0000 Encounter for general adult medical examination without abnormal findings: Secondary | ICD-10-CM | POA: Diagnosis not present

## 2012-09-19 IMAGING — CR DG CHEST 2V
2 series · 2 of 2 positions shown · non-contrast
Comparison: 05/25/2010

CLINICAL DATA: Heart surgery May 2010.

CHEST - 2 VIEW

[view not recorded (1 of 2)]
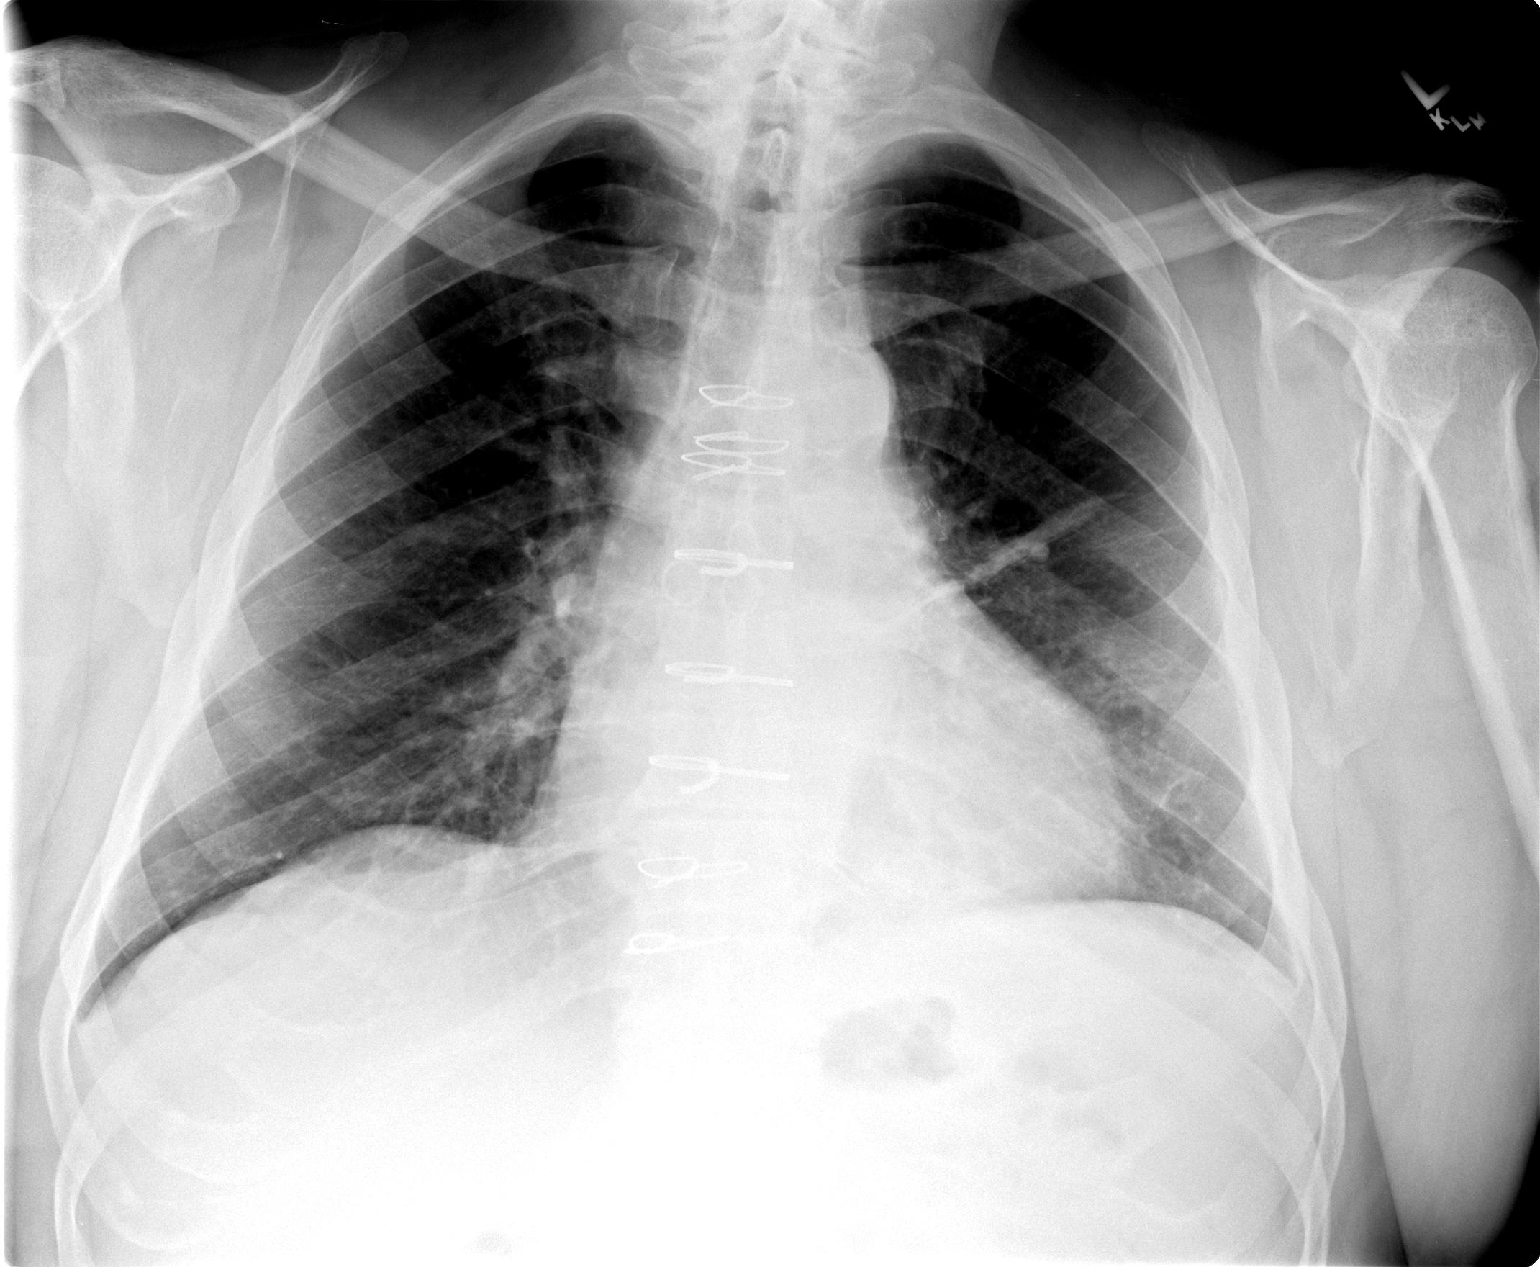

[view not recorded (2 of 2)]
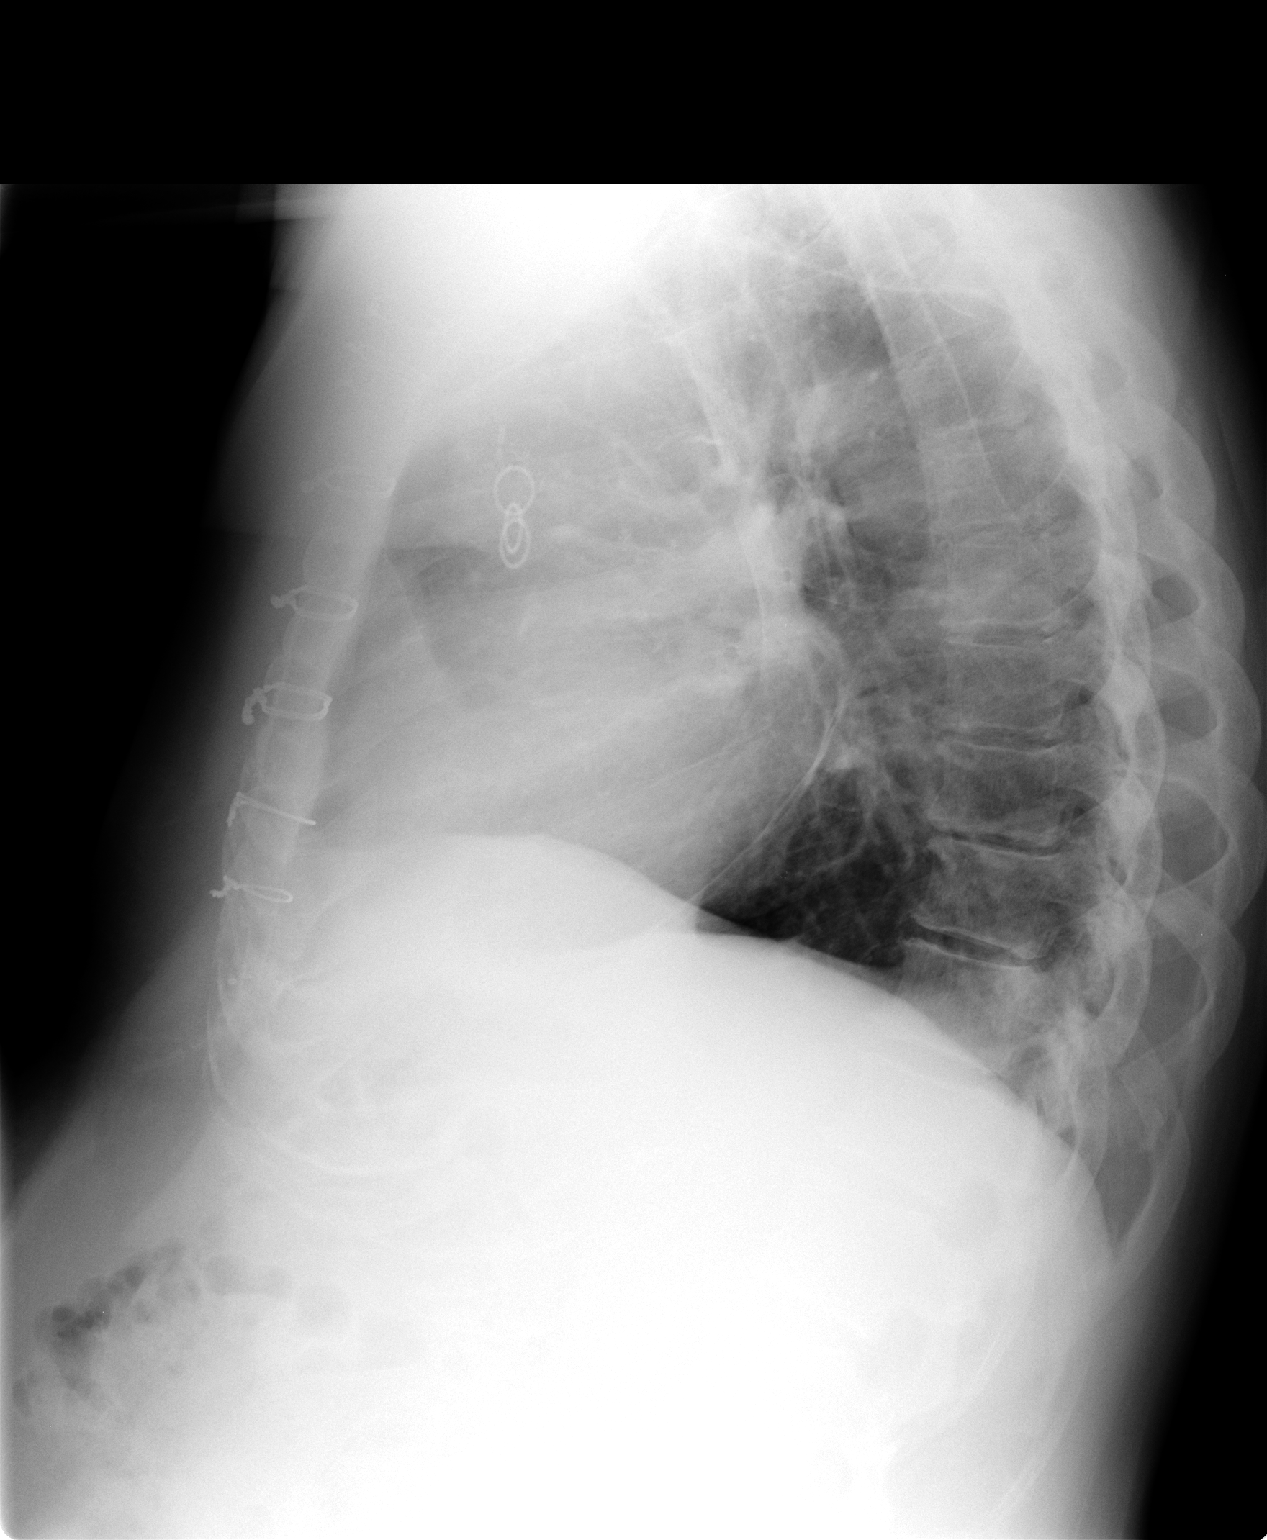

[2 of 2 positions shown; findings below may reference images not displayed]

FINDINGS: Trachea is midline.  Heart size stable.  Sternotomy wires
are unchanged in position.  There is linear subsegmental
atelectasis in the left perihilar region.  Minimal left basilar
airspace disease. Tiny left pleural effusion.  Left apical
pneumothorax has resolved in the interval.
IMPRESSION: 1.  Improving left basilar airspace disease.
2.  Resolved left pneumothorax.
3.  Tiny left pleural effusion.

## 2012-10-30 DIAGNOSIS — M51379 Other intervertebral disc degeneration, lumbosacral region without mention of lumbar back pain or lower extremity pain: Secondary | ICD-10-CM | POA: Diagnosis not present

## 2012-10-30 DIAGNOSIS — M47817 Spondylosis without myelopathy or radiculopathy, lumbosacral region: Secondary | ICD-10-CM | POA: Diagnosis not present

## 2012-10-30 DIAGNOSIS — M5137 Other intervertebral disc degeneration, lumbosacral region: Secondary | ICD-10-CM | POA: Diagnosis not present

## 2012-10-30 DIAGNOSIS — M4120 Other idiopathic scoliosis, site unspecified: Secondary | ICD-10-CM | POA: Diagnosis not present

## 2012-10-30 DIAGNOSIS — M47814 Spondylosis without myelopathy or radiculopathy, thoracic region: Secondary | ICD-10-CM | POA: Diagnosis not present

## 2012-10-30 DIAGNOSIS — IMO0002 Reserved for concepts with insufficient information to code with codable children: Secondary | ICD-10-CM | POA: Diagnosis not present

## 2012-11-01 ENCOUNTER — Encounter: Payer: Self-pay | Admitting: *Deleted

## 2012-11-03 ENCOUNTER — Ambulatory Visit (INDEPENDENT_AMBULATORY_CARE_PROVIDER_SITE_OTHER): Payer: Medicare Other | Admitting: Cardiology

## 2012-11-03 ENCOUNTER — Encounter: Payer: Self-pay | Admitting: Cardiovascular Disease

## 2012-11-03 VITALS — BP 158/72 | Ht 74.0 in | Wt 226.2 lb

## 2012-11-03 DIAGNOSIS — I8310 Varicose veins of unspecified lower extremity with inflammation: Secondary | ICD-10-CM | POA: Diagnosis not present

## 2012-11-03 DIAGNOSIS — I2589 Other forms of chronic ischemic heart disease: Secondary | ICD-10-CM

## 2012-11-03 DIAGNOSIS — I255 Ischemic cardiomyopathy: Secondary | ICD-10-CM | POA: Insufficient documentation

## 2012-11-03 DIAGNOSIS — Z9861 Coronary angioplasty status: Secondary | ICD-10-CM

## 2012-11-03 DIAGNOSIS — I2510 Atherosclerotic heart disease of native coronary artery without angina pectoris: Secondary | ICD-10-CM

## 2012-11-03 DIAGNOSIS — I10 Essential (primary) hypertension: Secondary | ICD-10-CM | POA: Diagnosis not present

## 2012-11-03 DIAGNOSIS — E785 Hyperlipidemia, unspecified: Secondary | ICD-10-CM

## 2012-11-03 HISTORY — DX: Ischemic cardiomyopathy: I25.5

## 2012-11-03 MED ORDER — TICAGRELOR 90 MG PO TABS: 90.0000 mg | ORAL_TABLET | Freq: Two times a day (BID) | ORAL | Status: DC

## 2012-11-03 MED ORDER — HYDROCHLOROTHIAZIDE 25 MG PO TABS: 25.0000 mg | ORAL_TABLET | Freq: Every day | ORAL | Status: DC

## 2012-11-03 MED ORDER — CARVEDILOL 25 MG PO TABS: 25.0000 mg | ORAL_TABLET | Freq: Two times a day (BID) | ORAL | Status: DC

## 2012-11-03 NOTE — Patient Instructions (Addendum)
Your physician recommends that you schedule a follow-up appointment in: 6 months with Dr Herbie Baltimore Increase Coreg to 25 mg twice a day Have lipid profile done at your doctors office next  month

## 2012-11-03 NOTE — Assessment & Plan Note (Signed)
BP elevated today

## 2012-11-03 NOTE — Assessment & Plan Note (Signed)
No CHF- 

## 2012-11-03 NOTE — Assessment & Plan Note (Signed)
No angina 

## 2012-11-03 NOTE — Assessment & Plan Note (Signed)
4/5 grafts occluded (asymptomatic abnormal Myoview) Feb 2014

## 2012-11-03 NOTE — Progress Notes (Signed)
11/03/2012 [name]   1940-06-25  [id number]  Primary Physicia HASANAJ,XAJE A, MD Primary Cardiologist: Dr Herbie Baltimore  HPI:  The patient is a 72 year old male, who we saw in February 2012 as a preoperative clearance prior to having knee replacement. Myoview was done and was abnormal. He was asymptomatic. Subsequent catheterization revealed severe coronary disease, and he had bypass grafting x5 with an LIMA to LAD, SVG to the RCA, SVG to the ramus intermedius, SVG to the OM1 and OM2 sequentially. He had no anginal symptoms prior to this. A routine Myoview was done on March 25, 2012, in followup, which was ultimately interpreted as high risk with anterior ischemia and ST depression with exercise. The patient denied having any anginal symptoms. He was set up for an outpatient catheterization, which was done by Dr. Herbie Baltimore on April 02, 2012. This revealed occlusion of his vein grafts with a patent LIMA to the LAD. He underwent an intervention with DES to the SVG to ramus intermedius and the native OM1 on April 08, 2012. His EF has been normal. He has done well on medical Rx. Dr Herbie Baltimore saw him in April and he is here for a follow up. He continues to do well. He denies any angina or SOB. He is tolerating his medications well. He had had some dyspnea on Brlinita but this has improved.     Current Outpatient Prescriptions  Medication Sig Dispense Refill  . aspirin 81 MG tablet Take 1 tablet (81 mg total) by mouth daily.  30 tablet    . fish oil-omega-3 fatty acids 1000 MG capsule Take 1 g by mouth 3 (three) times daily.      . hydrochlorothiazide (HYDRODIURIL) 25 MG tablet Take 1 tablet (25 mg total) by mouth daily.  90 tablet  3  . losartan (COZAAR) 100 MG tablet Take 100 mg by mouth daily.      . metFORMIN (GLUCOPHAGE) 500 MG tablet Take 1 tablet (500 mg total) by mouth 2 (two) times daily with a meal.      . nitroGLYCERIN (NITROSTAT) 0.4 MG SL tablet Place 1 tablet (0.4 mg total) under the  tongue every 5 (five) minutes as needed for chest pain.  25 tablet  2  . simvastatin (ZOCOR) 40 MG tablet Take 40 mg by mouth every evening.      . Ticagrelor (BRILINTA) 90 MG TABS tablet Take 1 tablet (90 mg total) by mouth 2 (two) times daily.  180 tablet  3  . carvedilol (COREG) 25 MG tablet Take 1 tablet (25 mg total) by mouth 2 (two) times daily.  180 tablet  3   No current facility-administered medications for this visit.    No Known Allergies  History   Social History  . Marital Status: Married    Spouse Name: N/A    Number of Children: N/A  . Years of Education: N/A   Occupational History  . Not on file.   Social History Main Topics  . Smoking status: Never Smoker   . Smokeless tobacco: Not on file  . Alcohol Use: No  . Drug Use: No  . Sexual Activity: Not on file   Other Topics Concern  . Not on file   Social History Narrative  . No narrative on file     Review of Systems: General: negative for chills, fever, night sweats or weight changes.  Cardiovascular: negative for chest pain, dyspnea on exertion, edema, orthopnea, palpitations, paroxysmal nocturnal dyspnea or shortness of breath Dermatological: negative for rash  Respiratory: negative for cough or wheezing Urologic: negative for hematuria Abdominal: negative for nausea, vomiting, diarrhea, bright red blood per rectum, melena, or hematemesis Neurologic: negative for visual changes, syncope, or dizziness All other systems reviewed and are otherwise negative except as noted above.    Blood pressure 158/72, height 6\' 2"  (1.88 m), weight 226 lb 3.2 oz (102.604 kg).  General appearance: alert, cooperative and no distress Lungs: clear to auscultation bilaterally Heart: regular rate and rhythm Extremities: no edema  EKG NSR, PVCs  ASSESSMENT AND PLAN:   CAD, CABG X 18 May 2010, progression at cath 04/02/12 No angina  S/P elective  PTCA / DES to SVG-RI and OM1 04/08/12 4/5 grafts occluded (asymptomatic  abnormal Myoview) Feb 2014  HTN (hypertension) B/P elevated today  Dyslipidemia Due for lipids  Cardiomyopathy, ischemic- moderate LVD at cath 2/143 No CHF   PLAN  I did increase [name]'s Coreg to 25 mg BID. He will follow up with Dr Herbie Baltimore in 6 months. I asked him to et a lipid panel and a CMET when he sees his primary care provider next month.  [place] [facility] KPA-C 11/03/2012 4:19 PM

## 2012-11-03 NOTE — Assessment & Plan Note (Signed)
Due for lipids.  

## 2012-11-11 DIAGNOSIS — I8310 Varicose veins of unspecified lower extremity with inflammation: Secondary | ICD-10-CM | POA: Diagnosis not present

## 2012-11-24 ENCOUNTER — Encounter: Payer: Self-pay | Admitting: Cardiology

## 2012-12-09 DIAGNOSIS — Z125 Encounter for screening for malignant neoplasm of prostate: Secondary | ICD-10-CM | POA: Diagnosis not present

## 2012-12-09 DIAGNOSIS — E782 Mixed hyperlipidemia: Secondary | ICD-10-CM | POA: Diagnosis not present

## 2012-12-09 DIAGNOSIS — I10 Essential (primary) hypertension: Secondary | ICD-10-CM | POA: Diagnosis not present

## 2012-12-22 DIAGNOSIS — IMO0002 Reserved for concepts with insufficient information to code with codable children: Secondary | ICD-10-CM | POA: Diagnosis not present

## 2012-12-22 DIAGNOSIS — M999 Biomechanical lesion, unspecified: Secondary | ICD-10-CM | POA: Diagnosis not present

## 2012-12-23 DIAGNOSIS — IMO0002 Reserved for concepts with insufficient information to code with codable children: Secondary | ICD-10-CM | POA: Diagnosis not present

## 2012-12-23 DIAGNOSIS — M999 Biomechanical lesion, unspecified: Secondary | ICD-10-CM | POA: Diagnosis not present

## 2012-12-24 DIAGNOSIS — IMO0002 Reserved for concepts with insufficient information to code with codable children: Secondary | ICD-10-CM | POA: Diagnosis not present

## 2012-12-24 DIAGNOSIS — M999 Biomechanical lesion, unspecified: Secondary | ICD-10-CM | POA: Diagnosis not present

## 2012-12-26 DIAGNOSIS — Z23 Encounter for immunization: Secondary | ICD-10-CM | POA: Diagnosis not present

## 2013-01-30 ENCOUNTER — Telehealth: Payer: Self-pay | Admitting: Cardiology

## 2013-01-30 NOTE — Telephone Encounter (Signed)
Spoke to [facility]. Mr [name] - Cardiac Rehab order  Second page is unavailable  in his chart for [place].  Darl Pikes request to fax a copy of the order or have Dr Herbie Baltimore resign the order.  RN located the order in the patient 's chart- notified Darl Pikes- faxed a copy of the Cardiac Rehab order-- 05/2012

## 2013-03-10 DIAGNOSIS — I10 Essential (primary) hypertension: Secondary | ICD-10-CM | POA: Diagnosis not present

## 2013-03-10 DIAGNOSIS — IMO0001 Reserved for inherently not codable concepts without codable children: Secondary | ICD-10-CM | POA: Diagnosis not present

## 2013-05-05 DIAGNOSIS — M48061 Spinal stenosis, lumbar region without neurogenic claudication: Secondary | ICD-10-CM | POA: Diagnosis not present

## 2013-05-05 DIAGNOSIS — M5137 Other intervertebral disc degeneration, lumbosacral region: Secondary | ICD-10-CM | POA: Diagnosis not present

## 2013-05-11 ENCOUNTER — Ambulatory Visit: Payer: Medicare Other | Admitting: Cardiology

## 2013-05-14 DIAGNOSIS — M48061 Spinal stenosis, lumbar region without neurogenic claudication: Secondary | ICD-10-CM | POA: Diagnosis not present

## 2013-05-22 DIAGNOSIS — M5137 Other intervertebral disc degeneration, lumbosacral region: Secondary | ICD-10-CM | POA: Diagnosis not present

## 2013-05-25 ENCOUNTER — Encounter: Payer: Self-pay | Admitting: Cardiology

## 2013-05-25 ENCOUNTER — Ambulatory Visit (INDEPENDENT_AMBULATORY_CARE_PROVIDER_SITE_OTHER): Payer: Medicare Other | Admitting: Cardiology

## 2013-05-25 VITALS — BP 132/72 | HR 59 | Ht 74.0 in | Wt 225.1 lb

## 2013-05-25 DIAGNOSIS — I2510 Atherosclerotic heart disease of native coronary artery without angina pectoris: Secondary | ICD-10-CM

## 2013-05-25 DIAGNOSIS — I10 Essential (primary) hypertension: Secondary | ICD-10-CM | POA: Diagnosis not present

## 2013-05-25 DIAGNOSIS — Z955 Presence of coronary angioplasty implant and graft: Secondary | ICD-10-CM

## 2013-05-25 DIAGNOSIS — I2589 Other forms of chronic ischemic heart disease: Secondary | ICD-10-CM | POA: Diagnosis not present

## 2013-05-25 DIAGNOSIS — Z9861 Coronary angioplasty status: Secondary | ICD-10-CM

## 2013-05-25 DIAGNOSIS — E669 Obesity, unspecified: Secondary | ICD-10-CM

## 2013-05-25 DIAGNOSIS — I255 Ischemic cardiomyopathy: Secondary | ICD-10-CM

## 2013-05-25 DIAGNOSIS — E785 Hyperlipidemia, unspecified: Secondary | ICD-10-CM

## 2013-05-25 MED ORDER — CARVEDILOL 25 MG PO TABS: 25.0000 mg | ORAL_TABLET | Freq: Two times a day (BID) | ORAL | Status: DC

## 2013-05-25 MED ORDER — TICAGRELOR 90 MG PO TABS: 90.0000 mg | ORAL_TABLET | Freq: Two times a day (BID) | ORAL | Status: DC

## 2013-05-25 MED ORDER — SIMVASTATIN 40 MG PO TABS: 40.0000 mg | ORAL_TABLET | Freq: Every evening | ORAL | Status: DC

## 2013-05-25 NOTE — Patient Instructions (Addendum)
Your physician wants you to follow-up in 6 month Dr Ellyn Hack (30 min appointment) You will receive a reminder letter in the mail two months in advance. If you don't receive a letter, please call our office to schedule the follow-up appointment.

## 2013-05-28 ENCOUNTER — Encounter: Payer: Self-pay | Admitting: Cardiology

## 2013-05-28 NOTE — Assessment & Plan Note (Signed)
He was initially doing very well weight loss, but has somewhat plateaued. He is doing quite well the exercise. We talked a lot about dietary modifications as well.

## 2013-05-28 NOTE — Progress Notes (Signed)
PATIENTDamond George MRN: [id number]  DOB: [birth year]   DOV:05/28/2013 PCP: Neale Burly, MD  Clinic Note: Chief Complaint  Patient presents with  . 6 month visit    no chest pain,no sob no edema    HPI: [name] is a 73 y.o.  male with a PMH below who presents today for annual followup of a complicated CAD. I initially met him when he presented for preoperative risk assessment for surgery. He had an abnormal Myoview that resulted in a cardiac catheterization. He is found to have multivessel CAD in February of 2012, and was referred for CABG. Unfortunately a followup Myoview in 2014 was again high-risk and he had an occluded vein graft to the RCA with a native RCA occlusion, along with an occluded vein graft to the OM, severe insertion disease in the SVG-ramus. This resulted in staged PCI of the ramus graft and the native circumflex OM.  He really has never had angina symptoms, has never had heart failure either with a normal EFbt echocardiogram. He did have moderate LV dysfunction by cath in February of 2014. I last saw him in April 2014 and was doing very well at that time, and he saw Kerin Ransom, [place] in September  He did cardiac rehabilitation after his PCI, and has remained active.  Interval History: He presents as a relatively asymptomatic. No major symptoms. His wife is concerned about his lack of activity, but he states it is really more related to him having back pains and hip pains. He doesn't do a lot of activity he would like to do going to the [facility] 3-4 times a week doing various interval exercises and walking. Denies any exertional or rest angina type symptoms or dyspnea. Denies any PND, orthopnea or edema. Unfortunately he is put back on some of the weight that he lost, but he attributes it to heavier shoes and clothes today.  The remainder of Cardiovascular ROS:  No palpitations, lightheadedness, dizziness, weakness or syncope/near syncope. No TIA/amaurosis fugax  symptoms. No melena, hematochezia, hematuria, or epistaxis. No claudication  Past Medical History  Diagnosis Date  . Diabetes type 2, controlled   . HTN (hypertension)   . Dyslipidemia   . DJD (degenerative joint disease) 9/12    Rt TKR  . Iron deficiency anemia   . Osteoarthritis   . Cardiomyopathy, ischemic- moderate LVD (EF of roughly 45%) at cath 2/143 11/03/2012  . 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 S/P CABG x 5     LIMA-LAD, SVG-ramus, SVG-RCA, SVG-OM1-OM 2,  . CAD (coronary artery disease) of bypass graft February 2014    Cath  for abnormal Myoview - LIMA-LAD patent with ~80-90% distal LAD (small); Native Cx ~95-99% just prior to OM1-2, RI - large lateral branch 100%, RCA 100% mid; LIMA-LAD patent, SVG-RCA flush 100%, SVG-OM1-2 flush 100%, SVG- RI anastomotic 90% stenosis; See stent placement below 04/08/2012   . S/P drug eluting coronary stent placement February 2014    S/P elective  PTCA / DES to SVG-RI and OM1 04/08/12  . Abnormal nuclear stress test February 2014    high-risk, referred for redo cath -- noted above    Prior Cardiovascular Evaluation and Procedures:  Procedure Laterality Date  . Coronary artery bypass graft  4/12    x 5 (LIMA-LAD, SVG- RI,. SVG-OM1-OM2, SVG-RCA)  .  Percutaneous coronary stent intervention (pci-s)  04/08/12    Promus Premier DES 2.25 mm x 12 mm - Mid Circumflex-OM1; Promus Premier DES 2.5 mm x 16 mm -- anastomotic SVG-RI  . Cardiac catheterization  03/2010    revealed multivessel coronary disease.  . Cardiac catheterization  03/2012    which revealed occluded vein graft to the right with a natice RCA occlusion as well, an occluded vein graft to the obtuse marginal with severe insertional disease of the vein graft to the ramus intermedius, but a patent LAD with nonspecific LAD disease  .  Transthoracic echocardiogram  July 2012    EF 55% with inferior hypokinesis, mild right ventricle dilation but no significant valvular lesions, just aortic sclerosis.   Marland Kitchen Nm myoview ltd  February 2014    pre-op stress test showed some ischemia then he had  a routine follow up stress test for some dyspnea and fatigue (February 2014) that showed ischemia.    No Known Allergies  Current Outpatient Prescriptions  Medication Sig Dispense Refill  . aspirin 81 MG tablet Take 1 tablet (81 mg total) by mouth daily.  30 tablet    . carvedilol (COREG) 25 MG tablet Take 1 tablet (25 mg total) by mouth 2 (two) times daily.  180 tablet  3  . fish oil-omega-3 fatty acids 1000 MG capsule Take 1 g by mouth daily.       . hydrochlorothiazide (HYDRODIURIL) 25 MG tablet Take 1 tablet (25 mg total) by mouth daily.  90 tablet  3  . losartan (COZAAR) 100 MG tablet Take 100 mg by mouth daily.      . metFORMIN (GLUCOPHAGE) 500 MG tablet Take 1 tablet (500 mg total) by mouth 2 (two) times daily with a meal.      . nitroGLYCERIN (NITROSTAT) 0.4 MG SL tablet Place 1 tablet (0.4 mg total) under the tongue every 5 (five) minutes as needed for chest pain.  25 tablet  2  . simvastatin (ZOCOR) 40 MG tablet Take 1 tablet (40 mg total) by mouth every evening.  90 tablet  3  . Ticagrelor (BRILINTA) 90 MG TABS tablet Take 1 tablet (90 mg total) by mouth 2 (two) times daily.  180 tablet  3   No current facility-administered medications for this visit.    History   Social History Narrative   He is a married father of 2. He exercises routinely at the [facility].Marland Kitchen    He quit smoking about 30 years ago and has social alcohol.     ROS: A comprehensive Review of Systems - Negative except Mild symptoms noted above. Mild arthralgias back pain  PHYSICAL EXAM BP 132/72  Pulse 59  Ht '6\' 2"'  (1.88 m)  Wt 225 lb 1.6 oz (102.105 kg)  BMI 28.89 kg/m2 General appearance: alert, cooperative, no distress, mildly obese and BMI would not  suggest obesity but simply overweight. Well-nourished. Pleasant mood and affect. Otherwise healthy-appearing. Neck: no adenopathy, no carotid bruit, no JVD and supple, symmetrical, trachea midline Lungs: clear to auscultation bilaterally, normal percussion bilaterally and Nonlabored, good air movement Heart: regular rate and rhythm, S1, S2 normal, no murmur, click, rub or gallop and normal apical impulse Abdomen: soft, non-tender; bowel sounds normal; no masses,  no organomegaly Extremities: extremities normal, atraumatic, no cyanosis or edema Pulses: 2+ and symmetric Neurologic: Alert and oriented X 3, normal strength and tone. Normal symmetric reflexes. Normal coordination and gait   Adult ECG Report  Rate: 59 ;  Rhythm: normal sinus  rhythm Minimal voltage for LVH, inferior MI, age indeterminate, normal intervals and axes  Narrative Interpretation: stable EKG  Recent Labs: not available  ASSESSMENT / PLAN: Cardiomyopathy, ischemic- moderate LVD (EF of roughly 45%) at cath 2/143 No active heart failure symptoms. He is on high-dose of beta blocker and ARB. Not requiring Lasix but is on HCTZ. Note will edema. No heart failure symptoms.  CAD, CABG X 18 May 2010, progression at cath 04/02/12 No recurrent angina after his PCI day. Unfortunately had 3/5 occluded grafts and one with significant disease, successfully treated with PCI. Native circumflex system was treated PCI as well. Would likely need followup Myoview in the near future, but lack of symptoms and his activity level, not inclined to do so for at least another year.  Continue dual antiplatelet therapy, beta blocker, ARB and statin. Aggressive diabetes control. Continue diet and excised. He needs continued weight loss.  S/P elective  PTCA / DES to SVG-RI and OM1 04/08/12   Continue on aspirin plus Brilinta. No bleeding issues.  Dyslipidemia On statin. Unfortunately labs are not available. Reportedly followed by PCP. I will ask his  PCP to send results. Goal LDL is less than 70   Obesity, unspecified He was initially doing very well weight loss, but has somewhat plateaued. He is doing quite well the exercise. We talked a lot about dietary modifications as well.  HTN (hypertension) Blood pressure was quite good today. Stable on current regimen.    Orders Placed This Encounter  Procedures  . EKG 12-Lead    Followup: 6 months -- if no labs provided by that time, will order labs  Jakera Beaupre W. Ellyn Hack, M.D., M.S. Interventional Cardiology [facility]

## 2013-05-28 NOTE — Assessment & Plan Note (Signed)
Continue on aspirin plus Brilinta. No bleeding issues.

## 2013-05-28 NOTE — Assessment & Plan Note (Signed)
No active heart failure symptoms. He is on high-dose of beta blocker and ARB. Not requiring Lasix but is on HCTZ. Note will edema. No heart failure symptoms.

## 2013-05-28 NOTE — Assessment & Plan Note (Signed)
Blood pressure was quite good today. Stable on current regimen.

## 2013-05-28 NOTE — Assessment & Plan Note (Signed)
On statin. Unfortunately labs are not available. Reportedly followed by PCP. I will ask his PCP to send results. Goal LDL is less than 70

## 2013-05-28 NOTE — Assessment & Plan Note (Addendum)
No recurrent angina after his PCI day. Unfortunately had 3/5 occluded grafts and one with significant disease, successfully treated with PCI. Native circumflex system was treated PCI as well. Would likely need followup Myoview in the near future, but lack of symptoms and his activity level, not inclined to do so for at least another year.  Continue dual antiplatelet therapy, beta blocker, ARB and statin. Aggressive diabetes control. Continue diet and excised. He needs continued weight loss.

## 2013-06-08 DIAGNOSIS — IMO0001 Reserved for inherently not codable concepts without codable children: Secondary | ICD-10-CM | POA: Diagnosis not present

## 2013-06-08 DIAGNOSIS — E782 Mixed hyperlipidemia: Secondary | ICD-10-CM | POA: Diagnosis not present

## 2013-06-08 DIAGNOSIS — I10 Essential (primary) hypertension: Secondary | ICD-10-CM | POA: Diagnosis not present

## 2013-06-29 DIAGNOSIS — H40019 Open angle with borderline findings, low risk, unspecified eye: Secondary | ICD-10-CM | POA: Diagnosis not present

## 2013-06-29 DIAGNOSIS — H35039 Hypertensive retinopathy, unspecified eye: Secondary | ICD-10-CM | POA: Diagnosis not present

## 2013-06-29 DIAGNOSIS — E119 Type 2 diabetes mellitus without complications: Secondary | ICD-10-CM | POA: Diagnosis not present

## 2013-06-29 DIAGNOSIS — H524 Presbyopia: Secondary | ICD-10-CM | POA: Diagnosis not present

## 2013-09-07 DIAGNOSIS — Z125 Encounter for screening for malignant neoplasm of prostate: Secondary | ICD-10-CM | POA: Diagnosis not present

## 2013-09-07 DIAGNOSIS — Z131 Encounter for screening for diabetes mellitus: Secondary | ICD-10-CM | POA: Diagnosis not present

## 2013-09-07 DIAGNOSIS — IMO0001 Reserved for inherently not codable concepts without codable children: Secondary | ICD-10-CM | POA: Diagnosis not present

## 2013-09-07 DIAGNOSIS — E119 Type 2 diabetes mellitus without complications: Secondary | ICD-10-CM | POA: Diagnosis not present

## 2013-09-07 DIAGNOSIS — Z136 Encounter for screening for cardiovascular disorders: Secondary | ICD-10-CM | POA: Diagnosis not present

## 2013-09-07 DIAGNOSIS — I10 Essential (primary) hypertension: Secondary | ICD-10-CM | POA: Diagnosis not present

## 2013-11-19 DIAGNOSIS — Z23 Encounter for immunization: Secondary | ICD-10-CM | POA: Diagnosis not present

## 2013-11-30 ENCOUNTER — Encounter: Payer: Self-pay | Admitting: Cardiology

## 2013-11-30 ENCOUNTER — Ambulatory Visit (INDEPENDENT_AMBULATORY_CARE_PROVIDER_SITE_OTHER): Payer: Medicare Other | Admitting: Cardiology

## 2013-11-30 VITALS — BP 130/68 | HR 59 | Ht 74.0 in | Wt 223.0 lb

## 2013-11-30 DIAGNOSIS — E1169 Type 2 diabetes mellitus with other specified complication: Secondary | ICD-10-CM | POA: Diagnosis not present

## 2013-11-30 DIAGNOSIS — I87323 Chronic venous hypertension (idiopathic) with inflammation of bilateral lower extremity: Secondary | ICD-10-CM | POA: Diagnosis not present

## 2013-11-30 DIAGNOSIS — I2510 Atherosclerotic heart disease of native coronary artery without angina pectoris: Secondary | ICD-10-CM

## 2013-11-30 DIAGNOSIS — E663 Overweight: Secondary | ICD-10-CM

## 2013-11-30 DIAGNOSIS — I25810 Atherosclerosis of coronary artery bypass graft(s) without angina pectoris: Secondary | ICD-10-CM | POA: Diagnosis not present

## 2013-11-30 DIAGNOSIS — I255 Ischemic cardiomyopathy: Secondary | ICD-10-CM

## 2013-11-30 DIAGNOSIS — E118 Type 2 diabetes mellitus with unspecified complications: Secondary | ICD-10-CM

## 2013-11-30 DIAGNOSIS — E785 Hyperlipidemia, unspecified: Secondary | ICD-10-CM

## 2013-11-30 DIAGNOSIS — Z95828 Presence of other vascular implants and grafts: Secondary | ICD-10-CM | POA: Diagnosis not present

## 2013-11-30 DIAGNOSIS — I10 Essential (primary) hypertension: Secondary | ICD-10-CM

## 2013-11-30 NOTE — Progress Notes (Signed)
PCP: HASANAJ,XAJE A, MD  Clinic Note: Chief Complaint  Patient presents with  . Follow-up    6 month follow up; no complaints    HPI: [name] is a 73 y.o. male with a PMH below who presents today for six-month followup of his CAD. In 2000 while he was seen for preoperative risk assessment for surgery. He is relatively asymptomatic at the time but had an abnormal and that a cardiac catheterization. This showed multivessel CAD and is referred for CABG. Unfortunately at high risk stress test and followup 2 years later with a Myoview. Cardiac catheterization showed occluded vein graft to the RCA with native RCA occlusion. This is a conjunction with an occluded vein graft to the OM. He has severe disease in the vein graft to the ramus intermedius. He then had PCI to the ramus intermedius graft as well as the native circumflex-OM. Interestingly, he has never really had no symptoms or heart failure symptoms. He is a normal EF by echocardiogram with moderate LV dysfunction. I last saw him in April timeframe he was doing quite well without any major symptoms. Since his last visit he has been much more active as far as exercising 3 times a week at the [facility] as well as walking on the off days. . Past Medical History  Diagnosis Date  . Diabetes type 2, controlled   . HTN (hypertension)   . Dyslipidemia   . DJD (degenerative joint disease) 9/12    Rt TKR  . Iron deficiency anemia   . Osteoarthritis   . Cardiomyopathy, ischemic- moderate LVD (EF of roughly 45%) at cath 2/143 11/03/2012  . 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 S/P CABG x 5     LIMA-LAD, SVG-ramus, SVG-RCA, SVG-OM1-OM 2,  . CAD (coronary artery disease) of bypass graft February 2014    Cath  for abnormal Myoview - LIMA-LAD patent with ~80-90% distal LAD (small); Native Cx  ~95-99% just prior to OM1-2, RI - large lateral branch 100%, RCA 100% mid; LIMA-LAD patent, SVG-RCA flush 100%, SVG-OM1-2 flush 100%, SVG- RI anastomotic 90% stenosis; See stent placement below 04/08/2012   . S/P drug eluting coronary stent placement February 2014    S/P elective  PTCA / DES to SVG-RI and OM1 04/08/12  . Abnormal nuclear stress test February 2014    high-risk, referred for redo cath -- noted above    Prior Cardiac Evaluation and Procedure History: Past Surgical History  Procedure Laterality Date  . Coronary artery bypass graft  4/12    x 5 (LIMA-LAD, SVG- RI,. SVG-OM1-OM2, SVG-RCA)  . Percutaneous coronary stent intervention (pci-s)  04/08/12    Promus Premier DES 2.25 mm x 12 mm - Mid Circumflex-OM1; Promus Premier DES 2.5 mm x 16 mm -- anastomotic SVG-RI  . Cardiac catheterization  03/2010    revealed multivessel coronary disease.  . Cardiac catheterization  03/2012    which revealed occluded vein graft to the right with a natice RCA occlusion as well, an occluded vein graft to the obtuse marginal with severe insertional disease of the vein graft to the ramus intermedius, but a patent LAD with nonspecific LAD disease  . Transthoracic echocardiogram  July 2012    EF 55% with inferior hypokinesis, mild right ventricle dilation but no significant valvular lesions, just aortic sclerosis.   Marland Kitchen  Nm myoview ltd  February 2014    pre-op stress test showed some ischemia then he had  a routine follow up stress test for some dyspnea and fatigue (February 2014) that showed ischemia.   Interval History: Progress today with no major complaints. He feels great. He is very active and denies any chest tightness or pressure with rest or exertion. No dyspnea with rest or exertion. No PND, orthopnea with a mild edema on some days. That's usually taken care of by the HCTZ. No cramping or myalgias from statins. His labs and recheck his PCP soon. No rapid or heartbeats, syncope/near syncope or  TIA/amaurosis fugax symptoms. No claudication.   ROS: A comprehensive was performed. Review of Systems  Constitutional: Positive for weight loss.       He thinks that the weights her to not accurately reflect his weight loss. He may gain some weight in the interim and then lost it back according to his report.  HENT: Negative for congestion and nosebleeds.   Respiratory: Negative for cough, shortness of breath and wheezing.   Cardiovascular: Negative for claudication.  Gastrointestinal: Negative for blood in stool and melena.  Genitourinary: Negative for hematuria.  Musculoskeletal:       Knee pains  Neurological: Negative for dizziness, sensory change, speech change, focal weakness, seizures and loss of consciousness.  Endo/Heme/Allergies: Does not bruise/bleed easily.  Psychiatric/Behavioral: Negative for depression. The patient is not nervous/anxious.   All other systems reviewed and are negative.   Current Outpatient Prescriptions on File Prior to Visit  Medication Sig Dispense Refill  . carvedilol (COREG) 25 MG tablet Take 1 tablet (25 mg total) by mouth 2 (two) times daily.  180 tablet  3  . fish oil-omega-3 fatty acids 1000 MG capsule Take 1 g by mouth daily.       . hydrochlorothiazide (HYDRODIURIL) 25 MG tablet Take 1 tablet (25 mg total) by mouth daily.  90 tablet  3  . losartan (COZAAR) 100 MG tablet Take 100 mg by mouth daily.      . metFORMIN (GLUCOPHAGE) 500 MG tablet Take 1 tablet (500 mg total) by mouth 2 (two) times daily with a meal.      . nitroGLYCERIN (NITROSTAT) 0.4 MG SL tablet Place 1 tablet (0.4 mg total) under the tongue every 5 (five) minutes as needed for chest pain.  25 tablet  2  . simvastatin (ZOCOR) 40 MG tablet Take 1 tablet (40 mg total) by mouth every evening.  90 tablet  3  . Ticagrelor (BRILINTA) 90 MG TABS tablet Take 1 tablet (90 mg total) by mouth 2 (two) times daily.  180 tablet  3   No current facility-administered medications on file prior to  visit.    ALLERGIES REVIEWED IN EPIC -- no change SOCIAL AND FAMILY HISTORY REVIEWED IN EPIC -- no change  Wt Readings from Last 3 Encounters:  11/30/13 223 lb (101.152 kg)  05/25/13 225 lb 1.6 oz (102.105 kg)  11/03/12 226 lb 3.2 oz (102.604 kg)    PHYSICAL EXAM BP 130/68  Pulse 59  Ht 6\' 2"  (1.88 m)  Wt 223 lb (101.152 kg)  BMI 28.62 kg/m2 General appearance: alert, cooperative, no distress, mildly obese and BMI would not suggest obesity but simply overweight. Well-nourished. Pleasant mood and affect. Otherwise healthy-appearing.  Neck: no adenopathy, no carotid bruit, no JVD and supple, symmetrical, trachea midline  Lungs: CTAB, normal percussion bilaterally and Nonlabored, good air movement  Heart: RRR, S1& S2 normal, no murmur, click,  rub or gallop and normal apical impulse  Abdomen: soft, non-tender; bowel sounds normal; no masses, no organomegaly  Extremities: extremities normal, atraumatic, no cyanosis or edema  Pulses: 2+ and symmetric  Neurologic: Alert and oriented X 3, normal strength and tone. Normal symmetric reflexes. Normal coordination and gait   Adult ECG Report  Rate: 59 ;  Rhythm: sinus bradycardia and Borderline criteria for LVH. Also, MI, age undetermined.  Narrative Interpretation: Stable EKG  Recent Labs:  None available. Due to be checked by PCP soon.    ASSESSMENT / PLAN: CAD, CABG X 18 May 2010, progression at cath 04/02/12 -- status post PCI to SVG had an MRI and native Circumflex-OM. He has 2 stents and a patent LIMA with 2 occluded grafts and native RCA. o evidence of angina or exertional dyspnea.  He is on an beta blocker, ARB, statin as well as Brilinta for antiplatelet therapy.  If this becomes a financial issue we can probably switch him over to Plavix for maintenance. He and his wife both feel that he would prefer to stay on the [place].   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 Status  post PCI to SVG-RI Interestingly, he was asymptomatic the time of his abnormal Myoview, he did feel somewhat better after the cavity really did not have that much in the way of symptoms at the time of his next abnormal Myoview which revealed a chronically occluded RCA with occluded vein graft to the RCA. Because of his vein grafts disease history, I would not operate on 4 to follow up Myoview. However as long as he remains asymptomatic, there is no rush.  He is on stable medication regimen. He is active in exercising with no symptoms.  Cardiomyopathy, ischemic- moderate LVD (EF of roughly 45%) at cath 2/143 Again no heart failure symptoms. On the great regimen for his condition with carvedilol and ARB. Within the lobectomy, essentially not showing any adverse signs of reduced ejection fraction. He is on HCTZ and has not required diuresis.  Essential hypertension Stable blood pressure on current regimen.  Dyslipidemia associated with type 2 diabetes mellitus On statin. Goal LDL less than 70. Monitored by PCP. No myalgias.  Obesity He really does not meet criteria for obesity at this timeframe. He continues to stay active and hopes to continue to lose weight.  Type II diabetes mellitus with complication-- CAD On oral medications. Monitor by PCP. By his report, his glucose levels seem to be relatively stable.    No orders of the defined types were placed in this encounter.   No orders of the defined types were placed in this encounter.    Followup: 6 months   HARDING,DAVID W, M.D., M.S. Interventional Cardiologist   Pager # [phone]

## 2013-11-30 NOTE — Patient Instructions (Signed)
Your physician recommends that you schedule a follow-up appointment in: 6 months with Dr Harding 

## 2013-12-02 ENCOUNTER — Encounter: Payer: Self-pay | Admitting: Cardiology

## 2013-12-02 NOTE — Assessment & Plan Note (Signed)
On statin. Goal LDL less than 70. Monitored by PCP. No myalgias.

## 2013-12-02 NOTE — Assessment & Plan Note (Signed)
Stable blood pressure on current regimen.

## 2013-12-02 NOTE — Assessment & Plan Note (Addendum)
On oral medications. Monitor by PCP. By his report, his glucose levels seem to be relatively stable.

## 2013-12-02 NOTE — Assessment & Plan Note (Signed)
He really does not meet criteria for obesity at this timeframe. He continues to stay active and hopes to continue to lose weight.

## 2013-12-02 NOTE — Assessment & Plan Note (Signed)
Again no heart failure symptoms. On the great regimen for his condition with carvedilol and ARB. Within the lobectomy, essentially not showing any adverse signs of reduced ejection fraction. He is on HCTZ and has not required diuresis.

## 2013-12-02 NOTE — Assessment & Plan Note (Signed)
He has 2 stents and a patent LIMA with 2 occluded grafts and native RCA. o evidence of angina or exertional dyspnea.  He is on an beta blocker, ARB, statin as well as Brilinta for antiplatelet therapy.  If this becomes a financial issue we can probably switch him over to Plavix for maintenance. He and his wife both feel that he would prefer to stay on the [place].

## 2013-12-02 NOTE — Assessment & Plan Note (Signed)
Interestingly, he was asymptomatic the time of his abnormal Myoview, he did feel somewhat better after the cavity really did not have that much in the way of symptoms at the time of his next abnormal Myoview which revealed a chronically occluded RCA with occluded vein graft to the RCA. Because of his vein grafts disease history, I would not operate on 4 to follow up Myoview. However as long as he remains asymptomatic, there is no rush.  He is on stable medication regimen. He is active in exercising with no symptoms.

## 2013-12-07 DIAGNOSIS — Z0000 Encounter for general adult medical examination without abnormal findings: Secondary | ICD-10-CM | POA: Diagnosis not present

## 2013-12-07 DIAGNOSIS — Z1389 Encounter for screening for other disorder: Secondary | ICD-10-CM | POA: Diagnosis not present

## 2013-12-07 DIAGNOSIS — E1165 Type 2 diabetes mellitus with hyperglycemia: Secondary | ICD-10-CM | POA: Diagnosis not present

## 2013-12-07 DIAGNOSIS — I10 Essential (primary) hypertension: Secondary | ICD-10-CM | POA: Diagnosis not present

## 2013-12-10 DIAGNOSIS — I83893 Varicose veins of bilateral lower extremities with other complications: Secondary | ICD-10-CM | POA: Diagnosis not present

## 2014-01-21 ENCOUNTER — Encounter (HOSPITAL_COMMUNITY): Payer: Self-pay | Admitting: Cardiology

## 2014-01-21 DIAGNOSIS — M25511 Pain in right shoulder: Secondary | ICD-10-CM | POA: Diagnosis not present

## 2014-01-31 ENCOUNTER — Other Ambulatory Visit: Payer: Self-pay | Admitting: Cardiology

## 2014-02-01 NOTE — Telephone Encounter (Signed)
Rx refill sent to patient pharmacy   

## 2014-05-02 ENCOUNTER — Other Ambulatory Visit: Payer: Self-pay | Admitting: Cardiology

## 2014-05-03 NOTE — Telephone Encounter (Signed)
Rx has been sent to the pharmacy electronically. ° °

## 2014-05-30 ENCOUNTER — Other Ambulatory Visit: Payer: Self-pay | Admitting: Cardiology

## 2014-05-31 ENCOUNTER — Ambulatory Visit (INDEPENDENT_AMBULATORY_CARE_PROVIDER_SITE_OTHER): Payer: Medicare Other | Admitting: Cardiology

## 2014-05-31 ENCOUNTER — Encounter: Payer: Self-pay | Admitting: Cardiology

## 2014-05-31 VITALS — BP 168/78 | HR 62 | Ht 74.0 in | Wt 224.8 lb

## 2014-05-31 DIAGNOSIS — I10 Essential (primary) hypertension: Secondary | ICD-10-CM | POA: Diagnosis not present

## 2014-05-31 DIAGNOSIS — R931 Abnormal findings on diagnostic imaging of heart and coronary circulation: Secondary | ICD-10-CM

## 2014-05-31 DIAGNOSIS — E1169 Type 2 diabetes mellitus with other specified complication: Secondary | ICD-10-CM | POA: Diagnosis not present

## 2014-05-31 DIAGNOSIS — I255 Ischemic cardiomyopathy: Secondary | ICD-10-CM

## 2014-05-31 DIAGNOSIS — I25810 Atherosclerosis of coronary artery bypass graft(s) without angina pectoris: Secondary | ICD-10-CM

## 2014-05-31 DIAGNOSIS — E785 Hyperlipidemia, unspecified: Secondary | ICD-10-CM

## 2014-05-31 DIAGNOSIS — E118 Type 2 diabetes mellitus with unspecified complications: Secondary | ICD-10-CM

## 2014-05-31 DIAGNOSIS — E669 Obesity, unspecified: Secondary | ICD-10-CM

## 2014-05-31 DIAGNOSIS — Z01810 Encounter for preprocedural cardiovascular examination: Secondary | ICD-10-CM | POA: Insufficient documentation

## 2014-05-31 DIAGNOSIS — Z95828 Presence of other vascular implants and grafts: Secondary | ICD-10-CM

## 2014-05-31 MED ORDER — CARVEDILOL 25 MG PO TABS: 25.0000 mg | ORAL_TABLET | Freq: Two times a day (BID) | ORAL | Status: DC

## 2014-05-31 MED ORDER — HYDROCHLOROTHIAZIDE 25 MG PO TABS: 25.0000 mg | ORAL_TABLET | Freq: Every day | ORAL | Status: DC

## 2014-05-31 MED ORDER — TICAGRELOR 90 MG PO TABS: 90.0000 mg | ORAL_TABLET | Freq: Two times a day (BID) | ORAL | Status: DC

## 2014-05-31 NOTE — Assessment & Plan Note (Signed)
Mostly because he does have a history of having abnormal stress test, I would not want to recheck a stress test preoperatively for a low risk surgery as I would not otherwise plan on proceeding with cardiac catheterization in this gentleman who is very active with no symptoms.

## 2014-05-31 NOTE — Assessment & Plan Note (Signed)
Monitored by PCP. He is on simvastatin. Goal LDL < 70

## 2014-05-31 NOTE — Assessment & Plan Note (Signed)
Relatively asymptomatic. Continues to exercise routinely without symptoms. He is on high-dose beta blocker and ARB along with HCTZ. A statin.

## 2014-05-31 NOTE — Progress Notes (Signed)
PCP: HASANAJ,XAJE A, MD  Clinic Note: Chief Complaint  Patient presents with  . 6 MONTH VISIT    NO CHEST PAIN , NO SOB , NO SWELLING, NO PALP  . Medical Clearance    NEED CLEARANCE TO STOP BRILINTA FOR BACK INJECTION  FOR MRI  . Coronary Artery Disease    HPI: [name] is a 74 y.o. male with a PMH below who presents today for 6 month follow-up of MV CAD.. 2012: Pre-op Evaluation for Knee Sgx - MV CAD --> CABG 05/2010 High Risk ST 2 yr later --> occluded vein graft to the RCA with native RCA occlusion. This is a conjunction with an occluded vein graft to the OM. He has severe disease in the vein graft to the ramus intermedius. He then had PCI to the ramus intermedius graft as well as the native circumflex-OM.  Past Medical History  Diagnosis Date  . Diabetes type 2, controlled   . HTN (hypertension)   . Dyslipidemia   . DJD (degenerative joint disease) 9/12    Rt TKR  . Iron deficiency anemia   . Osteoarthritis   . Cardiomyopathy, ischemic- moderate LVD (EF of roughly 45%) at cath 2/143 11/03/2012  . 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 S/P CABG x 5     LIMA-LAD, SVG-ramus, SVG-RCA, SVG-OM1-OM 2,  . CAD (coronary artery disease) of bypass graft February 2014    Cath  for abnormal Myoview - LIMA-LAD patent with ~80-90% distal LAD (small); Native Cx ~95-99% just prior to OM1-2, RI - large lateral branch 100%, RCA 100% mid; LIMA-LAD patent, SVG-RCA flush 100%, SVG-OM1-2 flush 100%, SVG- RI anastomotic 90% stenosis; See stent placement below 04/08/2012   . S/P drug eluting coronary stent placement February 2014    S/P elective  PTCA / DES to SVG-RI and OM1 04/08/12  . Abnormal nuclear stress test February 2014    high-risk, referred for redo cath -- noted above    Prior Cardiac Evaluation and Past Surgical History: Past  Surgical History  Procedure Laterality Date  . Coronary artery bypass graft  4/12    x 5 (LIMA-LAD, SVG- RI,. SVG-OM1-OM2, SVG-RCA)  . Total knee arthroplasty  9/12    Rt  . Umbilical hernia repair    . Percutaneous coronary stent intervention (pci-s)  04/08/12    Promus Premier DES 2.25 mm x 12 mm - Mid Circumflex-OM1; Promus Premier DES 2.5 mm x 16 mm -- anastomotic SVG-RI  . Cardiac catheterization  03/2010    revealed multivessel coronary disease.  . Cardiac catheterization  03/2012    which revealed occluded vein graft to the right with a natice RCA occlusion as well, an occluded vein graft to the obtuse marginal with severe insertional disease of the vein graft to the ramus intermedius, but a patent LAD with nonspecific LAD disease  . Hernia mesh removal    . Transthoracic echocardiogram  July 2012    EF 55% with inferior hypokinesis, mild right ventricle dilation but no significant valvular lesions, just aortic sclerosis.   Marland Kitchen Nm myoview ltd  February 2014    pre-op stress test showed some ischemia then he had  a routine follow up stress test for some dyspnea and fatigue (February 2014) that showed ischemia.  . Left heart catheterization with coronary angiogram N/A 04/02/2012  Procedure: LEFT HEART CATHETERIZATION WITH CORONARY ANGIOGRAM;  Surgeon: Leonie Man, MD;  Location: [facility] CATH LAB;  Service: Cardiovascular;  Laterality: N/A;  . Percutaneous coronary stent intervention (pci-s) N/A 04/08/2012    Procedure: PERCUTANEOUS CORONARY STENT INTERVENTION (PCI-S);  Surgeon: Leonie Man, MD;  Location: [facility] CATH LAB;  Service: Cardiovascular;  Laterality: N/A;    Interval History: Most pressing issue today is related to low back pain & radiculopathy.  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  Cardiovascular ROS: no chest pain or dyspnea on exertion negative for - edema, irregular heartbeat, loss of  consciousness, murmur, orthopnea, palpitations, paroxysmal nocturnal dyspnea, rapid heart rate, shortness of breath or  syncope/near syncope,  TIA/amaurosis fugax symptoms.  No claudication.  ROS: A comprehensive was performed. Review of Systems  Constitutional: Negative for malaise/fatigue.  HENT: Negative for congestion.   Respiratory: Negative for cough, shortness of breath and wheezing.   Cardiovascular: Negative for claudication and leg swelling.  Gastrointestinal: Negative for blood in stool and melena.  Genitourinary: Negative for hematuria.  Musculoskeletal: Positive for back pain (with radiculopathy).  Neurological: Negative for dizziness.  Endo/Heme/Allergies: Does not bruise/bleed easily.  All other systems reviewed and are negative.   Current Outpatient Prescriptions on File Prior to Visit  Medication Sig Dispense Refill  . fish oil-omega-3 fatty acids 1000 MG capsule Take 1 g by mouth daily.     Marland Kitchen losartan (COZAAR) 100 MG tablet Take 100 mg by mouth daily.    . metFORMIN (GLUCOPHAGE) 500 MG tablet Take 1 tablet (500 mg total) by mouth 2 (two) times daily with a meal.    . nitroGLYCERIN (NITROSTAT) 0.4 MG SL tablet Place 1 tablet (0.4 mg total) under the tongue every 5 (five) minutes as needed for chest pain. 25 tablet 2  . simvastatin (ZOCOR) 40 MG tablet Take 1 tablet (40 mg total) by mouth every evening. 90 tablet 3  . simvastatin (ZOCOR) 40 MG tablet TAKE 1 TABLET (40 MG TOTAL) BY MOUTH EVERY EVENING. 90 tablet 0   No current facility-administered medications on file prior to visit.   No Known Allergies   History  Substance Use Topics  . Smoking status: Never Smoker   . Smokeless tobacco: Not on file  . Alcohol Use: No   Family History  Problem Relation Age of Onset  . Cancer Mother     Wt Readings from Last 3 Encounters:  05/31/14 224 lb 12.8 oz (101.969 kg)  11/30/13 223 lb (101.152 kg)  05/25/13 225 lb 1.6 oz (102.105 kg)    PHYSICAL EXAM BP 168/78  mmHg  Pulse 62  Ht 6\' 2"  (1.88 m)  Wt 224 lb 12.8 oz (101.969 kg)  BMI 28.85 kg/m2 General appearance: alert, cooperative, no distress, mildly obese and BMI would not suggest obesity but simply overweight. Well-nourished. Pleasant mood and affect. Otherwise healthy-appearing.  Neck: no adenopathy, no carotid bruit, no JVD and supple, symmetrical, trachea midline  Lungs: CTAB, normal percussion bilaterally and Nonlabored, good air movement  Heart: RRR, S1& S2 normal, no murmur, click, rub or gallop and normal apical impulse  Abdomen: soft, non-tender; bowel sounds normal; no masses, no organomegaly  Extremities: extremities normal, atraumatic, no cyanosis or edema  Pulses: 2+ and symmetric  Neurologic: Alert and oriented X 3, normal strength and tone. Normal symmetric reflexes. Normal coordination and gait   Adult ECG Report  Rate: 62 ;  Rhythm: normal sinus rhythm and Inferior MI, age undertermined  Narrative Interpretation: Stable EKG  Recent Labs:  N/A  ASSESSMENT / PLAN: Problem List Items Addressed This Visit    Abnormal nuclear cardiac imaging , 03/25/12    Mostly because he does have a history of having abnormal stress test, I would not want to recheck a stress test preoperatively for a low risk surgery as I would not otherwise plan on proceeding with cardiac catheterization in this gentleman who is very active with no symptoms.      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 Status post PCI to SVG-RI (Chronic)    Relatively asymptomatic. Continues to exercise routinely without symptoms. He is on high-dose beta blocker and ARB along with HCTZ. A statin.       Relevant Medications   carvedilol (COREG) 25 MG tablet   hydrochlorothiazide (HYDRODIURIL) 25 MG tablet   Cardiomyopathy, ischemic- moderate LVD (EF of roughly 45%) at cath 2/143 - Primary (Chronic)    No active heart failure symptoms. On beta blocker and ARB. Is  on HCTZ for blood pressure, but this also does have a mild diuretic effect. Class I symptoms of any heart failure.      Relevant Medications   carvedilol (COREG) 25 MG tablet   hydrochlorothiazide (HYDRODIURIL) 25 MG tablet   Other Relevant Orders   EKG 12-Lead   Dyslipidemia associated with type 2 diabetes mellitus (Chronic)    Monitored by PCP. He is on simvastatin. Goal LDL < 70      Relevant Orders   EKG 12-Lead   Essential hypertension (Chronic)    Pressures elevated today secondary to pain. He is not had initial blood pressures in the past. We'll simply monitor      Relevant Medications   carvedilol (COREG) 25 MG tablet   hydrochlorothiazide (HYDRODIURIL) 25 MG tablet   Other Relevant Orders   EKG 12-Lead   Obesity    No longer be to obesity criteria.      Pre-operative cardiovascular examination    Planned operation is at low risk for cardiac standpoint. He would be considered LOW RISK for a LOW RISK surgery  PREOPERATIVE CARDIAC RISK ASSESSMENT   Revised Cardiac Risk Index:  High Risk Surgery: no;   Defined as Intraperitoneal, intrathoracic or suprainguinal vascular  Active CAD: no; He does have CAD, with no Sx  CHF: no;   Cerebrovascular Disease: no;   Diabetes: yes; On Insulin: no  CKD (Cr >~ 2): no;   Total: ~0.5 Estimated Risk of Adverse Outcome: LOW RISK  Estimated Risk of MI, PE, VF/VT (Cardiac Arrest), Complete Heart Block: <1 %   ACC/AHA Guidelines for "Clearance":  Step 1 - Need for Emergency Surgery: No: but relatively urgent based upon symptoms.  If Yes - go straight to OR with perioperative surveillance  Step 2 - Active Cardiac Conditions (Unstable Angina, Decompensated HF, Significant  Arrhytmias - Complete HB, Mobitz II, Symptomatic VT or SVT, Severe Aortic Stenosis - mean gradient > 40 mmHg, Valve area < 1.0 cm2):   No: No active symptoms  If Yes - Evaluate & Treat per ACC/AHA Guidelines  Step 3 -  Low Risk Surgery: Yes  If  Yes --> proceed to OR  If No --> Step 4  Step 4 - Functional Capacity >= 4 METS without symptoms: Yes  If Yes --> proceed to OR  If No --> Step 5  Step 5 --  Clinical Risk Factors (CRF)  3 or more: No:   If Yes -- assess Surgical Risk, --   (High Risk Non-cardiac), Intraabdominal or thoracic vascular surgery consider testing if it will change management.  Intermediate Risk: Proceed to OR with HR control, or consider testing if it will change management  1-2 or more CRFs: Yes  If Yes -- assess Surgical Risk, --   (High Risk Non-cardiac), Intraabdominal or thoracic vascular surgery --> Proceed to OR, or consider testing if it will change management.  Intermediate Risk: Proceed to OR with HR control, or consider testing if it will change management  Absence of ongoing active cardiac symptoms, I would not otherwise check a stress test. Therefore it would not change my management. In fact also the potential would be to postpone much needed surgery.  Revascularization preoperative for low-risk surgery is not indicated and potentially harmful.  Recommendation would be to proceed with spinal injection, having held Brilinta for 7 days pre-procedure.  If surgery is indicated, it would be okay to hold Brilinta for 7 days preprocedure, plan to proceed with the operation without any further cardiac evaluation.        Relevant Orders   EKG 12-Lead   Presence of Saphenous Vein Bypass Graft stent -- anastomotic SVG-RI lesion, Promus Premier DES 2.5 mm x 16 mm (postdilated to 0.8 in graft) (Chronic)    Because of 3 stents in at least 2 spots. The stents were done back in 2014. Distention of the relatively well healed over and he should be fine to stop his Brilinta prior to having spinal injection for back surgery.      Type II diabetes mellitus with complication-- CAD (Chronic)    On medications. Not on insulin.         Orders Placed This Encounter  Procedures  . EKG 12-Lead    Medication refills Meds ordered this encounter  Medications  . ticagrelor (BRILINTA) 90 MG TABS tablet    Sig: Take 1 tablet (90 mg total) by mouth 2 (two) times daily.    Dispense:  180 tablet    Refill:  3  . carvedilol (COREG) 25 MG tablet    Sig: Take 1 tablet (25 mg total) by mouth 2 (two) times daily.    Dispense:  180 tablet    Refill:  3  . hydrochlorothiazide (HYDRODIURIL) 25 MG tablet    Sig: Take 1 tablet (25 mg total) by mouth daily.    Dispense:  90 tablet    Refill:  3     Followup: 55months    HARDING, Leonie Green, M.D., M.S. Interventional Cardiologist   Pager # [phone]

## 2014-05-31 NOTE — Assessment & Plan Note (Signed)
Pressures elevated today secondary to pain. He is not had initial blood pressures in the past. We'll simply monitor

## 2014-05-31 NOTE — Telephone Encounter (Signed)
Rx has been sent to the pharmacy electronically. ° °

## 2014-05-31 NOTE — Assessment & Plan Note (Signed)
No active heart failure symptoms. On beta blocker and ARB. Is on HCTZ for blood pressure, but this also does have a mild diuretic effect. Class I symptoms of any heart failure.

## 2014-05-31 NOTE — Assessment & Plan Note (Signed)
No longer be to obesity criteria.

## 2014-05-31 NOTE — Assessment & Plan Note (Signed)
On medications. Not on insulin.

## 2014-05-31 NOTE — Patient Instructions (Addendum)
You may hold Brilinta 7 days prior to injection MRI and  7 days if surgery is needed.  Low risk surgery  - will send information to surgeon-  DR Tonita Cong.  No change in medications.  Your physician wants you to follow-up in 6 month DR HARDING 30 MIN APPOINTMENT. You will receive a reminder letter in the mail two months in advance. If you don't receive a letter, please call our office to schedule the follow-up appointment.

## 2014-05-31 NOTE — Assessment & Plan Note (Signed)
Planned operation is at low risk for cardiac standpoint. He would be considered LOW RISK for a LOW RISK surgery  PREOPERATIVE CARDIAC RISK ASSESSMENT   Revised Cardiac Risk Index:  High Risk Surgery: no;   Defined as Intraperitoneal, intrathoracic or suprainguinal vascular  Active CAD: no; He does have CAD, with no Sx  CHF: no;   Cerebrovascular Disease: no;   Diabetes: yes; On Insulin: no  CKD (Cr >~ 2): no;   Total: ~0.5 Estimated Risk of Adverse Outcome: LOW RISK  Estimated Risk of MI, PE, VF/VT (Cardiac Arrest), Complete Heart Block: <1 %   ACC/AHA Guidelines for "Clearance":  Step 1 - Need for Emergency Surgery: No: but relatively urgent based upon symptoms.  If Yes - go straight to OR with perioperative surveillance  Step 2 - Active Cardiac Conditions (Unstable Angina, Decompensated HF, Significant  Arrhytmias - Complete HB, Mobitz II, Symptomatic VT or SVT, Severe Aortic Stenosis - mean gradient > 40 mmHg, Valve area < 1.0 cm2):   No: No active symptoms  If Yes - Evaluate & Treat per ACC/AHA Guidelines  Step 3 -  Low Risk Surgery: Yes  If Yes --> proceed to OR  If No --> Step 4  Step 4 - Functional Capacity >= 4 METS without symptoms: Yes  If Yes --> proceed to OR  If No --> Step 5  Step 5 --  Clinical Risk Factors (CRF)   3 or more: No:   If Yes -- assess Surgical Risk, --   (High Risk Non-cardiac), Intraabdominal or thoracic vascular surgery consider testing if it will change management.  Intermediate Risk: Proceed to OR with HR control, or consider testing if it will change management  1-2 or more CRFs: Yes  If Yes -- assess Surgical Risk, --   (High Risk Non-cardiac), Intraabdominal or thoracic vascular surgery --> Proceed to OR, or consider testing if it will change management.  Intermediate Risk: Proceed to OR with HR control, or consider testing if it will change management  Absence of ongoing active cardiac symptoms, I would not  otherwise check a stress test. Therefore it would not change my management. In fact also the potential would be to postpone much needed surgery.  Revascularization preoperative for low-risk surgery is not indicated and potentially harmful.  Recommendation would be to proceed with spinal injection, having held Brilinta for 7 days pre-procedure.  If surgery is indicated, it would be okay to hold Brilinta for 7 days preprocedure, plan to proceed with the operation without any further cardiac evaluation.

## 2014-05-31 NOTE — Assessment & Plan Note (Signed)
Because of 3 stents in at least 2 spots. The stents were done back in 2014. Distention of the relatively well healed over and he should be fine to stop his Brilinta prior to having spinal injection for back surgery.

## 2014-06-04 ENCOUNTER — Other Ambulatory Visit: Payer: Self-pay | Admitting: Cardiology

## 2014-06-04 NOTE — Telephone Encounter (Signed)
Rx(s) sent to pharmacy electronically.  

## 2014-06-09 ENCOUNTER — Other Ambulatory Visit: Payer: Self-pay | Admitting: Specialist

## 2014-06-09 DIAGNOSIS — M48061 Spinal stenosis, lumbar region without neurogenic claudication: Secondary | ICD-10-CM

## 2014-06-13 DIAGNOSIS — I739 Peripheral vascular disease, unspecified: Secondary | ICD-10-CM

## 2014-06-13 HISTORY — DX: Peripheral vascular disease, unspecified: I73.9

## 2014-06-16 ENCOUNTER — Inpatient Hospital Stay
Admission: RE | Admit: 2014-06-16 | Discharge: 2014-06-16 | Disposition: A | Discharge status: Home / Self Care | Payer: Medicare Other | Source: Ambulatory Visit | Attending: Specialist | Admitting: Specialist

## 2014-06-16 ENCOUNTER — Other Ambulatory Visit: Payer: Medicare Other

## 2014-06-16 NOTE — Discharge Instructions (Signed)

## 2014-06-17 ENCOUNTER — Other Ambulatory Visit: Payer: Self-pay | Admitting: Specialist

## 2014-06-17 DIAGNOSIS — M48061 Spinal stenosis, lumbar region without neurogenic claudication: Secondary | ICD-10-CM

## 2014-06-17 NOTE — Discharge Instructions (Signed)

## 2014-06-18 ENCOUNTER — Ambulatory Visit
Admission: RE | Admit: 2014-06-18 | Discharge: 2014-06-18 | Disposition: A | Discharge status: Home / Self Care | Payer: Medicare Other | Source: Ambulatory Visit | Attending: Specialist | Admitting: Specialist

## 2014-06-18 ENCOUNTER — Other Ambulatory Visit: Payer: Medicare Other

## 2014-06-18 ENCOUNTER — Ambulatory Visit
Admission: RE | Admit: 2014-06-18 | Discharge: 2014-06-18 | Disposition: A | Discharge status: Home / Self Care | Payer: Medicare (Managed Care) | Source: Ambulatory Visit | Attending: Specialist | Admitting: Specialist

## 2014-06-18 DIAGNOSIS — M48061 Spinal stenosis, lumbar region without neurogenic claudication: Secondary | ICD-10-CM

## 2014-06-18 MED ORDER — IOHEXOL 180 MG/ML  SOLN
15.0000 mL | Freq: Once | INTRAMUSCULAR | Status: AC | PRN
  Administered 2014-06-18: 15 mL via INTRATHECAL

## 2014-06-18 MED ORDER — DIAZEPAM 5 MG PO TABS
5.0000 mg | ORAL_TABLET | Freq: Once | ORAL | Status: AC
  Administered 2014-06-18: 5 mg via ORAL

## 2014-06-28 ENCOUNTER — Encounter: Payer: Self-pay | Admitting: Cardiology

## 2014-06-28 ENCOUNTER — Ambulatory Visit (INDEPENDENT_AMBULATORY_CARE_PROVIDER_SITE_OTHER): Payer: Medicare Other | Admitting: Cardiology

## 2014-06-28 VITALS — BP 160/72 | HR 70 | Ht 74.0 in | Wt 221.0 lb

## 2014-06-28 DIAGNOSIS — M79604 Pain in right leg: Secondary | ICD-10-CM | POA: Insufficient documentation

## 2014-06-28 DIAGNOSIS — Z01810 Encounter for preprocedural cardiovascular examination: Secondary | ICD-10-CM

## 2014-06-28 DIAGNOSIS — E785 Hyperlipidemia, unspecified: Secondary | ICD-10-CM

## 2014-06-28 DIAGNOSIS — I25810 Atherosclerosis of coronary artery bypass graft(s) without angina pectoris: Secondary | ICD-10-CM

## 2014-06-28 DIAGNOSIS — I255 Ischemic cardiomyopathy: Secondary | ICD-10-CM

## 2014-06-28 DIAGNOSIS — E1169 Type 2 diabetes mellitus with other specified complication: Secondary | ICD-10-CM

## 2014-06-28 DIAGNOSIS — M79605 Pain in left leg: Secondary | ICD-10-CM

## 2014-06-28 NOTE — Progress Notes (Signed)
PCP: HASANAJ,XAJE A, MD  Clinic Note: Chief Complaint  Patient presents with  . Follow-up    clearance for surgical procedure    HPI: [name] is a 74 y.o. male with a PMH below who presents today for 6 month follow-up of MV CAD.. 2012: Pre-op Evaluation for Knee Sgx - MV CAD --> CABG 05/2010 High Risk ST 2 yr later --> occluded vein graft to the RCA with native RCA occlusion. This is a conjunction with an occluded vein graft to the OM. He has severe disease in the vein graft to the ramus intermedius. He then had PCI to the ramus intermedius graft as well as the native circumflex-OM.  He was seen in April 2016 for Pre-op Risk Stratification for Back Surgery / Procedure. Now is here for re-assessment prior to Epidural +/- decompression.  Past Medical History  Diagnosis Date  . Diabetes type 2, controlled   . HTN (hypertension)   . Dyslipidemia   . DJD (degenerative joint disease) 9/12    Rt TKR  . Iron deficiency anemia   . Osteoarthritis   . Cardiomyopathy, ischemic- moderate LVD (EF of roughly 45%) at cath 2/143 11/03/2012  . 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 S/P CABG x 5     LIMA-LAD, SVG-ramus, SVG-RCA, SVG-OM1-OM 2,  . CAD (coronary artery disease) of bypass graft February 2014    Cath  for abnormal Myoview - LIMA-LAD patent with ~80-90% distal LAD (small); Native Cx ~95-99% just prior to OM1-2, RI - large lateral branch 100%, RCA 100% mid; LIMA-LAD patent, SVG-RCA flush 100%, SVG-OM1-2 flush 100%, SVG- RI anastomotic 90% stenosis; See stent placement below 04/08/2012   . S/P drug eluting coronary stent placement February 2014    S/P elective  PTCA / DES to SVG-RI and OM1 04/08/12  . Abnormal nuclear stress test February 2014    high-risk, referred for redo cath -- noted above    Prior Cardiac Evaluation and Past  Surgical History: Past Surgical History  Procedure Laterality Date  . Coronary artery bypass graft  4/12    x 5 (LIMA-LAD, SVG- RI,. SVG-OM1-OM2, SVG-RCA)  . Total knee arthroplasty  9/12    Rt  . Umbilical hernia repair    . Percutaneous coronary stent intervention (pci-s)  04/08/12    Promus Premier DES 2.25 mm x 12 mm - Mid Circumflex-OM1; Promus Premier DES 2.5 mm x 16 mm -- anastomotic SVG-RI  . Cardiac catheterization  03/2010    revealed multivessel coronary disease.  . Cardiac catheterization  03/2012    which revealed occluded vein graft to the right with a natice RCA occlusion as well, an occluded vein graft to the obtuse marginal with severe insertional disease of the vein graft to the ramus intermedius, but a patent LAD with nonspecific LAD disease  . Hernia mesh removal    . Transthoracic echocardiogram  July 2012    EF 55% with inferior hypokinesis, mild right ventricle dilation but no significant valvular lesions, just aortic sclerosis.   Marland Kitchen Nm myoview ltd  February 2014    pre-op stress test showed some ischemia then he had  a routine follow up stress test for some dyspnea and fatigue (February 2014) that showed ischemia.  . Left heart catheterization with coronary angiogram N/A 04/02/2012    Procedure: LEFT HEART CATHETERIZATION  WITH CORONARY ANGIOGRAM;  Surgeon: Leonie Man, MD;  Location: [facility] CATH LAB;  Service: Cardiovascular;  Laterality: N/A;  . Percutaneous coronary stent intervention (pci-s) N/A 04/08/2012    Procedure: PERCUTANEOUS CORONARY STENT INTERVENTION (PCI-S);  Surgeon: Leonie Man, MD;  Location: [facility] CATH LAB;  Service: Cardiovascular;  Laterality: N/A;    Interval History: Most pressing issue today is related to low back pain & radiculopathy. He had his initial myelogram and now returns for reassessment prior to possible epidural versus lumbar decompression procedure.  He is somewhat limited by his radiculopathy type pain which usually bothers him  mostly first stands up and starts moving. Once he gets into his exercise routine, he actually feels better. He still works out @ [employer] ~3 d/week - [employer], [employer] education [employer] -- no chest pain or shortness of breath with rest or exertion.  Cardiovascular ROS: no chest pain or dyspnea on exertion negative for - edema, irregular heartbeat, loss of consciousness, murmur, orthopnea, palpitations, paroxysmal nocturnal dyspnea, rapid heart rate, shortness of breath and syncope / near syncope, TIA / amaurosis fugax No claudication.  ROS: A comprehensive was performed. Review of Systems  Constitutional: Negative for malaise/fatigue.  HENT: Negative for congestion.   Eyes: Negative.   Respiratory: Negative.   Cardiovascular: Positive for leg swelling (mild @ end of day). Negative for claudication.  Gastrointestinal: Negative for heartburn, blood in stool and melena.  Genitourinary: Negative for hematuria.  Musculoskeletal:       Bilateral leg pain - initially after starting to walk.  Neurological: Negative for dizziness.  Endo/Heme/Allergies: Bruises/bleeds easily (just a little).  All other systems reviewed and are negative.   Current Outpatient Prescriptions on File Prior to Visit  Medication Sig Dispense Refill  . carvedilol (COREG) 25 MG tablet Take 1 tablet (25 mg total) by mouth 2 (two) times daily. 180 tablet 3  . carvedilol (COREG) 25 MG tablet TAKE 1 TABLET (25 MG TOTAL) BY MOUTH 2 (TWO) TIMES DAILY. 180 tablet 3  . fish oil-omega-3 fatty acids 1000 MG capsule Take 1 g by mouth daily.     . hydrochlorothiazide (HYDRODIURIL) 25 MG tablet Take 1 tablet (25 mg total) by mouth daily. 90 tablet 3  . losartan (COZAAR) 100 MG tablet Take 100 mg by mouth daily.    . metFORMIN (GLUCOPHAGE) 500 MG tablet Take 1 tablet (500 mg total) by mouth 2 (two) times daily with a meal.    . nitroGLYCERIN (NITROSTAT) 0.4 MG SL tablet Place 1 tablet (0.4 mg total) under the tongue every 5 (five) minutes as  needed for chest pain. 25 tablet 2  . simvastatin (ZOCOR) 40 MG tablet Take 1 tablet (40 mg total) by mouth every evening. 90 tablet 3  . simvastatin (ZOCOR) 40 MG tablet TAKE 1 TABLET (40 MG TOTAL) BY MOUTH EVERY EVENING. 90 tablet 0  . ticagrelor (BRILINTA) 90 MG TABS tablet Take 1 tablet (90 mg total) by mouth 2 (two) times daily. 180 tablet 3   No current facility-administered medications on file prior to visit.   No Known Allergies   History  Substance Use Topics  . Smoking status: Never Smoker   . Smokeless tobacco: Not on file  . Alcohol Use: No   Family History  Problem Relation Age of Onset  . Cancer Mother     Wt Readings from Last 3 Encounters:  06/28/14 100.245 kg (221 lb)  05/31/14 101.969 kg (224 lb 12.8 oz)  11/30/13 101.152 kg (223 lb)  PHYSICAL EXAM BP 160/72 mmHg  Pulse 70  Ht 6\' 2"  (1.88 m)  Wt 100.245 kg (221 lb)  BMI 28.36 kg/m2 General appearance: alert, cooperative, no distress, mildly obese and BMI would not suggest obesity but simply overweight. Well-nourished. Pleasant mood and affect. Otherwise healthy-appearing.  Neck: no adenopathy, no carotid bruit, no JVD and supple, symmetrical, trachea midline  Lungs: CTAB, normal percussion bilaterally and Nonlabored, good air movement  Heart: RRR, S1& S2 normal, no murmur, click, rub or gallop and normal apical impulse  Abdomen: soft, non-tender; bowel sounds normal; no masses, no organomegaly  Extremities: extremities normal, atraumatic, no cyanosis or edema  Pulses: 2+ and symmetric  Neurologic: Alert and oriented X 3, normal strength and tone. Normal symmetric reflexes. Normal coordination and gait   Adult ECG Report -- NA   Recent Labs:  N/A PCP checked in April  ASSESSMENT / PLAN: Essentially stable from last visit. No notable changes. Remains at low risk for low risk procedures.  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 Status post PCI to SVG-RI (Chronic)    Asymptomatic with routine exercise. On beta blocker and ARB on HCTZ. Continue beta blocker perioperatively is imperative. Also on statin      Cardiomyopathy, ischemic- moderate LVD (EF of roughly 45%) at cath 2/143 (Chronic)    Class I heart failure symptoms at worst. Totally asymptomatic with routine exercise. On stable regimen of beta blocker and ARB. Does not have standing loop diuretic, but is on HCTZ.       Dyslipidemia associated with type 2 diabetes mellitus (Chronic)    On Simvastatin - monitored by PCP Goal LDL < 70      Leg pain, bilateral - Primary    Probably related to back pain / radiculopathy  Orthopedics Surgeon (Dr. Delrae Sawyers) was concerned for PAD. Will check LEA-Dopplers      Relevant Orders   LE ARTERIAL DUPLEX   Pre-operative cardiovascular examination    PREOPERATIVE CARDIAC RISK ASSESSMENT   Revised Cardiac Risk Index:  High Risk Surgery: no; back Sgx  Defined as Intraperitoneal, intrathoracic or suprainguinal vascular  Active CAD: no; no Active Sx.  Revascularized in 03/1012  CHF: no; EF by LV Gram in 2014 ~45%, but no active HF Sx.  Cerebrovascular Disease: no;   Diabetes: yes; On Insulin: no  CKD (Cr >~ 2): no;   Total: 0 Estimated Risk of Adverse Outcome: LOW RISK  Estimated Risk of MI, PE, VF/VT (Cardiac Arrest), Complete Heart Block: < 1 %   ACC/AHA Guidelines for "Clearance":  Step 1 - Need for Emergency Surgery: No:   If Yes - go straight to OR with perioperative surveillance  Step 2 - Active Cardiac Conditions (Unstable Angina, Decompensated HF, Significant  Arrhytmias - Complete HB, Mobitz II, Symptomatic VT or SVT, Severe Aortic Stenosis - mean gradient > 40 mmHg, Valve area < 1.0 cm2):   No:   If Yes - Evaluate & Treat per ACC/AHA Guidelines  Step 3 -  Low Risk Surgery: Yes  If Yes --> proceed to OR   The patient is a low risk patient for low risk  surgery. No additional cardiac evaluation is necessary. If the plan is to go for decompression, we may want to check an echocardiogram just to get a better estimate of his current EF. Otherwise this would not prevent going for procedures or operations.  Okay to stop Brilinta 5  to 7 days preprocedure. Restart when safe postop.         No orders of the defined types were placed in this encounter.   Medication refills No orders of the defined types were placed in this encounter.     Followup: 65months    Ginnette Gates, Leonie Green, M.D., M.S. Interventional Cardiologist   Pager # [phone]

## 2014-06-28 NOTE — Patient Instructions (Addendum)
Your physician has requested that you have a lower extremity arterial exercise duplex. During this test, exercise and ultrasound are used to evaluate arterial blood flow in the legs. Allow one hour for this exam. There are no restrictions or special instructions.  WILL SEND INFORMATION TO DR Tonita Cong   Your physician wants you to follow-up in OCT 2016 KEEP SCHEDULE TIME--DR HARDING.  You will receive a reminder letter in the mail two months in advance. If you don't receive a letter, please call our office to schedule the follow-up appointment.

## 2014-06-28 NOTE — Assessment & Plan Note (Signed)
Probably related to back pain / radiculopathy  Orthopedics Surgeon (Dr. Delrae Sawyers) was concerned for PAD. Will check LEA-Dopplers

## 2014-06-29 ENCOUNTER — Encounter: Payer: Self-pay | Admitting: Cardiology

## 2014-06-29 NOTE — Assessment & Plan Note (Signed)
Class I heart failure symptoms at worst. Totally asymptomatic with routine exercise. On stable regimen of beta blocker and ARB. Does not have standing loop diuretic, but is on HCTZ.

## 2014-06-29 NOTE — Assessment & Plan Note (Signed)
On Simvastatin - monitored by PCP Goal LDL < 70

## 2014-06-29 NOTE — Assessment & Plan Note (Signed)
Asymptomatic with routine exercise. On beta blocker and ARB on HCTZ. Continue beta blocker perioperatively is imperative. Also on statin

## 2014-06-29 NOTE — Assessment & Plan Note (Signed)
PREOPERATIVE CARDIAC RISK ASSESSMENT   Revised Cardiac Risk Index:  High Risk Surgery: no; back Sgx  Defined as Intraperitoneal, intrathoracic or suprainguinal vascular  Active CAD: no; no Active Sx.  Revascularized in 03/1012  CHF: no; EF by LV Gram in 2014 ~45%, but no active HF Sx.  Cerebrovascular Disease: no;   Diabetes: yes; On Insulin: no  CKD (Cr >~ 2): no;   Total: 0 Estimated Risk of Adverse Outcome: LOW RISK  Estimated Risk of MI, PE, VF/VT (Cardiac Arrest), Complete Heart Block: < 1 %   ACC/AHA Guidelines for "Clearance":  Step 1 - Need for Emergency Surgery: No:   If Yes - go straight to OR with perioperative surveillance  Step 2 - Active Cardiac Conditions (Unstable Angina, Decompensated HF, Significant  Arrhytmias - Complete HB, Mobitz II, Symptomatic VT or SVT, Severe Aortic Stenosis - mean gradient > 40 mmHg, Valve area < 1.0 cm2):   No:   If Yes - Evaluate & Treat per ACC/AHA Guidelines  Step 3 -  Low Risk Surgery: Yes  If Yes --> proceed to OR   The patient is a low risk patient for low risk surgery. No additional cardiac evaluation is necessary. If the plan is to go for decompression, we may want to check an echocardiogram just to get a better estimate of his current EF. Otherwise this would not prevent going for procedures or operations.  Okay to stop Brilinta 5 to 7 days preprocedure. Restart when safe postop.

## 2014-06-30 ENCOUNTER — Ambulatory Visit (HOSPITAL_COMMUNITY)
Admission: RE | Admit: 2014-06-30 | Discharge: 2014-06-30 | Disposition: A | Discharge status: Home / Self Care | Payer: Medicare Other | Source: Ambulatory Visit | Attending: Cardiology | Admitting: Cardiology

## 2014-06-30 DIAGNOSIS — M79605 Pain in left leg: Secondary | ICD-10-CM | POA: Diagnosis not present

## 2014-06-30 DIAGNOSIS — I739 Peripheral vascular disease, unspecified: Secondary | ICD-10-CM | POA: Diagnosis not present

## 2014-06-30 DIAGNOSIS — M79604 Pain in right leg: Secondary | ICD-10-CM

## 2014-07-01 ENCOUNTER — Telehealth: Payer: Self-pay | Admitting: *Deleted

## 2014-07-01 NOTE — Telephone Encounter (Signed)
Faxed - 2 cardiac clearance  FORMS  1) ESI-LUMBAR 2) LUIMBAR DECOMPRESSION PER DR HARDING , HOLD BRILINTA 5 DAYS PRE-PROCEDURE AND RESTART 2-3 DAYS POST PROCEDURE FOR BOTH PROCEDURES.

## 2014-07-08 DIAGNOSIS — H2513 Age-related nuclear cataract, bilateral: Secondary | ICD-10-CM | POA: Diagnosis not present

## 2014-07-08 DIAGNOSIS — E119 Type 2 diabetes mellitus without complications: Secondary | ICD-10-CM | POA: Diagnosis not present

## 2014-07-08 DIAGNOSIS — H35033 Hypertensive retinopathy, bilateral: Secondary | ICD-10-CM | POA: Diagnosis not present

## 2014-07-08 DIAGNOSIS — H40013 Open angle with borderline findings, low risk, bilateral: Secondary | ICD-10-CM | POA: Diagnosis not present

## 2014-07-13 ENCOUNTER — Telehealth: Payer: Self-pay | Admitting: Cardiology

## 2014-07-13 NOTE — Telephone Encounter (Signed)
Patient notified of LEA results. Result message was routed to K. Donne Hazel, RN to assist in setting up PV consult  Patient should see Dr. Gwenlyn Found after back surgery - which is scheduled for 6/15

## 2014-07-13 NOTE — Telephone Encounter (Signed)
Returning call about test results , Please call .Marland Kitchen Thanks

## 2014-07-27 ENCOUNTER — Other Ambulatory Visit: Payer: Self-pay | Admitting: Cardiology

## 2014-07-27 NOTE — Telephone Encounter (Signed)
Rx(s) sent to pharmacy electronically.  

## 2014-08-24 ENCOUNTER — Encounter: Payer: Self-pay | Admitting: Cardiovascular Disease

## 2014-08-24 ENCOUNTER — Ambulatory Visit (INDEPENDENT_AMBULATORY_CARE_PROVIDER_SITE_OTHER): Payer: Medicare Other | Admitting: Cardiovascular Disease

## 2014-08-24 VITALS — BP 158/86 | HR 64 | Ht 74.0 in | Wt 216.0 lb

## 2014-08-24 DIAGNOSIS — I739 Peripheral vascular disease, unspecified: Secondary | ICD-10-CM

## 2014-08-24 NOTE — Patient Instructions (Signed)
Dr Gwenlyn Found will follow-up with you as needed.

## 2014-08-24 NOTE — Assessment & Plan Note (Signed)
Mr Gheen was referred by Dr. Ellyn Hack for peripheral vascular evaluation because of abnormal Dopplers. He has risk factors including diabetes, hypertension and hyperlipidemia. He does have ischemic heart disease and has had coronary artery bypass grafting in the past as well as stenting. He denies claudication but does have back issues. Lower extremity arterial Doppler studies performed 06/30/14 revealed a right ABI 0.48 with an occluded right SFA as well as at the occluded right dorsalis pedis, left ABI 0.58 with high-grade left SFA stenosis and occluded left dorsalis pedis. When questioned about claudication the patient really denies this. At this point, I do not see an indication for intervention. Talked about the importance of foot care especially in a diabetic with PAD. We'll continue to follow his Dopplers on annual basis. I will see him back when necessary.

## 2014-08-24 NOTE — Progress Notes (Signed)
08/24/2014 [name]   03/01/1940  [id number]  Primary Physician Neale Burly, MD Primary Cardiologist: Lorretta Harp MD Renae Gloss   HPI:  Mr. [name] is a 74 year old mildly overweight married African-American male patient of Dr. Shanon Brow Harding's referred for evaluation of peripheral vascular disease. His risk factors include treated hypertension, diabetes and hyperlipidemia. He does have coronary artery disease and ischemic cardiopathy with an EF of 45%. He is status post coronary artery bypass grafting April 2012 with subsequent percutaneous intervention. He had lower extremity until Doppler studies performed 06/30/14 revealing a right ABI 0.48 and a left of 0.58. He had an occluded right SFA and high-grade left SFA stenosis. He really denies claudication.   Current Outpatient Prescriptions  Medication Sig Dispense Refill  . carvedilol (COREG) 25 MG tablet Take 1 tablet (25 mg total) by mouth 2 (two) times daily. 180 tablet 3  . fish oil-omega-3 fatty acids 1000 MG capsule Take 1 g by mouth daily.     . hydrochlorothiazide (HYDRODIURIL) 25 MG tablet Take 1 tablet (25 mg total) by mouth daily. 90 tablet 3  . losartan (COZAAR) 100 MG tablet Take 100 mg by mouth daily.    . metFORMIN (GLUCOPHAGE) 500 MG tablet Take 1 tablet (500 mg total) by mouth 2 (two) times daily with a meal.    . nitroGLYCERIN (NITROSTAT) 0.4 MG SL tablet Place 1 tablet (0.4 mg total) under the tongue every 5 (five) minutes as needed for chest pain. 25 tablet 2  . simvastatin (ZOCOR) 40 MG tablet Take 1 tablet (40 mg total) by mouth every evening. 90 tablet 3  . ticagrelor (BRILINTA) 90 MG TABS tablet Take 1 tablet (90 mg total) by mouth 2 (two) times daily. 180 tablet 3   No current facility-administered medications for this visit.    No Known Allergies  History   Social History  . Marital Status: Married    Spouse Name: N/A  . Number of Children: N/A  . Years of Education: N/A    Occupational History  . Not on file.   Social History Main Topics  . Smoking status: Never Smoker   . Smokeless tobacco: Not on file  . Alcohol Use: No  . Drug Use: No  . Sexual Activity: Not on file   Other Topics Concern  . Not on file   Social History Narrative   He is a married father of 2. He exercises routinely at the [facility]Marland Kitchen    He quit smoking about 30 years ago and has social alcohol.      Review of Systems: General: negative for chills, fever, night sweats or weight changes.  Cardiovascular: negative for chest pain, dyspnea on exertion, edema, orthopnea, palpitations, paroxysmal nocturnal dyspnea or shortness of breath Dermatological: negative for rash Respiratory: negative for cough or wheezing Urologic: negative for hematuria Abdominal: negative for nausea, vomiting, diarrhea, bright red blood per rectum, melena, or hematemesis Neurologic: negative for visual changes, syncope, or dizziness All other systems reviewed and are otherwise negative except as noted above.    Blood pressure 158/86, pulse 64, height 6\' 2"  (1.88 m), weight 216 lb (97.977 kg).  General appearance: alert and no distress Neck: no adenopathy, no JVD, supple, symmetrical, trachea midline, thyroid not enlarged, symmetric, no tenderness/mass/nodules and soft bilateral carotid bruits Lungs: clear to auscultation bilaterally Heart: regular rate and rhythm, S1, S2 normal, no murmur, click, rub or gallop Extremities: extremities normal, atraumatic, no cyanosis or edema and absent pedal pulses  EKG not performed today  ASSESSMENT AND PLAN:   Peripheral arterial disease Mr Fuster was referred by Dr. Ellyn Hack for peripheral vascular evaluation because of abnormal Dopplers. He has risk factors including diabetes, hypertension and hyperlipidemia. He does have ischemic heart disease and has had coronary artery bypass grafting in the past as well as stenting. He denies claudication but does have back  issues. Lower extremity arterial Doppler studies performed 06/30/14 revealed a right ABI 0.48 with an occluded right SFA as well as at the occluded right dorsalis pedis, left ABI 0.58 with high-grade left SFA stenosis and occluded left dorsalis pedis. When questioned about claudication the patient really denies this. At this point, I do not see an indication for intervention. Talked about the importance of foot care especially in a diabetic with PAD. We'll continue to follow his Dopplers on annual basis. I will see him back when necessary.      Lorretta Harp MD FACP,FACC,FAHA, [facility] 08/24/2014 11:44 AM

## 2014-08-25 ENCOUNTER — Other Ambulatory Visit: Payer: Self-pay | Admitting: Cardiology

## 2014-08-25 NOTE — Telephone Encounter (Signed)
REFILL 

## 2014-09-16 DIAGNOSIS — Z86010 Personal history of colonic polyps: Secondary | ICD-10-CM | POA: Diagnosis not present

## 2014-10-12 ENCOUNTER — Telehealth: Payer: Self-pay | Admitting: Cardiovascular Disease

## 2014-10-12 NOTE — Telephone Encounter (Signed)
Talked to wife and informed her of last OV with Dr. Gwenlyn Found.  Informed her that Dr. Gwenlyn Found did not feel an intervention was necessary at this point and recommended diabetic foot care along with yearly doppler studies

## 2014-10-12 NOTE — Telephone Encounter (Signed)
Pt's wife called in wanting to know when Dr.Berry was wanting to schedule his surgery so that he could have stents placed. Please call  Thanks

## 2014-10-14 NOTE — Telephone Encounter (Signed)
Can this encounter be closed?

## 2014-11-29 ENCOUNTER — Encounter: Payer: Self-pay | Admitting: Cardiology

## 2014-11-29 ENCOUNTER — Ambulatory Visit (INDEPENDENT_AMBULATORY_CARE_PROVIDER_SITE_OTHER): Payer: Medicare Other | Admitting: Cardiology

## 2014-11-29 VITALS — BP 140/72 | HR 63 | Ht 74.0 in | Wt 218.0 lb

## 2014-11-29 DIAGNOSIS — E1169 Type 2 diabetes mellitus with other specified complication: Secondary | ICD-10-CM

## 2014-11-29 DIAGNOSIS — E669 Obesity, unspecified: Secondary | ICD-10-CM

## 2014-11-29 DIAGNOSIS — I2510 Atherosclerotic heart disease of native coronary artery without angina pectoris: Secondary | ICD-10-CM | POA: Diagnosis not present

## 2014-11-29 DIAGNOSIS — M79604 Pain in right leg: Secondary | ICD-10-CM

## 2014-11-29 DIAGNOSIS — Z955 Presence of coronary angioplasty implant and graft: Secondary | ICD-10-CM

## 2014-11-29 DIAGNOSIS — I255 Ischemic cardiomyopathy: Secondary | ICD-10-CM

## 2014-11-29 DIAGNOSIS — E118 Type 2 diabetes mellitus with unspecified complications: Secondary | ICD-10-CM

## 2014-11-29 DIAGNOSIS — Z95828 Presence of other vascular implants and grafts: Secondary | ICD-10-CM

## 2014-11-29 DIAGNOSIS — I10 Essential (primary) hypertension: Secondary | ICD-10-CM | POA: Diagnosis not present

## 2014-11-29 DIAGNOSIS — I739 Peripheral vascular disease, unspecified: Secondary | ICD-10-CM

## 2014-11-29 DIAGNOSIS — M79605 Pain in left leg: Secondary | ICD-10-CM

## 2014-11-29 DIAGNOSIS — I25810 Atherosclerosis of coronary artery bypass graft(s) without angina pectoris: Secondary | ICD-10-CM

## 2014-11-29 DIAGNOSIS — E785 Hyperlipidemia, unspecified: Secondary | ICD-10-CM

## 2014-11-29 MED ORDER — TELMISARTAN 80 MG PO TABS: 80.0000 mg | ORAL_TABLET | Freq: Every day | ORAL | Status: DC

## 2014-11-29 NOTE — Progress Notes (Signed)
PCP: Neale Burly, MD  Clinic Note: Chief Complaint  Patient presents with  . 6 MONTH FOLLOW UP  . Coronary Artery Disease  . PAD  . Hypertension    HPI: [name] is a 75 y.o. male with a PMH below who presents today for 6 month f/u for CAD-CABG & PCI.Marland Kitchen  [name] was last seen in May 2016 -- referred to Dr. Gwenlyn Found for Claudication / PAD. Per Dr. Gwenlyn Found: Denies claudication --> Lower extremity arterial Doppler studies performed 06/30/14 revealed a right ABI 0.48 with an occluded right SFA as well as at the occluded right dorsalis pedis, left ABI 0.58 with high-grade left SFA stenosis and occluded left dorsalis pedis. When questioned about claudication the patient really denies this. At this point, I do not see an indication for intervention. Talked about the importance of foot care especially in a diabetic with PAD. We'll continue to follow his Dopplers on annual basis.  Recent Hospitalizations: none  Studies Reviewed:   LEA Dopplers with ABIs - reviewed above -- L. ABI 0.58 with high grade L. SFA stenosis and occluded L. DP. R. ABI 0.48 with occluded R. SFA and occluded R. DP.  Interval History: [name] continues to be stable from a cardiac standpoint. He still works out at [employer] 3-4 days a week for about half an hour a time. He is somewhat limited by back pain with some radiculopathy. Does not really know much in the way of limiting claudication symptoms, and therefore the plan is medical management of his PAD. From a cardiac standpoint:   No chest pain or shortness of breath with rest or exertion.   No PND, orthopnea or edema.   No palpitations, lightheadedness, dizziness, weakness or syncope/near syncope.  No TIA/amaurosis fugax symptoms.   ROS: A comprehensive was performed. Review of Systems  Constitutional: Negative for malaise/fatigue.  Eyes: Negative for blurred vision.  Respiratory: Negative for cough.   Cardiovascular:       Per history of present  illness  Gastrointestinal: Negative for blood in stool and melena.  Genitourinary: Negative for hematuria.  Musculoskeletal: Positive for back pain. Negative for falls.  Neurological: Positive for tingling (Associated with back pain). Negative for headaches.  Endo/Heme/Allergies: Does not bruise/bleed easily.  Psychiatric/Behavioral: Negative.   All other systems reviewed and are negative.   Past Medical History  Diagnosis Date  . Diabetes type 2, controlled ([place])   . HTN (hypertension)   . Dyslipidemia   . DJD (degenerative joint disease) 9/12    Rt TKR  . Iron deficiency anemia   . Osteoarthritis   . Cardiomyopathy, ischemic- moderate LVD (EF of roughly 45%) at cath 2/143 11/03/2012  . 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 S/P CABG x 5     LIMA-LAD, SVG-ramus, SVG-RCA, SVG-OM1-OM 2,  . CAD (coronary artery disease) of bypass graft February 2014    Cath  for abnormal Myoview - LIMA-LAD patent with ~80-90% distal LAD (small); Native Cx ~95-99% just prior to OM1-2, RI - large lateral branch 100%, RCA 100% mid; LIMA-LAD patent, SVG-RCA flush 100%, SVG-OM1-2 flush 100%, SVG- RI anastomotic 90% stenosis; See stent placement below 04/08/2012   . S/P drug eluting coronary stent placement February 2014    S/P elective  PTCA / DES to SVG-RI and OM1 04/08/12  . Abnormal  nuclear stress test February 2014    high-risk, referred for redo cath -- noted above  . Peripheral arterial disease ([place]) May 2016    L. ABI 0.58 with high grade L. SFA stenosis and occluded L. DP. R. ABI 0.48 with occluded R. SFA and occluded R. DP.    Past Surgical History  Procedure Laterality Date  . Cardiac catheterization  03/2010    revealed multivessel coronary disease.  . Coronary artery bypass graft      x 5 (LIMA-LAD, SVG- RI,. SVG-OM1-OM2, SVG-RCA)  .  Transthoracic echocardiogram  July 2012    EF 55% with inferior hypokinesis, mild right ventricle dilation but no significant valvular lesions, just aortic sclerosis.   Marland Kitchen Nm myoview ltd  February 2014    pre-op stress test showed some ischemia then he had  a routine follow up stress test for some dyspnea and fatigue (February 2014) that showed ischemia.  . Left heart catheterization with coronary angiogram N/A 04/02/2012    Procedure: LEFT HEART CATHETERIZATION WITH CORONARY ANGIOGRAM;  Surgeon: Leonie Man, MD;  Location: [facility] CATH LAB;  Service: Cardiovascular;--> Occluded SVG-rPDA & native RCA, 100% SVG-OM & Severe anastomotic lesion in SVG-RI.  Patent LIMA-LAD.  Marland Kitchen Percutaneous coronary stent intervention (pci-s) N/A 04/08/2012    Procedure: PERCUTANEOUS CORONARY STENT INTERVENTION (PCI-S);  Surgeon: Leonie Man, MD;  Location: [facility] CATH LAB;  Service: Cardiovascular; Promus Premier DES 2.25 mm x 12 mm - Mid Circumflex-OM1; Promus Premier DES 2.5 mm x 16 mm -- anastomotic SVG-RI  . Total knee arthroplasty Right 9/21012  . Umbilical hernia repair    . Hernia mesh removal     Prior to Admission medications   Medication Sig Start Date End Date Taking? Authorizing Provider  carvedilol (COREG) 25 MG tablet Take 1 tablet (25 mg total) by mouth 2 (two) times daily. 05/31/14  Yes Leonie Man, MD  fish oil-omega-3 fatty acids 1000 MG capsule Take 1 g by mouth daily.    Yes Historical Provider, MD  hydrochlorothiazide (HYDRODIURIL) 25 MG tablet Take 1 tablet (25 mg total) by mouth daily. 05/31/14  Yes Leonie Man, MD  losartan (COZAAR) 100 MG tablet Take 100 mg by mouth daily.   Yes Historical Provider, MD  metFORMIN (GLUCOPHAGE) 500 MG tablet Take 1 tablet (500 mg total) by mouth 2 (two) times daily with a meal. 04/11/12  Yes Erlene Quan, PA-C  nitroGLYCERIN (NITROSTAT) 0.4 MG SL tablet Place 1 tablet (0.4 mg total) under the tongue every 5 (five) minutes as needed for chest pain. 04/09/12  Yes  Luke K Kilroy, PA-C  simvastatin (ZOCOR) 40 MG tablet TAKE 1 TABLET (40 MG TOTAL) BY MOUTH EVERY EVENING. 08/25/14  Yes Leonie Man, MD  ticagrelor (BRILINTA) 90 MG TABS tablet Take 1 tablet (90 mg total) by mouth 2 (two) times daily. 05/31/14  Yes Leonie Man, MD   No Known Allergies   Social History   Social History  . Marital Status: Married    Spouse Name: N/A  . Number of Children: N/A  . Years of Education: N/A   Social History Main Topics  . Smoking status: Never Smoker   . Smokeless tobacco: None  . Alcohol Use: No  . Drug Use: No  . Sexual Activity: Not Asked   Other Topics Concern  . None   Social History Narrative   He is a married father of 2. He exercises routinely at the [facility].Marland Kitchen    He quit smoking  about 30 years ago and has social alcohol.    Family History  Problem Relation Age of Onset  . Cancer Mother     Wt Readings from Last 3 Encounters:  11/29/14 218 lb (98.884 kg)  08/24/14 216 lb (97.977 kg)  06/28/14 221 lb (100.245 kg)    PHYSICAL EXAM BP 140/72 mmHg  Pulse 63  Ht 6\' 2"  (1.88 m)  Wt 218 lb (98.884 kg)  BMI 27.98 kg/m2 General appearance: alert, cooperative, no distress, mildly obese and BMI would not suggest obesity but simply overweight. Well-nourished. Pleasant mood and affect. Otherwise healthy-appearing.  Neck: no adenopathy, no carotid bruit, no JVD and supple, symmetrical, trachea midline  Lungs: CTAB, normal percussion bilaterally and Nonlabored, good air movement  Heart: RRR, S1& S2 normal, no murmur, click, rub or gallop and normal apical impulse  Abdomen: soft, non-tender; bowel sounds normal; no masses, no organomegaly  Extremities: extremities normal, atraumatic, no cyanosis or edema  Pulses: 2+ and symmetric  Neurologic: Alert and oriented X 3, normal strength and tone. Normal symmetric reflexes. Normal coordination and gait    Adult ECG Report  Rate: 63 ;  Rhythm: normal sinus rhythm and Borderline criteria  for LVH. Inferior infarct, age undetermined. Otherwise normal axis and intervals and durations (borderline 1 AV block with PR interval 200 ms);   Narrative Interpretation: stable EKG with no significant changes   Other studies Reviewed: Additional studies/ records that were reviewed today include:  Recent Labs: 09/06/2014  TCD 148, TG 118, HDL 40, LDL 84. Hemoglobin A1c 6.2.    ASSESSMENT / PLAN: Problem List Items Addressed This Visit    Type II diabetes mellitus with complication-- CAD (Chronic)   Relevant Medications   telmisartan (MICARDIS) 80 MG tablet   Presence of Saphenous Vein Bypass Graft stent -- anastomotic SVG-RI lesion, Promus Premier DES 2.5 mm x 16 mm (postdilated to 0.8 in graft) (Chronic)    Vein graft stent at the anastomosis. Remains on Brilinta. Okay to hold for procedures.      Presence of drug coated stent in left circumflex coronary artery; Promus Premier 2.25 mm x 12 mm (Chronic)   Peripheral arterial disease (HCC) (Chronic)    Significant ABIs with two-vessel runoff bilaterally and occluded SFAs / DPAs.  Plan is to continue ambulation and monitor for symptoms. Close care for foot hygiene. Monitor for any sores and careful maintenance of toenails      Relevant Medications   telmisartan (MICARDIS) 80 MG tablet   Obesity (Chronic)    Has done well with weight loss.  Stable below "obesity criteria - still 3 lb down from May.  Doing well with diet & exercise.      Leg pain, bilateral    Symptoms are more related radiculopathy, but he does have significant PAD. Currently does not seem to have active claudication symptoms.      Essential hypertension - Primary (Chronic)    Inadequately controlled today. Has had other episodes of high pressures as well. I will change him losartan to Micardis 80 mg      Relevant Medications   telmisartan (MICARDIS) 80 MG tablet   Other Relevant Orders   EKG 12-Lead   Dyslipidemia associated with type 2 diabetes mellitus  (HCC) (Chronic)    On simvastatin. Not quite at goal LDL less than 70. Would consider adding Zetia - or converting to a more potent statin, but will defer to PCP who should be checking labs and routine followup.  Relevant Medications   telmisartan (MICARDIS) 80 MG tablet   Cardiomyopathy, ischemic- moderate LVD (EF of roughly 45%) at cath 2/143 (Chronic)   Relevant Medications   telmisartan (MICARDIS) 80 MG tablet   CAD, CABG X 18 May 2010, progression at cath 04/02/12 -- status post PCI to SVG-RI & native Cx-OM. (Chronic)    Occluded RCA and vein graft to the RCA with patent LIMA, and now patent/stented graft to ramus intermedius. Circumflex was treated with DES as well. No active anginal symptoms. Would be due for followup stress testing that could be ordered at a one-year followup from now.  Remained stable on Brilinta alone. We discussed the possibility of converting to Plavix. He would prefer to stay on a more potent medication. He is on stable dose of carvedilol, ARB and statin.      Relevant Medications   telmisartan (MICARDIS) 80 MG tablet   Atherosclerotic heart disease of artery bypass graft - occluded SVG-RCA and occluded SVG-OM. Status post PCI to SVG-RI (Chronic)   Relevant Medications   telmisartan (MICARDIS) 80 MG tablet      Current medicines are reviewed at length with the patient today. (+/- concerns) none The following changes have been made:    KEEP WALKING  CHANGE to MICARDIS 80 MG from LOSARTAN 100 MG. TAKE ONE TABLET DAILY.   Your physician wants you to follow-up in [place] - 30 MINS. Studies Ordered:   Orders Placed This Encounter  Procedures  . EKG 12-Lead      Leonie Man, M.D., M.S. Interventional Cardiologist   Pager # [phone]

## 2014-11-29 NOTE — Patient Instructions (Addendum)
KEEP WALKING  CHANGE MICARDIS 80 MG FORM LOSARTAN 100 MG. TAKE ONE TABLET DAILY.   Your physician wants you to follow-up in [place] - 30 MINS.  You will receive a reminder letter in the mail two months in advance. If you don't receive a letter, please call our office to schedule the follow-up appointment.

## 2014-12-01 ENCOUNTER — Encounter: Payer: Self-pay | Admitting: Cardiology

## 2014-12-01 NOTE — Assessment & Plan Note (Signed)
On simvastatin. Not quite at goal LDL less than 70. Would consider adding Zetia - or converting to a more potent statin, but will defer to PCP who should be checking labs and routine followup.

## 2014-12-01 NOTE — Assessment & Plan Note (Signed)
Has done well with weight loss.  Stable below "obesity criteria - still 3 lb down from May.  Doing well with diet & exercise.

## 2014-12-01 NOTE — Assessment & Plan Note (Signed)
Occluded RCA and vein graft to the RCA with patent LIMA, and now patent/stented graft to ramus intermedius. Circumflex was treated with DES as well. No active anginal symptoms. Would be due for followup stress testing that could be ordered at a one-year followup from now.  Remained stable on Brilinta alone. We discussed the possibility of converting to Plavix. He would prefer to stay on a more potent medication. He is on stable dose of carvedilol, ARB and statin.

## 2014-12-01 NOTE — Assessment & Plan Note (Signed)
Inadequately controlled today. Has had other episodes of high pressures as well. I will change him losartan to Micardis 80 mg

## 2014-12-01 NOTE — Assessment & Plan Note (Signed)
Symptoms are more related radiculopathy, but he does have significant PAD. Currently does not seem to have active claudication symptoms.

## 2014-12-01 NOTE — Assessment & Plan Note (Signed)
Vein graft stent at the anastomosis. Remains on Brilinta. Okay to hold for procedures.

## 2014-12-01 NOTE — Assessment & Plan Note (Signed)
Significant ABIs with two-vessel runoff bilaterally and occluded SFAs / DPAs.  Plan is to continue ambulation and monitor for symptoms. Close care for foot hygiene. Monitor for any sores and careful maintenance of toenails

## 2014-12-17 ENCOUNTER — Encounter: Payer: Self-pay | Admitting: Cardiology

## 2015-02-06 ENCOUNTER — Other Ambulatory Visit: Payer: Self-pay | Admitting: Cardiology

## 2015-05-31 ENCOUNTER — Ambulatory Visit (INDEPENDENT_AMBULATORY_CARE_PROVIDER_SITE_OTHER): Payer: Medicare Other | Admitting: Cardiology

## 2015-05-31 VITALS — BP 138/70 | HR 60 | Ht 74.0 in | Wt 227.8 lb

## 2015-05-31 DIAGNOSIS — I255 Ischemic cardiomyopathy: Secondary | ICD-10-CM | POA: Diagnosis not present

## 2015-05-31 DIAGNOSIS — I10 Essential (primary) hypertension: Secondary | ICD-10-CM | POA: Diagnosis not present

## 2015-05-31 DIAGNOSIS — I25708 Atherosclerosis of coronary artery bypass graft(s), unspecified, with other forms of angina pectoris: Secondary | ICD-10-CM

## 2015-05-31 DIAGNOSIS — E1169 Type 2 diabetes mellitus with other specified complication: Secondary | ICD-10-CM

## 2015-05-31 DIAGNOSIS — I2510 Atherosclerotic heart disease of native coronary artery without angina pectoris: Secondary | ICD-10-CM | POA: Diagnosis not present

## 2015-05-31 DIAGNOSIS — E785 Hyperlipidemia, unspecified: Secondary | ICD-10-CM

## 2015-05-31 DIAGNOSIS — E669 Obesity, unspecified: Secondary | ICD-10-CM

## 2015-05-31 DIAGNOSIS — Z9861 Coronary angioplasty status: Secondary | ICD-10-CM

## 2015-05-31 DIAGNOSIS — I739 Peripheral vascular disease, unspecified: Secondary | ICD-10-CM | POA: Diagnosis not present

## 2015-05-31 NOTE — Patient Instructions (Addendum)
No changes with current medications   Your physician wants you to follow-up in 6 months with DR HARDING.-30 MIN  You will receive a reminder letter in the mail two months in advance. If you don't receive a letter, please call our office to schedule the follow-up appointment.  If you need a refill on your cardiac medications before your next appointment, please call your pharmacy.

## 2015-05-31 NOTE — Progress Notes (Signed)
PCP: Neale Burly, MD  Clinic Note: Chief Complaint  Patient presents with  . Follow-up    pt denied chest pain and SOB  . Coronary Artery Disease    HPI: [name] is a 75 y.o. male with a PMH below who presents today for 6 month f/u of CAD-CABG-PCI & PAD. [name] initially underwent cardiac evaluation for preoperative risk gratification. He had abnormal stress test and was found to have multivessel CAD on cath with reduced ejection fraction. He was referred for CABG in April 2012. Roughly 2 years later he had an abnormal stress test and was taken back to the Cath Lab where he was found to have occluded vein graft to the RCA as well as being after OM1 and OM 2. He subsequently underwent PCI of the native circumflex-OM 1 as well as the SVG-Ramus Intermedius. Interestingly, [name] doesn't usually have much the way of any symptoms when he has issues with his coronary disease, he simply does notes some fatigue.  [name] was last seen in Oct 2016. He has also been followed by Dr. Gwenlyn Found for claudication. He does have pretty significant disease, but they're currently treating medically and recommending continued walking.  Recent Hospitalizations: None.  Studies Reviewed: none  Interval History: [name] presents today actually doing pretty well. He doesn't have much the way of any significant symptoms. He does have notable claudication symptoms but nothing acute syndrome doing the routine exercise usually does. He pretty much does daily workouts at the [facility]. He will have to stop because of claudication when walking or doing the NuStep machine, but after a minute or so is able to get red back onto it. In doing these exercises he is not noticing any significant chest tightness or pressure or dyspnea either. It takes a little while to get going, once he gets going he does fine. He denies any significant heart failure symptoms of PND, orthopnea but does have some chronic lower TIMI  edema which is relatively stable. He denies any rapid or irregular heartbeat/palpitations except when exercising he feels is starting to walk. He denies any syncope/near syncope or TIA/amaurosis fugax.  ROS: A comprehensive was performed. Review of Systems  Constitutional: Negative for weight loss (He actually has put on some weight, he got out of the habit of doing exercise for little while, and has not been paying as much attention to his eating.) and malaise/fatigue.  HENT: Negative for nosebleeds.   Eyes: Negative for blurred vision.  Respiratory: Negative for cough, shortness of breath and wheezing.   Cardiovascular: Positive for claudication (Relatively stable). Negative for leg swelling.  Gastrointestinal: Positive for heartburn (Depending on what he eats.). Negative for blood in stool and melena.  Genitourinary: Negative for hematuria.  Musculoskeletal: Positive for back pain and joint pain. Negative for myalgias.  Neurological: Positive for dizziness. Negative for sensory change, speech change, focal weakness, loss of consciousness and headaches.  Endo/Heme/Allergies: Does not bruise/bleed easily.  Psychiatric/Behavioral: Negative for depression, hallucinations and memory loss. The patient is not nervous/anxious.   All other systems reviewed and are negative.   Past Medical History  Diagnosis Date  . Diabetes type 2, controlled ([place])   . HTN (hypertension)   . Dyslipidemia   . DJD (degenerative joint disease) 9/12    Rt TKR  . Iron deficiency anemia   . Osteoarthritis   . Cardiomyopathy, ischemic- moderate LVD (EF of roughly 45%) at cath 2/143 11/03/2012  . CAD, multiple vessel 05/2010; 03/2012  CABG X 5; cath 03/2012 --> LIMA-LAD - patent, SVG-Ramus - anastamotic 80% - DES stent; SVG-RCA & SVG-OM1-OM2-- occluded; DES Stent to Circumflex-OM1    . S/P CABG x 5     LIMA-LAD, SVG-ramus, SVG-RCA, SVG-OM1-OM 2,  . CAD (coronary artery disease) of bypass graft February 2014    Cath   for abnormal Myoview - LIMA-LAD patent with ~80-90% distal LAD (small); Native Cx ~95-99% just prior to OM1-2, RI - large lateral branch 100%, RCA 100% mid; LIMA-LAD patent, SVG-RCA flush 100%, SVG-OM1-2 flush 100%, SVG- RI anastomotic 90% stenosis; See stent placement below 04/08/2012   . S/P drug eluting coronary stent placement February 2014    S/P elective  PTCA / DES to SVG-RI and OM1 04/08/12  . Abnormal nuclear stress test February 2014    high-risk, referred for redo cath -- noted above  . Peripheral arterial disease ([place]) May 2016    L. ABI 0.58 with high grade L. SFA stenosis and occluded L. DP. R. ABI 0.48 with occluded R. SFA and occluded R. DP.    Past Surgical History  Procedure Laterality Date  . Cardiac catheterization  03/2010    revealed multivessel coronary disease.  . Coronary artery bypass graft      x 5 (LIMA-LAD, SVG- RI,. SVG-OM1-OM2, SVG-RCA)  . Transthoracic echocardiogram  July 2012    EF 55% with inferior hypokinesis, mild right ventricle dilation but no significant valvular lesions, just aortic sclerosis.   Marland Kitchen Nm myoview ltd  February 2014    pre-op stress test showed some ischemia then he had  a routine follow up stress test for some dyspnea and fatigue (February 2014) that showed ischemia.  . Left heart catheterization with coronary angiogram N/A 04/02/2012    Procedure: LEFT HEART CATHETERIZATION WITH CORONARY ANGIOGRAM;  Surgeon: Leonie Man, MD;  Location: [facility] CATH LAB;  Service: Cardiovascular;--> Occluded SVG-rPDA & native RCA, 100% SVG-OM & Severe anastomotic lesion in SVG-RI.  Patent LIMA-LAD.  Marland Kitchen Percutaneous coronary stent intervention (pci-s) N/A 04/08/2012    Procedure: PERCUTANEOUS CORONARY STENT INTERVENTION (PCI-S);  Surgeon: Leonie Man, MD;  Location: [facility] CATH LAB;  Service: Cardiovascular; Promus Premier DES 2.25 mm x 12 mm - Mid Circumflex-OM1; Promus Premier DES 2.5 mm x 16 mm -- anastomotic SVG-RI  . Total knee arthroplasty Right 9/21012  .  Umbilical hernia repair    . Hernia mesh removal     Prior to Admission medications   Medication Sig Start Date End Date Taking? Authorizing Provider  carvedilol (COREG) 25 MG tablet Take 1 tablet (25 mg total) by mouth 2 (two) times daily. 05/31/14  Yes Leonie Man, MD  fish oil-omega-3 fatty acids 1000 MG capsule Take 1 g by mouth daily.    Yes Historical Provider, MD  hydrochlorothiazide (HYDRODIURIL) 25 MG tablet Take 1 tablet (25 mg total) by mouth daily. 05/31/14  Yes Leonie Man, MD  metFORMIN (GLUCOPHAGE) 500 MG tablet Take 500 mg by mouth daily with breakfast.   Yes Historical Provider, MD  nitroGLYCERIN (NITROSTAT) 0.4 MG SL tablet Place 1 tablet (0.4 mg total) under the tongue every 5 (five) minutes as needed for chest pain. 04/09/12  Yes Luke K Kilroy, PA-C  simvastatin (ZOCOR) 40 MG tablet TAKE 1 TABLET (40 MG TOTAL) BY MOUTH EVERY EVENING. 08/25/14  Yes Leonie Man, MD  telmisartan (MICARDIS) 80 MG tablet Take 1 tablet (80 mg total) by mouth daily. 11/29/14  Yes Leonie Man, MD  ticagrelor [facility]) 90  MG TABS tablet Take 1 tablet (90 mg total) by mouth 2 (two) times daily. 05/31/14  Yes Leonie Man, MD   No Known Allergies  Social History   Social History  . Marital Status: Married    Spouse Name: N/A  . Number of Children: N/A  . Years of Education: N/A   Social History Main Topics  . Smoking status: Never Smoker   . Smokeless tobacco: None  . Alcohol Use: No  . Drug Use: No  . Sexual Activity: Not Asked   Other Topics Concern  . None   Social History Narrative   He is a married father of 2. He exercises routinely at the [facility].Marland Kitchen    He quit smoking about 30 years ago and has social alcohol.    Family History  Problem Relation Age of Onset  . Cancer Mother     Wt Readings from Last 3 Encounters:  05/31/15 227 lb 12.8 oz (103.329 kg)  11/29/14 218 lb (98.884 kg)  08/24/14 216 lb (97.977 kg)    PHYSICAL EXAM BP 138/70 mmHg  Pulse 60  Ht  6\' 2"  (1.88 m)  Wt 227 lb 12.8 oz (103.329 kg)  BMI 29.24 kg/m2 General appearance: alert, cooperative, no distress, mildly obese and BMI would not suggest obesity but simply overweight. Well-nourished. Pleasant mood and affect. Otherwise healthy-appearing.  HEENT: [place]/AT, EOMI, MMM, anicteric sclera Neck: no adenopathy, no carotid bruit, no JVD and supple, symmetrical, trachea midline  Lungs: CTAB, normal percussion bilaterally and Nonlabored, good air movement  Heart: RRR, S1& S2 normal, no murmur, click, rub or gallop and normal apical impulse  Abdomen: soft, non-tender; bowel sounds normal; no masses, no organomegaly  Extremities: extremities normal, atraumatic, no cyanosis or edema  Pulses: 2+ and symmetric  Neurologic: Alert and oriented X 3, normal strength and tone. Normal symmetric reflexes. Normal coordination and gait   Adult ECG Report  Rate: 60 ;  Rhythm: normal sinus rhythm and 1 AVB block. Old LVH changes. Inferior MI, age undetermined.;   Narrative Interpretation: Stable EKG.   Other studies Reviewed: Additional studies/ records that were reviewed today include:  Recent Labs:   No results found for: CHOL, HDL, LDLCALC, LDLDIRECT, TRIG, CHOLHDL   ASSESSMENT / PLAN: Problem List Items Addressed This Visit    S/P elective  PTCA / DES to SVG-RI and OM1 04/08/12 (Chronic)    He is far enough out now to be on Brilinta alone without aspirin. Okay to hold if necessary for procedure.      Peripheral arterial disease (HCC) (Chronic)    No evidence of critical limb ischemia. No lesions or wounds. No resting claudication. He is on stable cardiac medications which are helpful for PAD. But most importantly, he continues to remain active and exercising a lot for formation of collateral flow.      Relevant Orders   EKG 12-Lead (Completed)   Obesity (Chronic)   Relevant Medications   metFORMIN (GLUCOPHAGE) 500 MG tablet   Essential hypertension (Chronic)     Well-controlled today. Last muscle and we increased his ARB from losartan to Micardis 80 mg in addition to the HCTZ in carvedilol.      Relevant Orders   EKG 12-Lead (Completed)   Dyslipidemia associated with type 2 diabetes mellitus (HCC) (Chronic)    His PCP usually checks his lipids. He is on moderate to high-dose simvastatin plus omega-3 fatty acids. Should be due for follow-up check of labs soon. If he is not at goal,  again I would consider either changing to more potent statin such as atorvastatin or rosuvastatin versus adding Zetia.      Relevant Medications   metFORMIN (GLUCOPHAGE) 500 MG tablet   Other Relevant Orders   EKG 12-Lead (Completed)   Cardiomyopathy, ischemic- moderate LVD (EF of roughly 45%) at cath 2/143 (Chronic)    At the most he has class I heart failure symptoms. On stable dose of ARB and carvedilol. Not requiring any diuretic beyond HCTZ for blood pressure control. No active heart failure symptoms      Relevant Orders   EKG 12-Lead (Completed)   CAD, CABG X 18 May 2010, progression at cath 04/02/12 -- status post PCI to SVG-RI & native Cx-OM. - Primary (Chronic)    Significant native disease with a stent to the circumflex and anastomosis of ramus intermedius. On stable regimen.      Relevant Orders   EKG 12-Lead (Completed)   Atherosclerotic heart disease of artery bypass graft - occluded SVG-RCA and occluded SVG-OM. Status post PCI to SVG-RI (Chronic)    Unbelievably, he really has not had much the way of any significant anginal symptoms despite all of his CAD. Probably related to his diabetes. He has some neuropathy issues, so may very well have reduced sensations. As a result of this I probably would do more frequent screening stress test. Probably next year. He is on a good dose of ARB and beta blocker. Also on statin and Brilinta.         Current medicines are reviewed at length with the patient today. (+/- concerns) None The following changes have  been made: None Studies Ordered:   Orders Placed This Encounter  Procedures  . EKG 12-Lead    Follow-up in 6 months    HARDING, Leonie Green, M.D., M.S. Interventional Cardiologist   Pager # [phone] Phone # [phone] [address] [place], [place] [address]

## 2015-06-02 ENCOUNTER — Encounter: Payer: Self-pay | Admitting: Cardiology

## 2015-06-02 NOTE — Assessment & Plan Note (Signed)
Unbelievably, he really has not had much the way of any significant anginal symptoms despite all of his CAD. Probably related to his diabetes. He has some neuropathy issues, so may very well have reduced sensations. As a result of this I probably would do more frequent screening stress test. Probably next year. He is on a good dose of ARB and beta blocker. Also on statin and Brilinta.

## 2015-06-02 NOTE — Assessment & Plan Note (Signed)
No evidence of critical limb ischemia. No lesions or wounds. No resting claudication. He is on stable cardiac medications which are helpful for PAD. But most importantly, he continues to remain active and exercising a lot for formation of collateral flow.

## 2015-06-02 NOTE — Assessment & Plan Note (Signed)
Well-controlled today. Last muscle and we increased his ARB from losartan to Micardis 80 mg in addition to the HCTZ in carvedilol.

## 2015-06-02 NOTE — Assessment & Plan Note (Signed)
He is far enough out now to be on Brilinta alone without aspirin. Okay to hold if necessary for procedure.

## 2015-06-02 NOTE — Assessment & Plan Note (Signed)
His PCP usually checks his lipids. He is on moderate to high-dose simvastatin plus omega-3 fatty acids. Should be due for follow-up check of labs soon. If he is not at goal, again I would consider either changing to more potent statin such as atorvastatin or rosuvastatin versus adding Zetia.

## 2015-06-02 NOTE — Assessment & Plan Note (Signed)
At the most he has class I heart failure symptoms. On stable dose of ARB and carvedilol. Not requiring any diuretic beyond HCTZ for blood pressure control. No active heart failure symptoms

## 2015-06-02 NOTE — Assessment & Plan Note (Signed)
Significant native disease with a stent to the circumflex and anastomosis of ramus intermedius. On stable regimen.

## 2015-06-06 ENCOUNTER — Other Ambulatory Visit: Payer: Self-pay | Admitting: Cardiology

## 2015-06-06 NOTE — Telephone Encounter (Signed)
Rx request sent to pharmacy.  

## 2015-08-26 ENCOUNTER — Telehealth: Payer: Self-pay | Admitting: *Deleted

## 2015-08-26 NOTE — Telephone Encounter (Signed)
Requesting surgical clearance:   1. Type of surgery: right hand: right hand carpal tunnel release  2. Surgeon: Dr Iran Planas  3. Surgical date: 10/10/2015  4. Medications that need to be held: Brilinta  5. CAD: Yes     6. I will defer to: Dr Izora Gala Ortho Attn Santiago Bur Fax [phone] Phone [phone]

## 2015-08-29 NOTE — Telephone Encounter (Signed)
I saw him in April. He was doing well. Should be low risk patient for low risk surgery.  Okay to hold Brilinta 5 days pre-op. Restart when safe post op.  Glenetta Hew, MD

## 2015-08-30 NOTE — Telephone Encounter (Signed)
Message routed to [place] via [place].

## 2015-09-27 ENCOUNTER — Encounter (HOSPITAL_BASED_OUTPATIENT_CLINIC_OR_DEPARTMENT_OTHER): Payer: Self-pay | Admitting: *Deleted

## 2015-09-27 NOTE — Progress Notes (Signed)
SPOKE W/ PT'S WIFE.  NPO AFTER MN.  ARRIVE AT 0600.  NEEDS ISTAT 8.  CURRENT EKG IN CHART AND EPIC.  WILL TAKE COREG AND MICARDIS AM DOS  W/ SIPS OF WATER.  PT AWARE TO STOP BRILLANTA 10-05-2015 PER CARDIAC CLEARANCE FROM DR HARDING.

## 2015-10-08 NOTE — H&P (Signed)
[name] is an 75 y.o. male.   Chief Complaint: numbness and tingling in right hand HPI:Pt followed in office with numbness and tingling in right hand Pt here for surgery on right hand +emg/ncv  Past Medical History:  Diagnosis Date  . CAD, multiple vessel 05/2010; 03/2012--- cardiologist-  dr Shanon Brow harding   CABG X 5; cath 03/2012 --> LIMA-LAD - patent, SVG-Ramus - anastamotic 80% - DES stent; SVG-RCA & SVG-OM1-OM2-- occluded; DES Stent to Circumflex-OM1    . Cardiomyopathy, ischemic- moderate LVD (EF of roughly 45%) at cath 2/143 11/03/2012  . Carpal tunnel syndrome on right   . DDD (degenerative disc disease), lumbosacral   . Diabetes type 2, controlled ([place])   . 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 Dyslipidemia   . First degree heart block   . GERD (gastroesophageal reflux disease)   . History of shingles    forehead --  dec 2016--  no residual pain  . Hypertension   . Osteoarthritis   . Peripheral arterial disease [facility]) May 2016  (seen by dr berry)   L. ABI 0.58 with high grade L. SFA stenosis and occluded L. DP. R. ABI 0.48 with occluded R. SFA and occluded R. DP.  . 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 S/P drug eluting coronary stent placement 04/08/2012   post CABG--    PTCA / DES to SVG-RI and OM1   . Wears dentures   . Wears glasses     Past Surgical History:  Procedure Laterality Date  . CARDIAC CATHETERIZATION  05-15-2010  dr hilty   abnormal stress test-- 3 vessel CAD/  LVEDP=11mmHg  . CORONARY ARTERY BYPASS GRAFT  05/22/2010  dr Lucianne Lei tright   x 5 (LIMA-LAD, SVG- RI,. SVG-OM1-OM2, SVG-dRCA)  . LEFT HEART CATHETERIZATION WITH CORONARY ANGIOGRAM N/A 04/02/2012   Procedure: LEFT HEART CATHETERIZATION WITH CORONARY ANGIOGRAM;  Surgeon: Leonie Man, MD;  Location: [facility] CATH LAB;  Service: Cardiovascular;--> Occluded SVG-rPDA & native RCA, 100% SVG-OM & Severe anastomotic lesion in SVG-RI.  Patent LIMA-LAD.  Marland Kitchen NM  MYOVIEW LTD  03/25/2012   dr Shelva Majestic   high risk exercise nuclear study w/ scar in the apex and upper mid to basal inferior wall w/ signgicnat additional iscemia inferiorly to lateral-inferiorly from apex to base/  severe LV dysfunction w/ infero-apical dyskinesis and hypocontractility in the inferior and lateral wall, ef 33%  . PERCUTANEOUS CORONARY STENT INTERVENTION (PCI-S) N/A 04/08/2012   Procedure: PERCUTANEOUS CORONARY STENT INTERVENTION (PCI-S);  Surgeon: Leonie Man, MD;  Location: [facility] CATH LAB;  Service: Cardiovascular; Promus Premier DES 2.25 mm x 12 mm - Mid Circumflex-OM1; Promus Premier DES 2.5 mm x 16 mm -- anastomotic SVG-RI  . TOTAL KNEE ARTHROPLASTY Right 10/26/2010  . TRANSTHORACIC ECHOCARDIOGRAM  08/21/2010   mild LVH, EF 45-50%, inferior hypokinesis,  grade 1 diastolic dysfunction/  moderate LAE/   mild MR/  trivial TR/  mild dilated RV  . UMBILICAL HERNIA REPAIR  1980's    Family History  Problem Relation Age of Onset  . Cancer Mother    Social History:  reports that he quit smoking about 35 years ago. His smoking use included Cigarettes. He has never used smokeless tobacco. He reports that he does not drink alcohol or use drugs.  Allergies: No Known Allergies  No prescriptions prior to admission.    No results found for this or any previous visit (from the past  48 hour(s)). No results found.  ROS NO recent illnesses or hospitalizations  Height 6\' 2"  (1.88 m), weight 103 kg (227 lb). Physical Exam  General Appearance:  Alert, cooperative, no distress, appears stated age  Head:  Normocephalic, without obvious abnormality, atraumatic  Eyes:  Pupils equal, conjunctiva/corneas clear,         Throat: Lips, mucosa, and tongue normal; teeth and gums normal  Neck: No visible masses     Lungs:   respirations unlabored  Chest Wall:  No tenderness or deformity  Heart:  Regular rate and rhythm,  Abdomen:   Soft, non-tender,         Extremities: RIGHT HAND:  ABLE TO PALMAR ABDUCT THUMB FINGERS WARM WELL PERFUSED GOOD MUSCLE TONE AND STRENGTH IN HAND GOOD COMPOSITE FLEXION  Pulses: 2+ and symmetric  Skin: Skin color, texture, turgor normal, no rashes or lesions     Neurologic: Normal    Assessment/Plan RIGHT HAND CARPAL TUNNEL SYNDROME  RIGHT HAND CARPAL TUNNEL RELEASE  R/B/A DISCUSSED WITH PT IN OFFICE.  PT VOICED UNDERSTANDING OF PLAN CONSENT SIGNED DAY OF SURGERY PT SEEN AND EXAMINED PRIOR TO OPERATIVE PROCEDURE/DAY OF SURGERY SITE MARKED. QUESTIONS ANSWERED WILL GO HOME FOLLOWING SURGERY  WE ARE PLANNING SURGERY FOR YOUR UPPER EXTREMITY. THE RISKS AND BENEFITS OF SURGERY INCLUDE BUT NOT LIMITED TO BLEEDING INFECTION, DAMAGE TO NEARBY NERVES ARTERIES TENDONS, FAILURE OF SURGERY TO ACCOMPLISH ITS INTENDED GOALS, PERSISTENT SYMPTOMS AND NEED FOR FURTHER SURGICAL INTERVENTION. WITH THIS IN MIND WE WILL PROCEED. I HAVE DISCUSSED WITH THE PATIENT THE PRE AND POSTOPERATIVE REGIMEN AND THE DOS AND DON'TS. PT VOICED UNDERSTANDING AND INFORMED CONSENT SIGNED.  Linna Hoff 10/08/2015, 9:53 AM

## 2015-10-08 NOTE — Brief Op Note (Signed)
10/10/2015  9:56 AM  PATIENT:  [name]  75 y.o. male  PRE-OPERATIVE DIAGNOSIS:  RIGHT HAND CARPAL TUNNEL SYNDROME   POST-OPERATIVE DIAGNOSIS:  * No post-op diagnosis entered *  PROCEDURE:  Procedure(s): RIGHT HAND CARPAL TUNNEL RELEASE (Right)  SURGEON:  Surgeon(s) and Role:    * Iran Planas, MD - Primary  PHYSICIAN ASSISTANT:   ASSISTANTS: none   ANESTHESIA:   MAC  EBL:  No intake/output data recorded.  BLOOD ADMINISTERED:none  DRAINS: none   LOCAL MEDICATIONS USED:  MARCAINE     SPECIMEN:  No Specimen  DISPOSITION OF SPECIMEN:  N/A  COUNTS:  YES  TOURNIQUET:  * No tourniquets in log *  DICTATION: .Other Dictation: Dictation Number [id number]  PLAN OF CARE: Discharge to home after PACU  PATIENT DISPOSITION:  PACU - hemodynamically stable.   Delay start of Pharmacological VTE agent (>24hrs) due to surgical blood loss or risk of bleeding: not applicable

## 2015-10-09 NOTE — Anesthesia Preprocedure Evaluation (Addendum)
Anesthesia Evaluation  Patient identified by MRN, date of birth, ID band Patient awake    Reviewed: Allergy & Precautions, NPO status , Patient's Chart, lab work & pertinent test results  Airway Mallampati: II  TM Distance: >3 FB Neck ROM: Full    Dental no notable dental hx.    Pulmonary neg pulmonary ROS, former smoker,    Pulmonary exam normal breath sounds clear to auscultation       Cardiovascular hypertension, + CAD, + Cardiac Stents, + CABG and + Peripheral Vascular Disease  Normal cardiovascular exam+ dysrhythmias  Rhythm:Regular Rate:Normal  Cath -04-02-12 reviewed: EF 33-45%  Clearance Dr. Ellyn Hack.   Neuro/Psych  Neuromuscular disease negative psych ROS   GI/Hepatic Neg liver ROS, GERD  ,  Endo/Other  diabetes, Type 2, Oral Hypoglycemic Agents  Renal/GU negative Renal ROS  negative genitourinary   Musculoskeletal  (+) Arthritis ,   Abdominal   Peds negative pediatric ROS (+)  Hematology negative hematology ROS (+)   Anesthesia Other Findings   Reproductive/Obstetrics negative OB ROS                           Anesthesia Physical Anesthesia Plan  ASA: III  Anesthesia Plan: MAC   Post-op Pain Management:    Induction: Intravenous  Airway Management Planned: Natural Airway  Additional Equipment:   Intra-op Plan:   Post-operative Plan:   Informed Consent: I have reviewed the patients History and Physical, chart, labs and discussed the procedure including the risks, benefits and alternatives for the proposed anesthesia with the patient or authorized representative who has indicated his/her understanding and acceptance.   Dental advisory given  Plan Discussed with: CRNA  Anesthesia Plan Comments:         Anesthesia Quick Evaluation

## 2015-10-10 ENCOUNTER — Ambulatory Visit (HOSPITAL_BASED_OUTPATIENT_CLINIC_OR_DEPARTMENT_OTHER): Payer: Medicare Other | Admitting: Anesthesiology

## 2015-10-10 ENCOUNTER — Encounter (HOSPITAL_BASED_OUTPATIENT_CLINIC_OR_DEPARTMENT_OTHER)
Admission: RE | Disposition: A | Discharge status: Home / Self Care | Payer: Self-pay | Source: Ambulatory Visit | Attending: Orthopedic Surgery

## 2015-10-10 ENCOUNTER — Ambulatory Visit (HOSPITAL_BASED_OUTPATIENT_CLINIC_OR_DEPARTMENT_OTHER)
Admission: RE | Admit: 2015-10-10 | Discharge: 2015-10-10 | Disposition: A | Discharge status: Home / Self Care | Payer: Medicare Other | Source: Ambulatory Visit | Attending: Orthopedic Surgery | Admitting: Orthopedic Surgery

## 2015-10-10 ENCOUNTER — Encounter (HOSPITAL_BASED_OUTPATIENT_CLINIC_OR_DEPARTMENT_OTHER): Payer: Self-pay | Admitting: Certified Registered"

## 2015-10-10 DIAGNOSIS — K219 Gastro-esophageal reflux disease without esophagitis: Secondary | ICD-10-CM | POA: Diagnosis not present

## 2015-10-10 DIAGNOSIS — Z79899 Other long term (current) drug therapy: Secondary | ICD-10-CM | POA: Insufficient documentation

## 2015-10-10 DIAGNOSIS — Z7984 Long term (current) use of oral hypoglycemic drugs: Secondary | ICD-10-CM | POA: Diagnosis not present

## 2015-10-10 DIAGNOSIS — E1151 Type 2 diabetes mellitus with diabetic peripheral angiopathy without gangrene: Secondary | ICD-10-CM | POA: Insufficient documentation

## 2015-10-10 DIAGNOSIS — Z87891 Personal history of nicotine dependence: Secondary | ICD-10-CM | POA: Insufficient documentation

## 2015-10-10 DIAGNOSIS — G5601 Carpal tunnel syndrome, right upper limb: Secondary | ICD-10-CM | POA: Diagnosis not present

## 2015-10-10 DIAGNOSIS — Z96651 Presence of right artificial knee joint: Secondary | ICD-10-CM | POA: Diagnosis not present

## 2015-10-10 DIAGNOSIS — Z951 Presence of aortocoronary bypass graft: Secondary | ICD-10-CM | POA: Diagnosis not present

## 2015-10-10 DIAGNOSIS — Z955 Presence of coronary angioplasty implant and graft: Secondary | ICD-10-CM | POA: Diagnosis not present

## 2015-10-10 DIAGNOSIS — I2510 Atherosclerotic heart disease of native coronary artery without angina pectoris: Secondary | ICD-10-CM | POA: Diagnosis not present

## 2015-10-10 DIAGNOSIS — I10 Essential (primary) hypertension: Secondary | ICD-10-CM | POA: Insufficient documentation

## 2015-10-10 DIAGNOSIS — I255 Ischemic cardiomyopathy: Secondary | ICD-10-CM | POA: Insufficient documentation

## 2015-10-10 DIAGNOSIS — E785 Hyperlipidemia, unspecified: Secondary | ICD-10-CM | POA: Diagnosis not present

## 2015-10-10 HISTORY — DX: Carpal tunnel syndrome, right upper limb: G56.01

## 2015-10-10 HISTORY — DX: Essential (primary) hypertension: I10

## 2015-10-10 HISTORY — DX: Other intervertebral disc degeneration, lumbosacral region: M51.37

## 2015-10-10 HISTORY — DX: Atrioventricular block, first degree: I44.0

## 2015-10-10 HISTORY — DX: Gastro-esophageal reflux disease without esophagitis: K21.9

## 2015-10-10 HISTORY — DX: Personal history of other infectious and parasitic diseases: Z86.19

## 2015-10-10 HISTORY — DX: Other intervertebral disc degeneration, lumbosacral region without mention of lumbar back pain or lower extremity pain: M51.379

## 2015-10-10 HISTORY — PX: CARPAL TUNNEL RELEASE: SHX101

## 2015-10-10 LAB — POCT I-STAT, CHEM 8
BUN: 20 mg/dL (ref 6–20)
CREATININE: 1.1 mg/dL (ref 0.61–1.24)
Calcium, Ion: 1.35 mmol/L — ABNORMAL HIGH (ref 1.12–1.23)
Chloride: 103 mmol/L (ref 101–111)
Glucose, Bld: 155 mg/dL — ABNORMAL HIGH (ref 65–99)
HEMATOCRIT: 39 % (ref 39.0–52.0)
HEMOGLOBIN: 13.3 g/dL (ref 13.0–17.0)
POTASSIUM: 4.4 mmol/L (ref 3.5–5.1)
SODIUM: 139 mmol/L (ref 135–145)
TCO2: 24 mmol/L (ref 0–100)

## 2015-10-10 LAB — GLUCOSE, CAPILLARY: Glucose-Capillary: 124 mg/dL — ABNORMAL HIGH (ref 65–99)

## 2015-10-10 SURGERY — CARPAL TUNNEL RELEASE
Anesthesia: Monitor Anesthesia Care | Site: Wrist | Laterality: Right

## 2015-10-10 MED ORDER — LACTATED RINGERS IV SOLN
INTRAVENOUS | Status: DC
  Administered 2015-10-10: 07:00:00 via INTRAVENOUS
  Filled 2015-10-10: qty 1000

## 2015-10-10 MED ORDER — CHLORHEXIDINE GLUCONATE 4 % EX LIQD
60.0000 mL | Freq: Once | CUTANEOUS | Status: DC
  Filled 2015-10-10: qty 118

## 2015-10-10 MED ORDER — LIDOCAINE HCL (CARDIAC) 20 MG/ML IV SOLN
INTRAVENOUS | Status: DC | PRN
  Administered 2015-10-10: 40 mg via INTRAVENOUS

## 2015-10-10 MED ORDER — CEFAZOLIN SODIUM-DEXTROSE 2-4 GM/100ML-% IV SOLN
INTRAVENOUS | Status: AC
  Filled 2015-10-10: qty 100

## 2015-10-10 MED ORDER — PROPOFOL 10 MG/ML IV BOLUS
INTRAVENOUS | Status: AC
  Filled 2015-10-10: qty 20

## 2015-10-10 MED ORDER — LIDOCAINE HCL (PF) 1 % IJ SOLN
INTRAMUSCULAR | Status: DC | PRN
  Administered 2015-10-10: 10 mL

## 2015-10-10 MED ORDER — CEFAZOLIN SODIUM-DEXTROSE 2-4 GM/100ML-% IV SOLN
2.0000 g | INTRAVENOUS | Status: AC
  Administered 2015-10-10: 2 g via INTRAVENOUS
  Filled 2015-10-10: qty 100

## 2015-10-10 MED ORDER — BUPIVACAINE HCL (PF) 0.25 % IJ SOLN
INTRAMUSCULAR | Status: DC | PRN
  Administered 2015-10-10: 10 mL

## 2015-10-10 MED ORDER — PROPOFOL 500 MG/50ML IV EMUL
INTRAVENOUS | Status: DC | PRN
  Administered 2015-10-10: 175 ug/kg/min via INTRAVENOUS

## 2015-10-10 MED ORDER — LIDOCAINE HCL (CARDIAC) 20 MG/ML IV SOLN
INTRAVENOUS | Status: AC
  Filled 2015-10-10: qty 5

## 2015-10-10 MED ORDER — FENTANYL CITRATE (PF) 100 MCG/2ML IJ SOLN
INTRAMUSCULAR | Status: DC | PRN
  Administered 2015-10-10: 50 ug via INTRAVENOUS

## 2015-10-10 MED ORDER — FENTANYL CITRATE (PF) 100 MCG/2ML IJ SOLN
INTRAMUSCULAR | Status: AC
  Filled 2015-10-10: qty 2

## 2015-10-10 MED ORDER — PROMETHAZINE HCL 25 MG/ML IJ SOLN
6.2500 mg | INTRAMUSCULAR | Status: DC | PRN
Start: 2015-10-10 — End: 2015-10-10
  Filled 2015-10-10: qty 1

## 2015-10-10 MED ORDER — HYDROCODONE-ACETAMINOPHEN 5-300 MG PO TABS
1.0000 | ORAL_TABLET | Freq: Two times a day (BID) | ORAL | 0 refills | Status: DC | PRN
Start: 2015-10-10 — End: 2016-02-27

## 2015-10-10 SURGICAL SUPPLY — 39 items
BANDAGE ACE 4X5 VEL STRL LF (GAUZE/BANDAGES/DRESSINGS) ×3 IMPLANT
BANDAGE ELASTIC 3 VELCRO ST LF (GAUZE/BANDAGES/DRESSINGS) ×3 IMPLANT
BLADE SURG 15 STRL LF DISP TIS (BLADE) ×1 IMPLANT
BLADE SURG 15 STRL SS (BLADE) ×2
BNDG CONFORM 3 STRL LF (GAUZE/BANDAGES/DRESSINGS) ×3 IMPLANT
BNDG ESMARK 4X9 LF (GAUZE/BANDAGES/DRESSINGS) ×3 IMPLANT
CORDS BIPOLAR (ELECTRODE) ×3 IMPLANT
COVER BACK TABLE 60X90IN (DRAPES) ×3 IMPLANT
CUFF TOURNIQUET SINGLE 18IN (TOURNIQUET CUFF) ×3 IMPLANT
DRAPE EXTREMITY T 121X128X90 (DRAPE) ×3 IMPLANT
DRAPE LG THREE QUARTER DISP (DRAPES) ×3 IMPLANT
DRAPE SURG 17X23 STRL (DRAPES) ×3 IMPLANT
DRSG EMULSION OIL 3X3 NADH (GAUZE/BANDAGES/DRESSINGS) IMPLANT
GAUZE SPONGE 4X4 12PLY STRL (GAUZE/BANDAGES/DRESSINGS) ×3 IMPLANT
GAUZE XEROFORM 1X8 LF (GAUZE/BANDAGES/DRESSINGS) ×3 IMPLANT
GLOVE BIO SURGEON STRL SZ8 (GLOVE) ×3 IMPLANT
GLOVE BIOGEL PI IND STRL 8.5 (GLOVE) ×1 IMPLANT
GLOVE BIOGEL PI INDICATOR 8.5 (GLOVE) ×2
GOWN STRL REUS W/ TWL LRG LVL3 (GOWN DISPOSABLE) ×2 IMPLANT
GOWN STRL REUS W/TWL LRG LVL3 (GOWN DISPOSABLE) ×4
KIT ROOM TURNOVER WOR (KITS) ×3 IMPLANT
NDL SAFETY ECLIPSE 18X1.5 (NEEDLE) IMPLANT
NEEDLE HYPO 18GX1.5 SHARP (NEEDLE)
NEEDLE HYPO 25X1 1.5 SAFETY (NEEDLE) ×6 IMPLANT
NS IRRIG 500ML POUR BTL (IV SOLUTION) ×3 IMPLANT
PACK BASIN DAY SURGERY FS (CUSTOM PROCEDURE TRAY) ×3 IMPLANT
PAD ALCOHOL SWAB (MISCELLANEOUS) ×12 IMPLANT
PAD CAST 3X4 CTTN HI CHSV (CAST SUPPLIES) IMPLANT
PADDING CAST COTTON 3X4 STRL (CAST SUPPLIES)
SPONGE GAUZE 4X4 12PLY STER LF (GAUZE/BANDAGES/DRESSINGS) ×3 IMPLANT
STOCKINETTE 4X48 STRL (DRAPES) ×3 IMPLANT
SUT PROLENE 4 0 PS 2 18 (SUTURE) ×3 IMPLANT
SYR BULB 3OZ (MISCELLANEOUS) ×3 IMPLANT
SYR CONTROL 10ML LL (SYRINGE) ×6 IMPLANT
TOWEL OR 17X24 6PK STRL BLUE (TOWEL DISPOSABLE) ×3 IMPLANT
TRAY DSU PREP LF (CUSTOM PROCEDURE TRAY) ×3 IMPLANT
TUBE CONNECTING 12'X1/4 (SUCTIONS)
TUBE CONNECTING 12X1/4 (SUCTIONS) IMPLANT
UNDERPAD 30X30 INCONTINENT (UNDERPADS AND DIAPERS) ×3 IMPLANT

## 2015-10-10 NOTE — Transfer of Care (Signed)
Immediate Anesthesia Transfer of Care Note  Patient: [name]  Procedure(s) Performed: Procedure(s) (LRB): RIGHT HAND CARPAL TUNNEL RELEASE (Right)  Patient Location: PACU  Anesthesia Type: General  Level of Consciousness: awake, oriented, sedated and patient cooperative  Airway & Oxygen Therapy: Patient Spontanous Breathing and Patient connected to face mask oxygen  Post-op Assessment: Report given to PACU RN and Post -op Vital signs reviewed and stable  Post vital signs: Reviewed and stable  Complications: No apparent anesthesia complications

## 2015-10-10 NOTE — Anesthesia Postprocedure Evaluation (Signed)
Anesthesia Post Note  Patient: [name]  Procedure(s) Performed: Procedure(s) (LRB): RIGHT HAND CARPAL TUNNEL RELEASE (Right)  Patient location during evaluation: PACU Anesthesia Type: MAC Level of consciousness: awake and alert Pain management: pain level controlled Vital Signs Assessment: post-procedure vital signs reviewed and stable Respiratory status: spontaneous breathing, nonlabored ventilation, respiratory function stable and patient connected to nasal cannula oxygen Cardiovascular status: stable and blood pressure returned to baseline Anesthetic complications: no    Last Vitals:  Vitals:   10/10/15 0647 10/10/15 0813  BP: (!) 168/66 122/63  Pulse: 66 (!) 47  Resp: 18 16  Temp: 36.5 C 36.6 C    Last Pain:  Vitals:   10/10/15 0647  TempSrc: Oral                 Toben Acuna J

## 2015-10-10 NOTE — Discharge Instructions (Signed)
KEEP BANDAGE CLEAN AND DRY CALL OFFICE FOR F/U APPT [phone] in 14 days [facility] CELL [phone] KEEP HAND ELEVATED ABOVE HEART OK TO APPLY ICE TO OPERATIVE AREA CONTACT OFFICE IF ANY WORSENING PAIN OR CONCERNS.        HAND SURGERY    HOME CARE INSTRUCTIONS    The following instructions have been prepared to help you care for yourself upon your return home today.  Wound Care:  Keep your hand elevated above the level of your heart. Do not allow it to dangle by your side. Keep the dressing dry and do not remove it unless your doctor advises you to do so. He will usually change it at the time of you post-op visit. Moving your fingers is advised to stimulate circulation but will depend on the site of your surgery. Of course, if you have a splint applied your doctor will advise you about movement.  Activity:  Do not drive or operate machinery today. Rest today and then you may return to your normal activity and work as indicated by your physician.  Diet: Drink liquids today or eat a light diet. You may resume a regular diet tomorrow.  General expectations: Pain for two or three days. Fingers may become slightly swollen.   Unexpected Observations- Call your doctor if any of these occur: Severe pain not relieved by pain medication. Elevated temperature. Dressing soaked with blood. Inability to move fingers. White or bluish color to fingers.      Post Anesthesia Home Care Instructions  Activity: Get plenty of rest for the remainder of the day. A responsible adult should stay with you for 24 hours following the procedure.  For the next 24 hours, DO NOT: -Drive a car -Paediatric nurse -Drink alcoholic beverages -Take any medication unless instructed by your physician -Make any legal decisions or sign important papers.  Meals: Start with liquid foods such as gelatin or soup. Progress to regular foods as tolerated. Avoid greasy, spicy, heavy foods. If nausea and/or  vomiting occur, drink only clear liquids until the nausea and/or vomiting subsides. Call your physician if vomiting continues.  Special Instructions/Symptoms: Your throat may feel dry or sore from the anesthesia or the breathing tube placed in your throat during surgery. If this causes discomfort, gargle with warm salt water. The discomfort should disappear within 24 hours.  If you had a scopolamine patch placed behind your ear for the management of post- operative nausea and/or vomiting:  1. The medication in the patch is effective for 72 hours, after which it should be removed.  Wrap patch in a tissue and discard in the trash. Wash hands thoroughly with soap and water. 2. You may remove the patch earlier than 72 hours if you experience unpleasant side effects which may include dry mouth, dizziness or visual disturbances. 3. Avoid touching the patch. Wash your hands with soap and water after contact with the patch.

## 2015-10-11 NOTE — Op Note (Signed)
NAMEERACLIO, LORD NO.:  [id number]  MEDICAL RECORD NO.:  [id number]  LOCATION:                                 FACILITY:  PHYSICIAN:  Linna Hoff IV, M.D.DATE OF BIRTH:  04/22/1940  DATE OF PROCEDURE:  10/10/2015 DATE OF DISCHARGE:                              OPERATIVE REPORT   POSTOPERATIVE DIAGNOSIS:  Right hand carpal tunnel syndrome.  POSTOPERATIVE DIAGNOSIS:  Right hand carpal tunnel syndrome.  ATTENDING PHYSICIAN:  Linna Hoff, M.D. who scrubbed and was present for the entire procedure.  ASSISTANT SURGEON:  None.  ANESTHESIA:  1% Xylocaine, 0.4% Marcaine, local block with IV sedation.  SURGICAL PROCEDURE:  Right hand carpal tunnel release.  SURGICAL INDICATIONS:  [name] is a right-hand-dominant gentleman with signs and symptoms consistent with right hand carpal tunnel syndrome.  This has been refractory to conservative treatment.  Risks, benefits, and alternatives were discussed in detail with the patient. Signed informed consent was obtained.  Risks include, but not limited to, bleeding, infection, damage to nearby nerves, arteries, or tendons; loss of motion of the wrist and digits, incomplete relief of symptoms, and need for further surgical intervention.  DESCRIPTION OF PROCEDURE:  The patient was properly identified in the preoperative holding area and marked with a permanent marker made on the right hand to indicate the correct operative site.  The patient was then brought back to the operating room, placed supine on the anesthesia room table where the IV sedation was administered.  The patient tolerated this well.  A well-padded tourniquet was placed on the right forearm and sealed with 1000 Drape.  The right upper extremity was then prepped and draped in normal sterile fashion.  Time-out was called, correct site was identified, and the procedure begun.  Attention was then turned to the right hand.  Several centimeter  incision was made directly in mid palm. The limb was then elevated and tourniquet insufflated.  Dissection was carried down through the skin and subcutaneous tissue.  The palmar fascia was incised longitudinally.  Direct exposure to the transverse carpal ligament was then carried down, and under direct visualization, distal one-half of the transverse carpal ligament released distally. Further exposure was then carried approximating remaining portion of transverse carpal ligament as well as portion of the antebrachial fascia was then released.  The wound was then thoroughly irrigated.  The contents of carpal canal were then inspected.  No other abnormalities were noted.  The wound was then thoroughly irrigated.  The skin was then closed using horizontal mattress Prolene sutures.  Xeroform dressing and sterile compressive bandages were then applied.  The patient tolerated the procedure well, returned to recovery room in good condition.  POSTPROCEDURAL PLAN:  The patient will be discharged to home, seen back in the office in approximately 2 weeks for wound check, suture removal, and transition to a GelFlex glove.  Lifting restrictions for the first 4 weeks and then try to resume full activity around the 6-week mark.     [name], M.D.   ______________________________ [name], M.D.    FWO/MEDQ  D:  10/10/2015  T:  10/10/2015  Job:  [id number]

## 2015-10-12 ENCOUNTER — Encounter (HOSPITAL_BASED_OUTPATIENT_CLINIC_OR_DEPARTMENT_OTHER): Payer: Self-pay | Admitting: Orthopedic Surgery

## 2015-11-06 ENCOUNTER — Other Ambulatory Visit: Payer: Self-pay | Admitting: Cardiology

## 2015-11-19 ENCOUNTER — Other Ambulatory Visit: Payer: Self-pay | Admitting: Cardiology

## 2015-12-05 ENCOUNTER — Encounter: Payer: Self-pay | Admitting: Cardiology

## 2015-12-05 ENCOUNTER — Ambulatory Visit (INDEPENDENT_AMBULATORY_CARE_PROVIDER_SITE_OTHER): Payer: Medicare Other | Admitting: Cardiology

## 2015-12-05 VITALS — BP 175/82 | HR 62 | Ht 74.0 in | Wt 226.8 lb

## 2015-12-05 DIAGNOSIS — Z955 Presence of coronary angioplasty implant and graft: Secondary | ICD-10-CM | POA: Diagnosis not present

## 2015-12-05 DIAGNOSIS — I10 Essential (primary) hypertension: Secondary | ICD-10-CM | POA: Diagnosis not present

## 2015-12-05 DIAGNOSIS — E1169 Type 2 diabetes mellitus with other specified complication: Secondary | ICD-10-CM

## 2015-12-05 DIAGNOSIS — I25708 Atherosclerosis of coronary artery bypass graft(s), unspecified, with other forms of angina pectoris: Secondary | ICD-10-CM | POA: Diagnosis not present

## 2015-12-05 DIAGNOSIS — I739 Peripheral vascular disease, unspecified: Secondary | ICD-10-CM

## 2015-12-05 DIAGNOSIS — E785 Hyperlipidemia, unspecified: Secondary | ICD-10-CM | POA: Diagnosis not present

## 2015-12-05 DIAGNOSIS — I255 Ischemic cardiomyopathy: Secondary | ICD-10-CM | POA: Diagnosis not present

## 2015-12-05 DIAGNOSIS — I2510 Atherosclerotic heart disease of native coronary artery without angina pectoris: Secondary | ICD-10-CM

## 2015-12-05 DIAGNOSIS — E669 Obesity, unspecified: Secondary | ICD-10-CM

## 2015-12-05 DIAGNOSIS — Z9861 Coronary angioplasty status: Secondary | ICD-10-CM

## 2015-12-05 NOTE — Patient Instructions (Signed)
Medication Instructions:  NO CHANGES  Labwork: NONE  Testing/Procedures: Your physician has requested that you have en exercise stress myoview IN February OR MARCH 2018. For further information please visit [email or web address]. Please follow instruction sheet, as given.  Follow-Up: Your physician wants you to follow-up in: [place] DR HARDING 30 MIN You will receive a reminder letter in the mail two months in advance. If you don't receive a letter, please call our office to schedule the follow-up appointment.   Any Other Special Instructions Will Be Listed Below (If Applicable). MONITOR BLOOD PRESSURE AND IF CONTINUES TO BE HIGH DISCUSS ADDING MEDICATION WITH PRIMARY CARE PHYSICIAN   If you need a refill on your cardiac medications before your next appointment, please call your pharmacy.

## 2015-12-05 NOTE — Progress Notes (Signed)
PCP: Neale Burly, MD  Clinic Note: Chief Complaint  Patient presents with  . Coronary Artery Disease    History of essentially asymptomatic multivessel coronary disease followed by CABG, graft occlusion and native PCI.  Marland Kitchen PAD    Class 1-2 claudication    HPI: [name] is a 75 y.o. male with a PMH below who presents today for 6 month f/u of CAD-CABG-PCI & PAD. Mr. Ancar initially underwent cardiac evaluation for preoperative risk gratification. He had abnormal stress test and was found to have multivessel CAD on cath with reduced ejection fraction. He was referred for CABG in April 2012. Roughly 2 years later he had an abnormal stress test and was taken back to the Cath Lab where he was found to have occluded vein graft to the RCA as well as being after OM1 and OM 2. He subsequently underwent PCI of the native circumflex-OM 1 as well as the SVG-Ramus Intermedius. Interestingly, [name] doesn't usually have much the way of any symptoms when he has issues with his coronary disease, he simply does notes some fatigue.  [name] was last seen in Oct 2016. He has also been followed by Dr. Gwenlyn Found for claudication. He does have pretty significant disease, but they're currently treating medically and recommending continued walking.  Recent Hospitalizations: R carpal tunnel Sgx  Studies Reviewed: none  Interval History: [name] presents today actually doing pretty well. He doesn't have much the way of any significant symptoms. He does have notable claudication symptoms but nothing acute that limits him from doing the routine exercise usually does. He pretty much does daily workouts at the [facility] -- unfortunately, he has not been doing as much of his [employer] workouts recently because he had been recovering from his hand surgery in August..  He will have to stop because of claudication when walking or doing the NuStep machine, but after a minute or so is able to get red back onto it. In  doing these exercises he is not noticing any significant chest tightness or pressure or dyspnea either. It takes a little while to get going, once he gets going he does fine. He denies any significant heart failure symptoms of PND, orthopnea but does have some chronic lower TIMI edema which is relatively stable. He denies any rapid or irregular heartbeat/palpitations except when exercising he feels is starting to walk. He denies any syncope/near syncope or TIA/amaurosis fugax.  ROS: A comprehensive was performed. Review of Systems  Constitutional: Negative for malaise/fatigue and weight loss (He actually has put on some weight, he got out of the habit of doing exercise for little while, and has not been paying as much attention to his eating.).  HENT: Negative for nosebleeds.   Eyes: Negative for blurred vision.  Respiratory: Negative for cough, shortness of breath and wheezing.   Cardiovascular: Positive for claudication (Relatively stable). Negative for leg swelling.  Gastrointestinal: Positive for heartburn (Depending on what he eats.). Negative for blood in stool and melena.  Genitourinary: Negative for hematuria.  Musculoskeletal: Positive for back pain and joint pain. Negative for myalgias.  Neurological: Positive for dizziness. Negative for sensory change, speech change, focal weakness, loss of consciousness and headaches.  Endo/Heme/Allergies: Does not bruise/bleed easily.  Psychiatric/Behavioral: Negative for depression, hallucinations and memory loss. The patient is not nervous/anxious.   All other systems reviewed and are negative.   Past Medical History:  Diagnosis Date  . CAD, multiple vessel 05/2010; 03/2012--- cardiologist-  dr Shanon Brow harding  CABG X 5; cath 03/2012 --> LIMA-LAD - patent, SVG-Ramus - anastamotic 80% - DES stent; SVG-RCA & SVG-OM1-OM2-- occluded; DES Stent to Circumflex-OM1    . Cardiomyopathy, ischemic- moderate LVD (EF of roughly 45%) at cath 2/143 11/03/2012  .  Carpal tunnel syndrome on right   . DDD (degenerative disc disease), lumbosacral   . Diabetes type 2, controlled ([place])   . 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 Dyslipidemia   . First degree heart block   . GERD (gastroesophageal reflux disease)   . History of shingles    forehead --  dec 2016--  no residual pain  . Hypertension   . Osteoarthritis   . Peripheral arterial disease [facility]) May 2016  (seen by dr berry)   L. ABI 0.58 with high grade L. SFA stenosis and occluded L. DP. R. ABI 0.48 with occluded R. SFA and occluded R. DP.  . 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 S/P drug eluting coronary stent placement 04/08/2012   post CABG--    PTCA / DES to SVG-RI and OM1   . Wears dentures   . Wears glasses     Past Surgical History:  Procedure Laterality Date  . CARDIAC CATHETERIZATION  05-15-2010  dr hilty   abnormal stress test-- 3 vessel CAD/  LVEDP=83mmHg  . CARPAL TUNNEL RELEASE Right 10/10/2015   Procedure: RIGHT HAND CARPAL TUNNEL RELEASE;  Surgeon: Iran Planas, MD;  Location: [facility];  Service: Orthopedics;  Laterality: Right;  . CORONARY ARTERY BYPASS GRAFT  05/22/2010  dr Lucianne Lei tright   x 5 (LIMA-LAD, SVG- RI,. SVG-OM1-OM2, SVG-dRCA)  . LEFT HEART CATHETERIZATION WITH CORONARY ANGIOGRAM N/A 04/02/2012   Procedure: LEFT HEART CATHETERIZATION WITH CORONARY ANGIOGRAM;  Surgeon: Leonie Man, MD;  Location: [facility] CATH LAB;  Service: Cardiovascular;--> Occluded SVG-rPDA & native RCA, 100% SVG-OM & Severe anastomotic lesion in SVG-RI.  Patent LIMA-LAD.  Marland Kitchen NM MYOVIEW LTD  03/25/2012   dr Shelva Majestic   high risk exercise nuclear study w/ scar in the apex and upper mid to basal inferior wall w/ signgicnat additional iscemia inferiorly to lateral-inferiorly from apex to base/  severe LV dysfunction w/ infero-apical dyskinesis and hypocontractility in the inferior and lateral wall, ef 33%  . PERCUTANEOUS CORONARY STENT  INTERVENTION (PCI-S) N/A 04/08/2012   Procedure: PERCUTANEOUS CORONARY STENT INTERVENTION (PCI-S);  Surgeon: Leonie Man, MD;  Location[facility] C[facility] LAB;  Service: Cardiovascular; Promus Premier DES 2.25 mm x 12 mm - Mid Circumflex-OM1; Promus Premier DES 2.5 mm x 16 mm -- anastomotic SVG-RI  . TOTAL KNEE ARTHROPLASTY Right 10/26/2010  . TRANSTHORACIC ECHOCARDIOGRAM  08/21/2010   mild LVH, EF 45-50%, inferior hypokinesis,  grade 1 diastolic dysfunction/  moderate LAE/   mild MR/  trivial TR/  mild dilated RV  . UMBILICAL HERNIA REPAIR  1980's    Prior to Admission medications   Medication Sig Start Date Taking? Authorizing Provider  BRILINTA 90 MG TABS tablet TAKE 1 TABLET BY MOUTH TWICE A DAY 11/07/15 Yes Leonie Man, MD  Ca Carbonate-Mag Hydroxide (ROLAIDS PO) Take by mouth as needed.  Yes Historical Provider, MD  carvedilol (COREG) 25 MG tablet TAKE 1 TABLET (25 MG TOTAL) BY MOUTH 2 (TWO) TIMES DAILY. 06/06/15 Yes Troy Sine, MD  fish oil-omega-3 fatty acids 1000 MG capsule Take 1 g by mouth daily.   Yes Historical Provider, MD  hydrochlorothiazide (HYDRODIURIL) 25 MG tablet Take  1 tablet (25 mg total) by mouth daily. Patient taking differently: Take 25 mg by mouth every morning.  05/31/14 Yes Leonie Man, MD  Hydrocodone-Acetaminophen (VICODIN) 5-300 MG TABS Take 1 tablet by mouth 2 (two) times daily as needed (PAIN). 10/10/15 Yes Iran Planas, MD  metFORMIN (GLUCOPHAGE) 500 MG tablet Take 500 mg by mouth daily with breakfast.  Yes Historical Provider, MD  Naproxen Sodium (ALEVE) 220 MG CAPS Take by mouth as needed.  Yes Historical Provider, MD  nitroGLYCERIN (NITROSTAT) 0.4 MG SL tablet Place 1 tablet (0.4 mg total) under the tongue every 5 (five) minutes as needed for chest pain. 04/09/12 Yes Luke K Kilroy, PA-C  simvastatin (ZOCOR) 40 MG tablet TAKE 1 TABLET (40 MG TOTAL) BY MOUTH EVERY EVENING. 08/25/14 Yes Leonie Man, MD  telmisartan (MICARDIS) 80 MG tablet TAKE 1 TABLET (80  MG TOTAL) BY MOUTH DAILY. 11/21/15 Yes Leonie Man, MD  ticagrelor (BRILINTA) 90 MG TABS tablet Take 1 tablet (90 mg total) by mouth 2 (two) times daily. Patient taking differently: Take 90 mg by mouth 2 (two) times daily. Pt to stop 10-05-2015 prior to surgery 10-10-2015 05/31/14 Yes Leonie Man, MD    No Known Allergies  Social History   Social History  . Marital status: Married    Spouse name: N/A  . Number of children: N/A  . Years of education: N/A   Social History Main Topics  . Smoking status: Former Smoker    Types: Cigarettes    Quit date: 09/26/1980  . Smokeless tobacco: Never Used  . Alcohol use No  . Drug use: No  . Sexual activity: Not Asked   Other Topics Concern  . None   Social History Narrative   He is a married father of 2. He exercises routinely at the [facility].Marland Kitchen    He quit smoking about 30 years ago and has social alcohol.    Family History  Problem Relation Age of Onset  . Cancer Mother     Wt Readings from Last 3 Encounters:  12/05/15 102.9 kg (226 lb 12.8 oz)  10/10/15 101.2 kg (223 lb)  05/31/15 103.3 kg (227 lb 12.8 oz)    PHYSICAL EXAM BP (!) 175/82   Pulse 62   Ht 6\' 2"  (1.88 m)   Wt 102.9 kg (226 lb 12.8 oz)   BMI 29.12 kg/m  General appearance: alert, cooperative, no distress, mildly obese and BMI would not suggest obesity but simply overweight. Well-nourished. Pleasant mood and affect. Otherwise healthy-appearing.  HEENT: [place]/AT, EOMI, MMM, anicteric sclera Neck: no adenopathy, no carotid bruit, no JVD and supple, symmetrical, trachea midline  Lungs: CTAB, normal percussion bilaterally and Nonlabored, good air movement  Heart: RRR, S1& S2 normal, no murmur, click, rub or gallop and normal apical impulse  Abdomen: soft, non-tender; bowel sounds normal; no masses, no organomegaly  Extremities: extremities normal, atraumatic, no cyanosis or edema  Pulses: 2+ and symmetric  Neurologic: Alert and oriented X 3, normal strength  and tone. Normal symmetric reflexes. Normal coordination and gait   Adult ECG Report  Rate: 62;  Rhythm: normal sinus rhythm and Borderline 1 AVB block (194). Old LVH changes. Inferior MI, age undetermined.;   Narrative Interpretation: Stable EKG. no change from last year or April 2017   Other studies Reviewed: Additional studies/ records that were reviewed today include:  Recent Labs:  Checked by PCP No results found for: CHOL, HDL, LDLCALC, LDLDIRECT, TRIG, CHOLHDL   ASSESSMENT / PLAN: Problem List  Items Addressed This Visit    S/P elective  PTCA / DES to SVG-RI and OM1 04/08/12 (Chronic)    Doing fairly well. No bleeding issues on Brilinta alone.      Presence of drug coated stent in left circumflex coronary artery; Promus Premier 2.25 mm x 12 mm (Chronic)   Peripheral arterial disease (HCC) (Chronic)    No evidence of critical ischemia. No resting claudication, but does have stable non-limiting claudication symptoms. Ex line continued to remain active to maintain collateral flow.  He should be due for follow-up Doppler studies, if not checked at next visit, will go ahead and order them for follow-up.      Mild obesity (Chronic)    His sort of plateaued with weight loss. I continued to stress the importance of dietary modification and increased exercise. Thankfully, his BMI places him just below the "obesity threshold "      Essential hypertension (Chronic)    Blood pressure is finding for him being this out of control. We switched him from losartan to telmisartan (Micardis) last visit and added HCTZ, but blood pressure still out of control.  All 3 of medications is currently on are maxed out, I would like for him to follow his blood pressures over the next few months. If they continue to be elevated, may need to consider adding amlodipine or other dihydropyridine calcium channel blocker.      Relevant Orders   EKG 12-Lead (Completed)   Myocardial Perfusion Imaging    Dyslipidemia associated with type 2 diabetes mellitus (HCC) (Chronic)    Lipids are usually followed by his PCP. Unfortunately I don't have the labs. He is on simvastatin 40 mg daily. If he is not at goal, would consider switching to a more potent statin such as atorvastatin or Crestor      Relevant Orders   EKG 12-Lead (Completed)   Myocardial Perfusion Imaging   Cardiomyopathy, ischemic- moderate LVD (EF of roughly 45%) at cath 2/143 (Chronic)    Despite having a mild-moderately reduced ejection fraction, he really does not have much the way of heart failure symptoms. He probably is more limited by claudication and dyspnea. He is somewhat deconditioned out of shape. Is not on a standing dose of diuretic besides HCTZ, but is on a good stable dose of ARB and carvedilol.      CAD, CABG X 18 May 2010, progression at cath 04/02/12 -- status post PCI to SVG-RI & native Cx-OM. - Primary (Chronic)    Significant native coronary disease initially treated with CABG followed by then stents in the native circumflex as well as the graft anastomosis to the ramus intermedius. He continues to deny any active angina or heart failure symptoms. He never had the symptoms at the time of his prior evaluations as well.  Plan: Continue current dose of carvedilol, telmisartan, simvastatin and Brilinta.  Especially as he has not been symptomatic with significant disease, we will plan routine follow-up Myoview next spring prior to his 6 month follow-up.      Relevant Orders   EKG 12-Lead (Completed)   Myocardial Perfusion Imaging   Atherosclerotic heart disease of artery bypass graft - occluded SVG-RCA and occluded SVG-OM. Status post PCI to SVG-RI (Chronic)    As above. No active symptoms. On stable medications.   Plan: Continue current regimen for now. May need additional blood pressure control. Recheck Myoview in February and in March 2016       Other Visit Diagnoses   None.  Current medicines are  reviewed at length with the patient today. (+/- concerns) None The following changes have been made: None  Patient Instructions  Medication Instructions:  NO CHANGES  Labwork: NONE  Testing/Procedures: Your physician has requested that you have en exercise stress myoview IN February OR MARCH 2018. For further information please visit [email or web address]. Please follow instruction sheet, as given.  Follow-Up: Your physician wants you to follow-up in: [place] DR HARDING 30 MIN You will receive a reminder letter in the mail two months in advance. If you don't receive a letter, please call our office to schedule the follow-up appointment.   Any Other Special Instructions Will Be Listed Below (If Applicable). MONITOR BLOOD PRESSURE AND IF CONTINUES TO BE HIGH DISCUSS ADDING MEDICATION WITH PRIMARY CARE PHYSICIAN   If you need a refill on your cardiac medications before your next appointment, please call your pharmacy.    Studies Ordered:   Orders Placed This Encounter  Procedures  . Myocardial Perfusion Imaging  . EKG 12-Lead     Glenetta Hew, M.D., M.S. Interventional Cardiologist   Pager # [phone] Phone # [phone] [address] [place],  [address]

## 2015-12-06 ENCOUNTER — Encounter: Payer: Self-pay | Admitting: Cardiology

## 2015-12-06 NOTE — Assessment & Plan Note (Signed)
Blood pressure is finding for him being this out of control. We switched him from losartan to telmisartan (Micardis) last visit and added HCTZ, but blood pressure still out of control.  All 3 of medications is currently on are maxed out, I would like for him to follow his blood pressures over the next few months. If they continue to be elevated, may need to consider adding amlodipine or other dihydropyridine calcium channel blocker.

## 2015-12-06 NOTE — Assessment & Plan Note (Signed)
No evidence of critical ischemia. No resting claudication, but does have stable non-limiting claudication symptoms. Ex line continued to remain active to maintain collateral flow.  He should be due for follow-up Doppler studies, if not checked at next visit, will go ahead and order them for follow-up.

## 2015-12-06 NOTE — Assessment & Plan Note (Signed)
Doing fairly well. No bleeding issues on Brilinta alone.

## 2015-12-06 NOTE — Assessment & Plan Note (Signed)
Significant native coronary disease initially treated with CABG followed by then stents in the native circumflex as well as the graft anastomosis to the ramus intermedius. He continues to deny any active angina or heart failure symptoms. He never had the symptoms at the time of his prior evaluations as well.  Plan: Continue current dose of carvedilol, telmisartan, simvastatin and Brilinta.  Especially as he has not been symptomatic with significant disease, we will plan routine follow-up Myoview next spring prior to his 6 month follow-up.

## 2015-12-06 NOTE — Assessment & Plan Note (Signed)
Despite having a mild-moderately reduced ejection fraction, he really does not have much the way of heart failure symptoms. He probably is more limited by claudication and dyspnea. He is somewhat deconditioned out of shape. Is not on a standing dose of diuretic besides HCTZ, but is on a good stable dose of ARB and carvedilol.

## 2015-12-06 NOTE — Assessment & Plan Note (Signed)
Lipids are usually followed by his PCP. Unfortunately I don't have the labs. He is on simvastatin 40 mg daily. If he is not at goal, would consider switching to a more potent statin such as atorvastatin or Crestor

## 2015-12-06 NOTE — Assessment & Plan Note (Signed)
As above. No active symptoms. On stable medications.   Plan: Continue current regimen for now. May need additional blood pressure control. Recheck Myoview in February and in March 2016

## 2015-12-06 NOTE — Assessment & Plan Note (Addendum)
His sort of plateaued with weight loss. I continued to stress the importance of dietary modification and increased exercise. Thankfully, his BMI places him just below the "obesity threshold "

## 2015-12-12 ENCOUNTER — Other Ambulatory Visit: Payer: Self-pay | Admitting: Cardiology

## 2016-02-03 ENCOUNTER — Telehealth: Payer: Self-pay | Admitting: *Deleted

## 2016-02-03 NOTE — Telephone Encounter (Signed)
Patient came by office with clearance form to completed. Will defer to Dr Ellyn Hack  Request for surgical clearance:  1. What type of surgery is being performed? Lumbar decompression in situ Fusion L3-5  2. When is this surgery scheduled? TBA   3. Are there any medications that need to be held prior to surgery and how long?BRILINTA  4. Name of physician performing surgery? DR [facility]  BROOKS  5. What is your office phone and fax number? [place]  PHONE   [phone];  Fax  [phone]

## 2016-02-06 NOTE — Telephone Encounter (Signed)
Okay to stop Brilinta 7 days pre-op. Can hold up to 34 days postop.  He has not had any active cardiac symptoms as of his October visit.  He should be a low risk patient for a low-risk surgery from a cardiac standpoint.  Continue other current medications.   Glenetta Hew, M.D., M.S. Interventional Cardiologist   Pager # [phone] Phone # [phone] [address] [place], [place] [address]

## 2016-02-07 NOTE — Telephone Encounter (Signed)
Spoke to wife instruction given

## 2016-02-07 NOTE — Telephone Encounter (Signed)
Routed information to DR Brooks's office

## 2016-02-14 LAB — PROTIME-INR

## 2016-02-17 ENCOUNTER — Telehealth: Payer: Self-pay | Admitting: *Deleted

## 2016-02-17 ENCOUNTER — Ambulatory Visit: Payer: Self-pay | Admitting: Physician Assistant

## 2016-02-17 NOTE — Telephone Encounter (Signed)
Informed wife awaiting call back , if no call will contact on Monday .- rerouted clearance Wife verbalized understanding

## 2016-02-17 NOTE — Telephone Encounter (Signed)
Scheduler out of office today , left message for covering scheduler to call back  Re routed clearance to office.

## 2016-02-17 NOTE — Telephone Encounter (Signed)
Wife called to inform ,office that Dr Valla Leaver office states they have not received clearance from cardiology for surgery.  RN INFORMED WIFE INFORMATION WAS SENT TO OFFICE ON 02/07/16, will call Dr Valla Leaver office and talk to scheduler.

## 2016-02-20 ENCOUNTER — Telehealth: Payer: Self-pay | Admitting: Cardiology

## 2016-02-20 NOTE — Telephone Encounter (Signed)
New Message     Per wife yes they got letter from Dr Rolena Infante for the surgery

## 2016-02-20 NOTE — Telephone Encounter (Signed)
fyi

## 2016-03-01 ENCOUNTER — Other Ambulatory Visit: Payer: Self-pay | Admitting: Cardiovascular Disease

## 2016-03-01 NOTE — Pre-Procedure Instructions (Signed)
[name]  03/01/2016      [id number] [address] [place] [address] Phone: [phone] Fax: [phone]    Your procedure is scheduled on Thurs, Jan 25 @ 12:30 PM  Report to [facility] Admitting at 10:30 AM  Call this number if you have problems the morning of surgery:  [phone]   Remember:  Do not eat food or drink liquids after midnight.  Take these medicines the morning of surgery with A SIP OF WATER Carvedilol(Coreg)              Stop Brilinta 7 days prior to surgery as indicated by Dr.Harding. Also stop taking Naproxen and Fish Oil. No Goody's,BC's,Aleve,Advil,Motrin,ibuprofen,or any Herbal Medications.      How to Manage Your Diabetes Before and After Surgery  Why is it important to control my blood sugar before and after surgery? . Improving blood sugar levels before and after surgery helps healing and can limit problems. . A way of improving blood sugar control is eating a healthy diet by: o  Eating less sugar and carbohydrates o  Increasing activity/exercise o  Talking with your doctor about reaching your blood sugar goals . High blood sugars (greater than 180 mg/dL) can raise your risk of infections and slow your recovery, so you will need to focus on controlling your diabetes during the weeks before surgery. . Make sure that the doctor who takes care of your diabetes knows about your planned surgery including the date and location.  How do I manage my blood sugar before surgery? . Check your blood sugar at least 4 times a day, starting 2 days before surgery, to make sure that the level is not too high or low. o Check your blood sugar the morning of your surgery when you wake up and every 2 hours until you get to the Short Stay unit. . If your blood sugar is less than 70 mg/dL, you will need to treat for low blood sugar: o Do not take insulin. o Treat a  low blood sugar (less than 70 mg/dL) with  cup of clear juice (cranberry or apple), 4 glucose tablets, OR glucose gel. o Recheck blood sugar in 15 minutes after treatment (to make sure it is greater than 70 mg/dL). If your blood sugar is not greater than 70 mg/dL on recheck, call [phone] for further instructions. . Report your blood sugar to the short stay nurse when you get to Short Stay.  . If you are admitted to the hospital after surgery: o Your blood sugar will be checked by the staff and you will probably be given insulin after surgery (instead of oral diabetes medicines) to make sure you have good blood sugar levels. o The goal for blood sugar control after surgery is 80-180 mg/dL.              WHAT DO I DO ABOUT MY DIABETES MEDICATION?   Marland Kitchen Do not take oral diabetes medicines (pills) the morning of surgery.         . The day of surgery, do not take other diabetes injectables, including Byetta (exenatide), Bydureon (exenatide ER), Victoza (liraglutide), or Trulicity (dulaglutide).  . If your CBG is greater than 220 mg/dL, you may take  of your sliding scale (correction) dose of insulin.      Reviewed and Endorsed by [facility] Patient Education Committee, August 2015  Do not wear jewelry, make-up or nail polish.  Do not wear lotions, powders,colognes, or deoderant.             Men may shave face and neck.  Do not bring valuables to the hospital.  [facility] is not responsible for any belongings or valuables.  Contacts, dentures or bridgework may not be worn into surgery.  Leave your suitcase in the car.  After surgery it may be brought to your room.  For patients admitted to the hospital, discharge time will be determined by your treatment team.  Patients discharged the day of surgery will not be allowed to drive home.    [facility]  Before surgery, you can play an important role.  Because skin is not  sterile, your skin needs to be as free of germs as possible.  You can reduce the number of germs on you skin by washing with CHG (chlorahexidine gluconate) soap before surgery.  CHG is an antiseptic cleaner which kills germs and bonds with the skin to continue killing germs even after washing.  Please DO NOT use if you have an allergy to CHG or antibacterial soaps.  If your skin becomes reddened/irritated stop using the CHG and inform your nurse when you arrive at Short Stay.  Do not shave (including legs and underarms) for at least 48 hours prior to the first CHG shower.  You may shave your face.  Please follow these instructions carefully:   1.  Shower with CHG Soap the night before surgery and the                                morning of Surgery.  2.  If you choose to wash your hair, wash your hair first as usual with your       normal shampoo.  3.  After you shampoo, rinse your hair and body thoroughly to remove the                      Shampoo.  4.  Use CHG as you would any other liquid soap.  You can apply chg directly       to the skin and wash gently with scrungie or a clean washcloth.  5.  Apply the CHG Soap to your body ONLY FROM THE NECK DOWN.        Do not use on open wounds or open sores.  Avoid contact with your eyes,       ears, mouth and genitals (private parts).  Wash genitals (private parts)       with your normal soap.  6.  Wash thoroughly, paying special attention to the area where your surgery        will be performed.  7.  Thoroughly rinse your body with warm water from the neck down.  8.  DO NOT shower/wash with your normal soap after using and rinsing off       the CHG Soap.  9.  Pat yourself dry with a clean towel.            10.  Wear clean pajamas.            11.  Place clean sheets on your bed the night of your first shower and do not        sleep with pets.  Day of Surgery  Do not apply  any lotions/deoderants the morning of surgery.  Please wear clean clothes to  the hospital/surgery center.   Please read over the following fact sheets that you were given. Pain Booklet, Coughing and Deep Breathing, MRSA Information and Surgical Site Infection Prevention

## 2016-03-02 ENCOUNTER — Encounter (HOSPITAL_COMMUNITY)
Admission: RE | Admit: 2016-03-02 | Discharge: 2016-03-02 | Disposition: A | Discharge status: Home / Self Care | Payer: Medicare Other | Source: Ambulatory Visit | Attending: Orthopedic Surgery | Admitting: Orthopedic Surgery

## 2016-03-02 ENCOUNTER — Encounter (HOSPITAL_COMMUNITY): Payer: Self-pay

## 2016-03-02 DIAGNOSIS — E1169 Type 2 diabetes mellitus with other specified complication: Secondary | ICD-10-CM | POA: Insufficient documentation

## 2016-03-02 DIAGNOSIS — Z01812 Encounter for preprocedural laboratory examination: Secondary | ICD-10-CM | POA: Insufficient documentation

## 2016-03-02 DIAGNOSIS — I2510 Atherosclerotic heart disease of native coronary artery without angina pectoris: Secondary | ICD-10-CM | POA: Insufficient documentation

## 2016-03-02 DIAGNOSIS — I10 Essential (primary) hypertension: Secondary | ICD-10-CM | POA: Insufficient documentation

## 2016-03-02 DIAGNOSIS — I739 Peripheral vascular disease, unspecified: Secondary | ICD-10-CM | POA: Diagnosis not present

## 2016-03-02 DIAGNOSIS — M5137 Other intervertebral disc degeneration, lumbosacral region: Secondary | ICD-10-CM | POA: Diagnosis not present

## 2016-03-02 DIAGNOSIS — E785 Hyperlipidemia, unspecified: Secondary | ICD-10-CM | POA: Diagnosis not present

## 2016-03-02 DIAGNOSIS — Z9861 Coronary angioplasty status: Secondary | ICD-10-CM | POA: Diagnosis not present

## 2016-03-02 HISTORY — DX: Personal history of colonic polyps: Z86.010

## 2016-03-02 HISTORY — DX: Other chronic pain: G89.29

## 2016-03-02 HISTORY — DX: Dorsalgia, unspecified: M54.9

## 2016-03-02 HISTORY — DX: Weakness: R53.1

## 2016-03-02 HISTORY — DX: Personal history of colon polyps, unspecified: Z86.0100

## 2016-03-02 HISTORY — DX: Pneumonia, unspecified organism: J18.9

## 2016-03-02 LAB — BASIC METABOLIC PANEL
ANION GAP: 10 (ref 5–15)
BUN: 21 mg/dL — ABNORMAL HIGH (ref 6–20)
CHLORIDE: 104 mmol/L (ref 101–111)
CO2: 21 mmol/L — ABNORMAL LOW (ref 22–32)
Calcium: 9.8 mg/dL (ref 8.9–10.3)
Creatinine, Ser: 1.27 mg/dL — ABNORMAL HIGH (ref 0.61–1.24)
GFR calc non Af Amer: 54 mL/min — ABNORMAL LOW (ref >60)
Glucose, Bld: 238 mg/dL — ABNORMAL HIGH (ref 65–99)
POTASSIUM: 4 mmol/L (ref 3.5–5.1)
SODIUM: 135 mmol/L (ref 135–145)

## 2016-03-02 LAB — TYPE AND SCREEN
ABO/RH(D): A POS
ANTIBODY SCREEN: NEGATIVE

## 2016-03-02 LAB — GLUCOSE, CAPILLARY: GLUCOSE-CAPILLARY: 221 mg/dL — AB (ref 65–99)

## 2016-03-02 LAB — CBC
HEMATOCRIT: 38 % — AB (ref 39.0–52.0)
HEMOGLOBIN: 12.5 g/dL — AB (ref 13.0–17.0)
MCH: 26.1 pg (ref 26.0–34.0)
MCHC: 32.9 g/dL (ref 30.0–36.0)
MCV: 79.3 fL (ref 78.0–100.0)
PLATELETS: 145 10*3/uL — AB (ref 150–400)
RBC: 4.79 MIL/uL (ref 4.22–5.81)
RDW: 14.1 % (ref 11.5–15.5)
WBC: 6.1 10*3/uL (ref 4.0–10.5)

## 2016-03-02 LAB — SURGICAL PCR SCREEN
MRSA, PCR: NEGATIVE
Staphylococcus aureus: NEGATIVE

## 2016-03-02 NOTE — Telephone Encounter (Signed)
Rx has been sent to the pharmacy electronically. ° °

## 2016-03-02 NOTE — Progress Notes (Addendum)
Cardiologist is Dr.Harding with clearance note in chart from 02/06/16.Instructions also were to hold Brilinta x 7 days  Medical Md is Dr.Xaje Hasanaj  Echo report in epic from 08/21/10  Stress test report in epic from 03/25/12  Heart cath report in epic from 04-02-12  EKG in epic from 12-05-15  CXR denies

## 2016-03-03 LAB — HEMOGLOBIN A1C
Hgb A1c MFr Bld: 7.1 % — ABNORMAL HIGH (ref 4.8–5.6)
Mean Plasma Glucose: 157 mg/dL

## 2016-03-08 ENCOUNTER — Encounter (HOSPITAL_COMMUNITY): Payer: Self-pay | Admitting: *Deleted

## 2016-03-08 ENCOUNTER — Observation Stay (HOSPITAL_COMMUNITY)
Admission: RE | Admit: 2016-03-08 | Discharge: 2016-03-11 | Disposition: A | Discharge status: Home / Self Care | Payer: Medicare Other | Source: Ambulatory Visit | Attending: Orthopedic Surgery | Admitting: Orthopedic Surgery

## 2016-03-08 ENCOUNTER — Inpatient Hospital Stay (HOSPITAL_COMMUNITY): Payer: Medicare Other | Admitting: Certified Registered Nurse Anesthetist

## 2016-03-08 ENCOUNTER — Inpatient Hospital Stay (HOSPITAL_COMMUNITY): Payer: Medicare Other

## 2016-03-08 ENCOUNTER — Encounter (HOSPITAL_COMMUNITY)
Admission: RE | Disposition: A | Discharge status: Home / Self Care | Payer: Self-pay | Source: Ambulatory Visit | Attending: Orthopedic Surgery

## 2016-03-08 DIAGNOSIS — I10 Essential (primary) hypertension: Secondary | ICD-10-CM | POA: Insufficient documentation

## 2016-03-08 DIAGNOSIS — R531 Weakness: Secondary | ICD-10-CM | POA: Insufficient documentation

## 2016-03-08 DIAGNOSIS — Z86010 Personal history of colonic polyps: Secondary | ICD-10-CM | POA: Insufficient documentation

## 2016-03-08 DIAGNOSIS — Z87891 Personal history of nicotine dependence: Secondary | ICD-10-CM | POA: Insufficient documentation

## 2016-03-08 DIAGNOSIS — Z8619 Personal history of other infectious and parasitic diseases: Secondary | ICD-10-CM | POA: Insufficient documentation

## 2016-03-08 DIAGNOSIS — I2510 Atherosclerotic heart disease of native coronary artery without angina pectoris: Secondary | ICD-10-CM | POA: Insufficient documentation

## 2016-03-08 DIAGNOSIS — I252 Old myocardial infarction: Secondary | ICD-10-CM | POA: Insufficient documentation

## 2016-03-08 DIAGNOSIS — M419 Scoliosis, unspecified: Secondary | ICD-10-CM | POA: Diagnosis not present

## 2016-03-08 DIAGNOSIS — G5601 Carpal tunnel syndrome, right upper limb: Secondary | ICD-10-CM | POA: Diagnosis not present

## 2016-03-08 DIAGNOSIS — M5136 Other intervertebral disc degeneration, lumbar region: Secondary | ICD-10-CM | POA: Diagnosis not present

## 2016-03-08 DIAGNOSIS — Z7984 Long term (current) use of oral hypoglycemic drugs: Secondary | ICD-10-CM | POA: Insufficient documentation

## 2016-03-08 DIAGNOSIS — E7800 Pure hypercholesterolemia, unspecified: Secondary | ICD-10-CM | POA: Insufficient documentation

## 2016-03-08 DIAGNOSIS — M48061 Spinal stenosis, lumbar region without neurogenic claudication: Principal | ICD-10-CM | POA: Insufficient documentation

## 2016-03-08 DIAGNOSIS — K5900 Constipation, unspecified: Secondary | ICD-10-CM | POA: Insufficient documentation

## 2016-03-08 DIAGNOSIS — E1151 Type 2 diabetes mellitus with diabetic peripheral angiopathy without gangrene: Secondary | ICD-10-CM | POA: Diagnosis not present

## 2016-03-08 DIAGNOSIS — K219 Gastro-esophageal reflux disease without esophagitis: Secondary | ICD-10-CM | POA: Insufficient documentation

## 2016-03-08 DIAGNOSIS — Z419 Encounter for procedure for purposes other than remedying health state, unspecified: Secondary | ICD-10-CM

## 2016-03-08 DIAGNOSIS — Z951 Presence of aortocoronary bypass graft: Secondary | ICD-10-CM | POA: Diagnosis not present

## 2016-03-08 DIAGNOSIS — G8929 Other chronic pain: Secondary | ICD-10-CM | POA: Diagnosis not present

## 2016-03-08 DIAGNOSIS — M549 Dorsalgia, unspecified: Secondary | ICD-10-CM | POA: Diagnosis present

## 2016-03-08 HISTORY — PX: DECOMPRESSIVE LUMBAR LAMINECTOMY LEVEL 1: SHX5791

## 2016-03-08 LAB — GLUCOSE, CAPILLARY
GLUCOSE-CAPILLARY: 198 mg/dL — AB (ref 65–99)
Glucose-Capillary: 139 mg/dL — ABNORMAL HIGH (ref 65–99)
Glucose-Capillary: 115 mg/dL — ABNORMAL HIGH (ref 65–99)

## 2016-03-08 SURGERY — DECOMPRESSIVE LUMBAR LAMINECTOMY LEVEL 1
Anesthesia: General | Site: Spine Lumbar

## 2016-03-08 MED ORDER — FENTANYL CITRATE (PF) 100 MCG/2ML IJ SOLN
25.0000 ug | INTRAMUSCULAR | Status: DC | PRN
  Administered 2016-03-08: 50 ug via INTRAVENOUS

## 2016-03-08 MED ORDER — MIDAZOLAM HCL 2 MG/2ML IJ SOLN
INTRAMUSCULAR | Status: AC
  Filled 2016-03-08: qty 2

## 2016-03-08 MED ORDER — OXYCODONE-ACETAMINOPHEN 5-325 MG PO TABS: 1.0000 | ORAL_TABLET | ORAL | Status: DC | PRN

## 2016-03-08 MED ORDER — PROPOFOL 10 MG/ML IV BOLUS
INTRAVENOUS | Status: DC | PRN
  Administered 2016-03-08: 30 mg via INTRAVENOUS
  Administered 2016-03-08: 170 mg via INTRAVENOUS

## 2016-03-08 MED ORDER — 0.9 % SODIUM CHLORIDE (POUR BTL) OPTIME
TOPICAL | Status: DC | PRN
  Administered 2016-03-08: 1000 mL

## 2016-03-08 MED ORDER — METHOCARBAMOL 1000 MG/10ML IJ SOLN
500.0000 mg | Freq: Four times a day (QID) | INTRAVENOUS | Status: DC | PRN
  Filled 2016-03-08: qty 5

## 2016-03-08 MED ORDER — PROPOFOL 1000 MG/100ML IV EMUL
INTRAVENOUS | Status: AC
  Filled 2016-03-08: qty 100

## 2016-03-08 MED ORDER — ACETAMINOPHEN 10 MG/ML IV SOLN
INTRAVENOUS | Status: AC
  Filled 2016-03-08: qty 100

## 2016-03-08 MED ORDER — SUGAMMADEX SODIUM 200 MG/2ML IV SOLN
INTRAVENOUS | Status: AC
  Filled 2016-03-08: qty 2

## 2016-03-08 MED ORDER — HYDROCHLOROTHIAZIDE 25 MG PO TABS
25.0000 mg | ORAL_TABLET | Freq: Every morning | ORAL | Status: DC
  Administered 2016-03-09 – 2016-03-11 (×3): 25 mg via ORAL
  Filled 2016-03-08 (×3): qty 1

## 2016-03-08 MED ORDER — SODIUM CHLORIDE 0.9% FLUSH: 3.0000 mL | INTRAVENOUS | Status: DC | PRN

## 2016-03-08 MED ORDER — INSULIN ASPART 100 UNIT/ML ~~LOC~~ SOLN: 0.0000 [IU] | Freq: Every day | SUBCUTANEOUS | Status: DC

## 2016-03-08 MED ORDER — ROCURONIUM BROMIDE 100 MG/10ML IV SOLN
INTRAVENOUS | Status: DC | PRN
  Administered 2016-03-08: 20 mg via INTRAVENOUS
  Administered 2016-03-08: 50 mg via INTRAVENOUS
  Administered 2016-03-08 (×2): 10 mg via INTRAVENOUS

## 2016-03-08 MED ORDER — ARTIFICIAL TEARS OP OINT
TOPICAL_OINTMENT | OPHTHALMIC | Status: DC | PRN
  Administered 2016-03-08: 1 via OPHTHALMIC

## 2016-03-08 MED ORDER — ONDANSETRON HCL 4 MG PO TABS: 4.0000 mg | ORAL_TABLET | Freq: Three times a day (TID) | ORAL | 0 refills | Status: DC | PRN

## 2016-03-08 MED ORDER — IRBESARTAN 300 MG PO TABS
300.0000 mg | ORAL_TABLET | Freq: Every day | ORAL | Status: DC
  Administered 2016-03-09 – 2016-03-11 (×3): 300 mg via ORAL
  Filled 2016-03-08 (×3): qty 1

## 2016-03-08 MED ORDER — PHENYLEPHRINE 40 MCG/ML (10ML) SYRINGE FOR IV PUSH (FOR BLOOD PRESSURE SUPPORT)
PREFILLED_SYRINGE | INTRAVENOUS | Status: AC
  Filled 2016-03-08: qty 10

## 2016-03-08 MED ORDER — ONDANSETRON HCL 4 MG/2ML IJ SOLN: 4.0000 mg | Freq: Once | INTRAMUSCULAR | Status: DC | PRN

## 2016-03-08 MED ORDER — THROMBIN 20000 UNITS EX SOLR
CUTANEOUS | Status: AC
  Filled 2016-03-08: qty 20000

## 2016-03-08 MED ORDER — METHOCARBAMOL 500 MG PO TABS: 500.0000 mg | ORAL_TABLET | Freq: Three times a day (TID) | ORAL | 0 refills | Status: DC | PRN

## 2016-03-08 MED ORDER — NITROGLYCERIN 0.4 MG SL SUBL: 0.4000 mg | SUBLINGUAL_TABLET | SUBLINGUAL | Status: DC | PRN

## 2016-03-08 MED ORDER — KETAMINE HCL 10 MG/ML IJ SOLN
INTRAMUSCULAR | Status: DC | PRN
  Administered 2016-03-08: 30 mg via INTRAVENOUS

## 2016-03-08 MED ORDER — METHOCARBAMOL 500 MG PO TABS
500.0000 mg | ORAL_TABLET | Freq: Four times a day (QID) | ORAL | Status: DC | PRN
Start: 2016-03-08 — End: 2016-03-11
  Administered 2016-03-09 – 2016-03-11 (×3): 500 mg via ORAL
  Filled 2016-03-08 (×3): qty 1

## 2016-03-08 MED ORDER — FENTANYL CITRATE (PF) 100 MCG/2ML IJ SOLN
INTRAMUSCULAR | Status: AC
  Administered 2016-03-08: 50 ug via INTRAVENOUS
  Filled 2016-03-08: qty 2

## 2016-03-08 MED ORDER — METFORMIN HCL 500 MG PO TABS
500.0000 mg | ORAL_TABLET | Freq: Every day | ORAL | Status: DC
  Administered 2016-03-09 – 2016-03-11 (×3): 500 mg via ORAL
  Filled 2016-03-08 (×3): qty 1

## 2016-03-08 MED ORDER — BUPIVACAINE-EPINEPHRINE 0.5% -1:200000 IJ SOLN
INTRAMUSCULAR | Status: DC | PRN
  Administered 2016-03-08: 10 mL

## 2016-03-08 MED ORDER — OXYCODONE-ACETAMINOPHEN 10-325 MG PO TABS: 1.0000 | ORAL_TABLET | ORAL | 0 refills | Status: DC | PRN

## 2016-03-08 MED ORDER — MENTHOL 3 MG MT LOZG: 1.0000 | LOZENGE | OROMUCOSAL | Status: DC | PRN

## 2016-03-08 MED ORDER — OXYCODONE HCL 5 MG PO TABS: 5.0000 mg | ORAL_TABLET | Freq: Once | ORAL | Status: DC | PRN

## 2016-03-08 MED ORDER — THROMBIN 20000 UNITS EX SOLR
CUTANEOUS | Status: DC | PRN
  Administered 2016-03-08: 20 mL via TOPICAL

## 2016-03-08 MED ORDER — SUGAMMADEX SODIUM 200 MG/2ML IV SOLN
INTRAVENOUS | Status: DC | PRN
  Administered 2016-03-08: 200 mg via INTRAVENOUS

## 2016-03-08 MED ORDER — PHENOL 1.4 % MT LIQD: 1.0000 | OROMUCOSAL | Status: DC | PRN

## 2016-03-08 MED ORDER — SODIUM CHLORIDE 0.9 % IV SOLN: 250.0000 mL | INTRAVENOUS | Status: DC

## 2016-03-08 MED ORDER — OXYCODONE HCL 5 MG PO TABS
5.0000 mg | ORAL_TABLET | ORAL | Status: DC | PRN
  Administered 2016-03-08 – 2016-03-11 (×5): 10 mg via ORAL
  Filled 2016-03-08 (×5): qty 2

## 2016-03-08 MED ORDER — ONDANSETRON HCL 4 MG/2ML IJ SOLN
4.0000 mg | INTRAMUSCULAR | Status: DC | PRN
  Administered 2016-03-10: 4 mg via INTRAVENOUS
  Filled 2016-03-08: qty 2

## 2016-03-08 MED ORDER — ONDANSETRON HCL 4 MG/2ML IJ SOLN
INTRAMUSCULAR | Status: DC | PRN
  Administered 2016-03-08: 4 mg via INTRAVENOUS

## 2016-03-08 MED ORDER — ONDANSETRON HCL 4 MG/2ML IJ SOLN
INTRAMUSCULAR | Status: AC
  Filled 2016-03-08: qty 2

## 2016-03-08 MED ORDER — ARTIFICIAL TEARS OP OINT
TOPICAL_OINTMENT | OPHTHALMIC | Status: AC
  Filled 2016-03-08: qty 3.5

## 2016-03-08 MED ORDER — HEMOSTATIC AGENTS (NO CHARGE) OPTIME
TOPICAL | Status: DC | PRN
  Administered 2016-03-08: 1 via TOPICAL

## 2016-03-08 MED ORDER — FENTANYL CITRATE (PF) 100 MCG/2ML IJ SOLN
INTRAMUSCULAR | Status: AC
  Filled 2016-03-08: qty 2

## 2016-03-08 MED ORDER — INSULIN ASPART 100 UNIT/ML ~~LOC~~ SOLN
0.0000 [IU] | Freq: Three times a day (TID) | SUBCUTANEOUS | Status: DC
Start: 2016-03-09 — End: 2016-03-11
  Administered 2016-03-10: 3 [IU] via SUBCUTANEOUS
  Administered 2016-03-10 – 2016-03-11 (×3): 2 [IU] via SUBCUTANEOUS

## 2016-03-08 MED ORDER — OXYCODONE HCL 5 MG/5ML PO SOLN: 5.0000 mg | Freq: Once | ORAL | Status: DC | PRN

## 2016-03-08 MED ORDER — PHENYLEPHRINE HCL 10 MG/ML IJ SOLN
INTRAMUSCULAR | Status: DC | PRN
  Administered 2016-03-08: 100 ug/min via INTRAVENOUS

## 2016-03-08 MED ORDER — ACETAMINOPHEN 325 MG PO TABS: 650.0000 mg | ORAL_TABLET | ORAL | Status: DC | PRN

## 2016-03-08 MED ORDER — FENTANYL CITRATE (PF) 100 MCG/2ML IJ SOLN
INTRAMUSCULAR | Status: DC | PRN
  Administered 2016-03-08 (×2): 100 ug via INTRAVENOUS

## 2016-03-08 MED ORDER — LIDOCAINE 2% (20 MG/ML) 5 ML SYRINGE
INTRAMUSCULAR | Status: AC
  Filled 2016-03-08: qty 5

## 2016-03-08 MED ORDER — POLYETHYLENE GLYCOL 3350 17 G PO PACK
17.0000 g | PACK | Freq: Every day | ORAL | Status: DC | PRN
  Administered 2016-03-10: 17 g via ORAL
  Filled 2016-03-08 (×2): qty 1

## 2016-03-08 MED ORDER — PROPOFOL 10 MG/ML IV BOLUS
INTRAVENOUS | Status: AC
  Filled 2016-03-08: qty 20

## 2016-03-08 MED ORDER — GLYCOPYRROLATE 0.2 MG/ML IJ SOLN
INTRAMUSCULAR | Status: DC | PRN
  Administered 2016-03-08: 0.2 mg via INTRAVENOUS

## 2016-03-08 MED ORDER — CEFAZOLIN SODIUM-DEXTROSE 2-4 GM/100ML-% IV SOLN
2.0000 g | Freq: Three times a day (TID) | INTRAVENOUS | Status: AC
  Administered 2016-03-08 – 2016-03-09 (×2): 2 g via INTRAVENOUS
  Filled 2016-03-08 (×2): qty 100

## 2016-03-08 MED ORDER — CARVEDILOL 12.5 MG PO TABS
25.0000 mg | ORAL_TABLET | Freq: Two times a day (BID) | ORAL | Status: DC
  Administered 2016-03-08 – 2016-03-11 (×6): 25 mg via ORAL
  Filled 2016-03-08: qty 4
  Filled 2016-03-08 (×3): qty 2
  Filled 2016-03-08 (×2): qty 8

## 2016-03-08 MED ORDER — LACTATED RINGERS IV SOLN: INTRAVENOUS | Status: DC

## 2016-03-08 MED ORDER — ACETAMINOPHEN 10 MG/ML IV SOLN
1000.0000 mg | Freq: Four times a day (QID) | INTRAVENOUS | Status: AC
  Administered 2016-03-08 – 2016-03-09 (×3): 1000 mg via INTRAVENOUS
  Filled 2016-03-08 (×3): qty 100

## 2016-03-08 MED ORDER — ACETAMINOPHEN 650 MG RE SUPP: 650.0000 mg | RECTAL | Status: DC | PRN

## 2016-03-08 MED ORDER — LACTATED RINGERS IV SOLN
INTRAVENOUS | Status: DC
  Administered 2016-03-08 (×3): via INTRAVENOUS

## 2016-03-08 MED ORDER — LIDOCAINE HCL (CARDIAC) 20 MG/ML IV SOLN
INTRAVENOUS | Status: DC | PRN
  Administered 2016-03-08: 60 mg via INTRATRACHEAL

## 2016-03-08 MED ORDER — MIDAZOLAM HCL 2 MG/2ML IJ SOLN
INTRAMUSCULAR | Status: DC | PRN
  Administered 2016-03-08 (×2): 1 mg via INTRAVENOUS

## 2016-03-08 MED ORDER — KETAMINE HCL-SODIUM CHLORIDE 100-0.9 MG/10ML-% IV SOSY
PREFILLED_SYRINGE | INTRAVENOUS | Status: AC
  Filled 2016-03-08: qty 10

## 2016-03-08 MED ORDER — MORPHINE SULFATE (PF) 4 MG/ML IV SOLN: 2.0000 mg | INTRAVENOUS | Status: DC | PRN

## 2016-03-08 MED ORDER — ROCURONIUM BROMIDE 50 MG/5ML IV SOSY
PREFILLED_SYRINGE | INTRAVENOUS | Status: AC
  Filled 2016-03-08: qty 5

## 2016-03-08 MED ORDER — CEFAZOLIN SODIUM-DEXTROSE 2-4 GM/100ML-% IV SOLN
2.0000 g | INTRAVENOUS | Status: AC
  Administered 2016-03-08: 2 g via INTRAVENOUS
  Filled 2016-03-08: qty 100

## 2016-03-08 MED ORDER — SODIUM CHLORIDE 0.9% FLUSH
3.0000 mL | Freq: Two times a day (BID) | INTRAVENOUS | Status: DC
  Administered 2016-03-08 – 2016-03-10 (×4): 3 mL via INTRAVENOUS

## 2016-03-08 SURGICAL SUPPLY — 54 items
BONE VIVIGEN FORMABLE 5.4CC (Bone Implant) ×3 IMPLANT
BUR EGG ELITE 4.0 (BURR) ×2 IMPLANT
BUR EGG ELITE 4.0MM (BURR) ×1
CLOSURE STERI-STRIP 1/2X4 (GAUZE/BANDAGES/DRESSINGS) ×1
CLSR STERI-STRIP ANTIMIC 1/2X4 (GAUZE/BANDAGES/DRESSINGS) ×2 IMPLANT
CORDS BIPOLAR (ELECTRODE) ×3 IMPLANT
COVER SURGICAL LIGHT HANDLE (MISCELLANEOUS) ×3 IMPLANT
DRAPE POUCH INSTRU U-SHP 10X18 (DRAPES) ×3 IMPLANT
DRAPE SURG 17X11 SM STRL (DRAPES) ×3 IMPLANT
DRAPE U-SHAPE 47X51 STRL (DRAPES) ×3 IMPLANT
DRSG AQUACEL AG ADV 3.5X 4 (GAUZE/BANDAGES/DRESSINGS) IMPLANT
DRSG AQUACEL AG ADV 3.5X 6 (GAUZE/BANDAGES/DRESSINGS) ×3 IMPLANT
DURAPREP 26ML APPLICATOR (WOUND CARE) ×3 IMPLANT
ELECT BLADE 4.0 EZ CLEAN MEGAD (MISCELLANEOUS) ×3
ELECT PENCIL ROCKER SW 15FT (MISCELLANEOUS) ×3 IMPLANT
ELECT REM PT RETURN 9FT ADLT (ELECTROSURGICAL) ×3
ELECTRODE BLDE 4.0 EZ CLN MEGD (MISCELLANEOUS) ×1 IMPLANT
ELECTRODE REM PT RTRN 9FT ADLT (ELECTROSURGICAL) ×1 IMPLANT
GLOVE BIO SURGEON STRL SZ 6.5 (GLOVE) ×2 IMPLANT
GLOVE BIO SURGEONS STRL SZ 6.5 (GLOVE) ×1
GLOVE BIOGEL PI IND STRL 6.5 (GLOVE) ×1 IMPLANT
GLOVE BIOGEL PI IND STRL 8.5 (GLOVE) ×1 IMPLANT
GLOVE BIOGEL PI INDICATOR 6.5 (GLOVE) ×2
GLOVE BIOGEL PI INDICATOR 8.5 (GLOVE) ×2
GLOVE SS BIOGEL STRL SZ 8.5 (GLOVE) ×1 IMPLANT
GLOVE SUPERSENSE BIOGEL SZ 8.5 (GLOVE) ×2
GOWN STRL REUS W/ TWL LRG LVL3 (GOWN DISPOSABLE) ×1 IMPLANT
GOWN STRL REUS W/TWL 2XL LVL3 (GOWN DISPOSABLE) ×3 IMPLANT
GOWN STRL REUS W/TWL LRG LVL3 (GOWN DISPOSABLE) ×2
KIT BASIN OR (CUSTOM PROCEDURE TRAY) ×3 IMPLANT
NEEDLE 22X1 1/2 (OR ONLY) (NEEDLE) ×3 IMPLANT
NEEDLE SPNL 18GX3.5 QUINCKE PK (NEEDLE) ×6 IMPLANT
NS IRRIG 1000ML POUR BTL (IV SOLUTION) ×3 IMPLANT
PACK LAMINECTOMY ORTHO (CUSTOM PROCEDURE TRAY) ×3 IMPLANT
PACK UNIVERSAL I (CUSTOM PROCEDURE TRAY) ×3 IMPLANT
PATTIES SURGICAL .5 X.5 (GAUZE/BANDAGES/DRESSINGS) IMPLANT
PATTIES SURGICAL .5 X1 (DISPOSABLE) ×6 IMPLANT
SPONGE LAP 4X18 X RAY DECT (DISPOSABLE) ×12 IMPLANT
SPONGE SURGIFOAM ABS GEL 100 (HEMOSTASIS) ×3 IMPLANT
SURGIFLO W/THROMBIN 8M KIT (HEMOSTASIS) ×6 IMPLANT
SUT BONE WAX W31G (SUTURE) ×3 IMPLANT
SUT MON AB 3-0 SH 27 (SUTURE) ×2
SUT MON AB 3-0 SH27 (SUTURE) ×1 IMPLANT
SUT STRATAFIX 1PDS 45CM VIOLET (SUTURE) ×3 IMPLANT
SUT VIC AB 1 CT1 27 (SUTURE) ×2
SUT VIC AB 1 CT1 27XBRD ANTBC (SUTURE) ×1 IMPLANT
SUT VIC AB 2-0 CT1 18 (SUTURE) ×6 IMPLANT
SUT VICRYL 0 UR6 27IN ABS (SUTURE) IMPLANT
SYR BULB IRRIGATION 50ML (SYRINGE) ×3 IMPLANT
SYR CONTROL 10ML LL (SYRINGE) ×3 IMPLANT
TOWEL OR 17X26 10 PK STRL BLUE (TOWEL DISPOSABLE) ×6 IMPLANT
TRAY FOLEY CATH 16FRSI W/METER (SET/KITS/TRAYS/PACK) ×3 IMPLANT
WATER STERILE IRR 1000ML POUR (IV SOLUTION) IMPLANT
YANKAUER SUCT BULB TIP NO VENT (SUCTIONS) ×3 IMPLANT

## 2016-03-08 NOTE — Transfer of Care (Signed)
Immediate Anesthesia Transfer of Care Note  Patient: [name]  Procedure(s) Performed: Procedure(s): Lumbar 3-5 decompression with in situ fusion (N/A)  Patient Location: PACU  Anesthesia Type:General  Level of Consciousness: awake, alert  and oriented  Airway & Oxygen Therapy: Patient Spontanous Breathing and Patient connected to nasal cannula oxygen  Post-op Assessment: Report given to RN and Post -op Vital signs reviewed and stable  Post vital signs: Reviewed and stable  Last Vitals:  Vitals:   03/08/16 1042  BP: (!) 183/80  Pulse: 72  Resp: 20  Temp: 36.7 C    Last Pain:  Vitals:   03/08/16 1042  TempSrc: Oral         Complications: No apparent anesthesia complications

## 2016-03-08 NOTE — Brief Op Note (Signed)
03/08/2016  3:36 PM  PATIENT:  [name]  76 y.o. male  PRE-OPERATIVE DIAGNOSIS:  lumbar spinal stenosis with scoliosis  POST-OPERATIVE DIAGNOSIS:  lumbar spinal stenosis with scoliosis  PROCEDURE:  Procedure(s): Lumbar 3-5 decompression with in situ fusion (N/A)  SURGEON:  Surgeon(s) and Role:    * Melina Schools, MD - Primary  PHYSICIAN ASSISTANT:   ASSISTANTS: Carmen Mayon   ANESTHESIA:   general  EBL:  Total I/O In: 1000 [I.V.:1000] Out: 750 [Urine:450; Blood:300]  BLOOD ADMINISTERED:none  DRAINS: none   LOCAL MEDICATIONS USED:  MARCAINE     SPECIMEN:  No Specimen  DISPOSITION OF SPECIMEN:  N/A  COUNTS:  YES  TOURNIQUET:  * No tourniquets in log *  DICTATION: .Other Dictation: Dictation Number [id number]  PLAN OF CARE: Admit for overnight observation  PATIENT DISPOSITION:  PACU - hemodynamically stable.

## 2016-03-08 NOTE — Anesthesia Preprocedure Evaluation (Signed)
Anesthesia Evaluation  Patient identified by MRN, date of birth, ID band Patient awake    Reviewed: Allergy & Precautions, NPO status , Patient's Chart, lab work & pertinent test results  Airway Mallampati: II  TM Distance: >3 FB Neck ROM: Full    Dental  (+) Teeth Intact, Dental Advisory Given   Pulmonary former smoker,    breath sounds clear to auscultation       Cardiovascular hypertension,  Rhythm:Regular Rate:Normal     Neuro/Psych    GI/Hepatic   Endo/Other  diabetes  Renal/GU      Musculoskeletal   Abdominal   Peds  Hematology   Anesthesia Other Findings   Reproductive/Obstetrics                             Anesthesia Physical Anesthesia Plan  ASA: III  Anesthesia Plan: General   Post-op Pain Management:    Induction: Intravenous  Airway Management Planned: Oral ETT  Additional Equipment:   Intra-op Plan:   Post-operative Plan: Extubation in OR  Informed Consent: I have reviewed the patients History and Physical, chart, labs and discussed the procedure including the risks, benefits and alternatives for the proposed anesthesia with the patient or authorized representative who has indicated his/her understanding and acceptance.   Dental advisory given  Plan Discussed with: CRNA and Anesthesiologist  Anesthesia Plan Comments:         Anesthesia Quick Evaluation  

## 2016-03-08 NOTE — H&P (Signed)
History of Present Illness The patient is a 76 year old male who comes in today for a preoperative History and Physical. The patient is scheduled for a L3-5 decompression with insitu fusion to be performed by Dr. Duane Lope D. Rolena Infante, MD at [facility] on 03-08-16 . Please see the hospital record for complete dictated history and physical. Pt had bipass surgery in 2014. Pt has DM2. reports it is well controlled.  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 mother and father First Degree Relatives   Social History  Tobacco use  Former smoker. former smoker; smoke(d) 1/2 pack(s) per day Exercise  Exercises rarely; does other Drug/Alcohol Rehab (Currently)  no Current work status  retired Alcohol use  former drinker Illicit drug use  no Copy of Drug/Alcohol Rehab (Previously)  no Children  2 Living situation  live with spouse Tobacco / smoke exposure  no Pain Contract  no Number of flights of stairs before winded  greater than 5 Marital status  married  Medication History  Telmisartan (80MG  Tablet, Oral) Active. Carvedilol (25MG  Tablet, Oral) Active. Brilinta (90MG  Tablet, Oral) Active. Fish Oil Active. Carvedilol (12.5MG  Tablet, Oral) Active. (qd) Losartan Potassium (100MG  Tablet, Oral) Active. (qd) Hydrochlorothiazide (25MG  Tablet, Oral) Active. (qd) MetFORMIN HCl (500MG  Tablet, Oral) Active. (bid) Simvastatin (40MG  Tablet, Oral) Active.  (qd) Medications Reconciled  Past Surgical History Coronary Artery Bypass Graft  4 or more vessels Inguinal Hernia Repair  laparoscopic: bilateral  Other Problems High blood pressure  Hypercholesterolemia  Myocardial infarction  Coronary artery disease   Vitals  03/02/2016 11:28 AM Weight: 228 lb Height: 71.75in Body Surface Area: 2.25 m Body Mass Index: 31.14 kg/m  Pulse: 70 (Regular)  BP: 117/68 (Sitting, Left Arm, Standard) W/SHOES SMT  General General Appearance-Not in acute distress. Orientation-Oriented X3. Build & Nutrition-Well nourished and Well developed.  Integumentary General Characteristics Surgical Scars - no surgical scar evidence of previous lumbar surgery. Lumbar Spine-Skin examination of the lumbar spine is without deformity, skin lesions, lacerations or abrasions.  Chest and Lung Exam Auscultation Breath sounds - Normal and Clear.  Cardiovascular Auscultation Rhythm - Regular rate and rhythm.  Abdomen Palpation/Percussion Palpation and Percussion of the abdomen reveal - Soft, Non Tender and No Rebound tenderness.  Peripheral Vascular Lower Extremity Palpation - Posterior tibial pulse - Bilateral - 1+. Dorsalis pedis pulse - Bilateral - 1+.  Neurologic Sensation Lower Extremity - Bilateral - sensation is diminished in the lower extremity. Reflexes Patellar Reflex - Bilateral - 2+. Achilles Reflex - Bilateral - 2+. Clonus - Bilateral - clonus not present. Hoffman's Sign - Bilateral - Hoffman's sign not present. Testing Seated Straight Leg Raise - Bilateral - Seated straight leg raise negative.  Musculoskeletal Spine/Ribs/Pelvis  Lumbosacral Spine: Inspection and Palpation - Tenderness - left lumbar paraspinals tender to palpation and right lumbar paraspinals tender to palpation. Strength and Tone: Strength - Hip Flexion - Bilateral - 5/5. Knee Extension - Bilateral - 5/5. Knee Flexion - Bilateral - 5/5. Ankle  Dorsiflexion - Bilateral - 5/5. Ankle Plantarflexion - Bilateral - 5/5. Heel walk -  Bilateral - able to heel walk with moderate difficulty. Toe Walk - Bilateral - able to walk on toes with moderate difficulty. Heel-Toe Walk - Bilateral - able to heel-toe walk without difficulty. ROM - Flexion - moderately decreased range of motion. Extension - moderately decreased range of motion and painful. Left Lateral Bending - moderately decreased range of motion and painful. Right Lateral Bending - moderately decreased range of motion and painful. Right Rotation - moderately decreased range of motion and painful. Left Rotation - moderately decreased range of motion and painful. Pain - neither flexion or extension is more painful than the other. Lumbosacral Spine - Waddell's Signs - no Waddell's signs present. Lower Extremity Range of Motion - No true hip, knee or ankle pain with range of motion. Gait and Station - [employer] - no assistive devices.  The patient had on his imaging the MRI from 01/26/2016 shows severe stenosis at L3-L4, 3 mm retrolisthesis. He also has moderate to severe foraminal and central stenosis at L4-L5, moderate disease at 5-1. He has moderate disease at 2-3. He also has varying degrees of degenerative disease.   Assessment & Plan   Posterior Lumbar Decompression/disectomy: Risks of surgery include infection, bleeding, nerve damage, death, stroke, paralysis, failure to heal, need for further surgery, ongoing or worse pain, need for further surgery, CSF leak, loss of bowel or bladder, and recurrent disc herniation or Stenosis which would necessitate need for further surgery.  Goal Of Surgery: Discussed that goal of surgery is to reduce pain and improve function and quality of life. Patient is aware that despite all appropriate treatment that there pain and function could be the same, worse, or different.  At this point in time, his plain x-rays also show a degenerative scoliosis. At  this point, he has had injections before in 2016, which provided no relief. He did do physical therapy before and that provided no significant relief. Overall, the quality of his life is quite poor. His primary complaint is the spinal stenosis with neurogenic claudication. At this point, I think moving forward with a lumbar decompression at 3-4 and 4-5 is reasonable. My concern is worsening of the degenerative scoliosis after the decompression. To minimize this, I think I would move forward with just a straightforward insitu fusion. Neither the patient nor myself are eager about doing a multilevel fusion to address the scoliosis. His principal problem is the neurogenic claudication and I think both the patient and I have a reasonable expectation in terms of the results. The goal is to help to minimize the neurogenic claudication symptoms to improve his overall function. We are both aware that it does not get address the scoliosis. He will always have some degree of back discomfort, but my plan is to help reduce this neuropathic pain. The risks were explained to the patient and his wife. All of their questions were addressed.

## 2016-03-08 NOTE — Progress Notes (Signed)
Orthopedic Tech Progress Note Patient Details:  [name] April 17, 1940 [id number] Patient already has brace Patient ID: [name], male   DOB: 1941-01-03, 76 y.o.   MRN: [id number]   [name] 03/08/2016, 5:15 PM

## 2016-03-08 NOTE — Anesthesia Procedure Notes (Signed)
Procedure Name: Intubation Date/Time: 03/08/2016 1:16 PM Performed by: Linna Caprice, DAVID Pre-anesthesia Checklist: Patient identified, Emergency Drugs available, Suction available and Patient being monitored Patient Re-evaluated:Patient Re-evaluated prior to inductionOxygen Delivery Method: Circle system utilized Preoxygenation: Pre-oxygenation with 100% oxygen Intubation Type: IV induction Ventilation: Mask ventilation without difficulty Laryngoscope Size: Mac and 4 Grade View: Grade II Tube type: Oral Tube size: 7.0 mm Number of attempts: 1 Airway Equipment and Method: Stylet Placement Confirmation: ETT inserted through vocal cords under direct vision,  positive ETCO2 and breath sounds checked- equal and bilateral Secured at: 23 cm Tube secured with: Tape Dental Injury: Teeth and Oropharynx as per pre-operative assessment

## 2016-03-08 NOTE — Anesthesia Postprocedure Evaluation (Addendum)
Anesthesia Post Note  Patient: [name]  Procedure(s) Performed: Procedure(s) (LRB): Lumbar 3-5 decompression with in situ fusion (N/A)  Patient location during evaluation: PACU Anesthesia Type: General Level of consciousness: awake and alert Pain management: pain level controlled Vital Signs Assessment: post-procedure vital signs reviewed and stable Respiratory status: spontaneous breathing, nonlabored ventilation, respiratory function stable and patient connected to nasal cannula oxygen Cardiovascular status: blood pressure returned to baseline and stable Postop Assessment: no signs of nausea or vomiting Anesthetic complications: no       Last Vitals:  Vitals:   03/08/16 1642 03/08/16 1645  BP:    Pulse: 65 67  Resp: 12 10  Temp:  36.6 C    Last Pain:  Vitals:   03/08/16 1615  TempSrc:   PainSc: 7     LLE Motor Response: Purposeful movement;Responds to commands (03/08/16 1645) LLE Sensation: Full sensation (03/08/16 1645) RLE Motor Response: Purposeful movement;Responds to commands (03/08/16 1645) RLE Sensation: Full sensation (03/08/16 1645)      ROSE,[name]

## 2016-03-09 ENCOUNTER — Encounter (HOSPITAL_COMMUNITY): Payer: Self-pay | Admitting: Orthopedic Surgery

## 2016-03-09 DIAGNOSIS — M48061 Spinal stenosis, lumbar region without neurogenic claudication: Secondary | ICD-10-CM | POA: Diagnosis not present

## 2016-03-09 LAB — GLUCOSE, CAPILLARY
Glucose-Capillary: 181 mg/dL — ABNORMAL HIGH (ref 65–99)
Glucose-Capillary: 185 mg/dL — ABNORMAL HIGH (ref 65–99)
Glucose-Capillary: 144 mg/dL — ABNORMAL HIGH (ref 65–99)
Glucose-Capillary: 169 mg/dL — ABNORMAL HIGH (ref 65–99)

## 2016-03-09 MED ORDER — MAGNESIUM CITRATE PO SOLN
1.0000 | Freq: Once | ORAL | Status: AC
  Administered 2016-03-09: 1 via ORAL
  Filled 2016-03-09: qty 296

## 2016-03-09 MED ORDER — ONDANSETRON HCL 4 MG PO TABS
4.0000 mg | ORAL_TABLET | Freq: Three times a day (TID) | ORAL | Status: DC | PRN
  Administered 2016-03-09 – 2016-03-10 (×2): 4 mg via ORAL
  Filled 2016-03-09 (×2): qty 1

## 2016-03-09 NOTE — Evaluation (Signed)
Physical Therapy Evaluation Patient Details Name: [name] MRN: [id number] DOB: August 12, 1940 Today's Date: 03/09/2016   History of Present Illness  76 yo male s/p L 3-5 decompression fusion  Clinical Impression  Patient evaluated by Physical Therapy with no further acute PT needs identified. All education has been completed and the patient has no further questions. At the time of PT eval pt was able to perform transfers and ambulation with gross supervision for safety. See below for any follow-up Physical Therapy or equipment needs. PT is signing off. Thank you for this referral.     Follow Up Recommendations Outpatient PT    Equipment Recommendations  3in1 (PT)    Recommendations for Other Services       Precautions / Restrictions Precautions Precautions: Back Precaution Comments: handout reviewed. Pt able to recall 3 out 3 by the end of session Required Braces or Orthoses: Spinal Brace Spinal Brace: Lumbar corset;Applied in sitting position Restrictions Weight Bearing Restrictions: No      Mobility  Bed Mobility Overal bed mobility: Needs Assistance Bed Mobility: Rolling;Sidelying to Sit Rolling: Supervision Sidelying to sit: Supervision       General bed mobility comments: VC's for sequencing and technique for proper log roll.   Transfers Overall transfer level: Needs assistance Equipment used: None Transfers: Sit to/from Stand Sit to Stand: Supervision         General transfer comment: Pt was able to power-up to full standing position without physical assistance. Supervision for safety and VC's for maintenance of precautions.   Ambulation/Gait Ambulation/Gait assistance: Min guard;Supervision Ambulation Distance (Feet): 300 Feet Assistive device: None Gait Pattern/deviations: Step-through pattern;Decreased stride length;Trunk flexed Gait velocity: Decreased Gait velocity interpretation: Below normal speed for age/gender General Gait Details: VC's  for improved posture and general safety.  Stairs            Wheelchair Mobility    Modified Rankin (Stroke Patients Only)       Balance Overall balance assessment: Needs assistance Sitting-balance support: Feet supported;No upper extremity supported Sitting balance-Leahy Scale: Good     Standing balance support: No upper extremity supported;During functional activity Standing balance-Leahy Scale: Fair                               Pertinent Vitals/Pain Pain Assessment: 0-10 Pain Score: 5  Pain Location: Incision site Pain Descriptors / Indicators: Operative site guarding Pain Intervention(s): Limited activity within patient's tolerance;Monitored during session;Repositioned    Home Living Family/patient expects to be discharged to:: Private residence Living Arrangements: Spouse/significant other Available Help at Discharge: Family;Available 24 hours/day Type of Home: House       Home Layout: Two level;Able to live on main level with bedroom/bathroom Home Equipment: None Additional Comments: wife will be available to help with all adls.    Prior Function Level of Independence: Independent               Hand Dominance   Dominant Hand: Right    Extremity/Trunk Assessment   Upper Extremity Assessment Upper Extremity Assessment: Overall WFL for tasks assessed    Lower Extremity Assessment Lower Extremity Assessment: Generalized weakness (consistent with pre-op diagnosis)    Cervical / Trunk Assessment Cervical / Trunk Assessment: Other exceptions (s/p surg)  Communication   Communication: No difficulties  Cognition Arousal/Alertness: Awake/alert Behavior During Therapy: WFL for tasks assessed/performed Overall Cognitive Status: Within Functional Limits for tasks assessed  General Comments      Exercises     Assessment/Plan    PT Assessment Patent does not need any further PT services  PT Problem  List            PT Treatment Interventions      PT Goals (Current goals can be found in the Care Plan section)  Acute Rehab PT Goals PT Goal Formulation: All assessment and education complete, DC therapy    Frequency     Barriers to discharge        Co-evaluation               End of Session Equipment Utilized During Treatment: Back brace Activity Tolerance: Patient tolerated treatment well Patient left: in chair;with call bell/phone within reach;with family/visitor present Nurse Communication: Mobility status    Functional Assessment Tool Used: Clinical judgement Functional Limitation: Mobility: Walking and moving around Mobility: Walking and Moving Around Current Status [id number]): At least 1 percent but less than 20 percent impaired, limited or restricted Mobility: Walking and Moving Around Goal Status [phone]): At least 1 percent but less than 20 percent impaired, limited or restricted Mobility: Walking and Moving Around Discharge Status [phone]): At least 1 percent but less than 20 percent impaired, limited or restricted    Time: 0752-0818 PT Time Calculation (min) (ACUTE ONLY): 26 min   Charges:   PT Evaluation $PT Eval Moderate Complexity: 1 Procedure PT Treatments $Gait Training: 8-22 mins   PT G Codes:   PT G-Codes **NOT FOR INPATIENT CLASS** Functional Assessment Tool Used: Clinical judgement Functional Limitation: Mobility: Walking and moving around Mobility: Walking and Moving Around Current Status [id number]): At least 1 percent but less than 20 percent impaired, limited or restricted Mobility: Walking and Moving Around Goal Status [phone]): At least 1 percent but less than 20 percent impaired, limited or restricted Mobility: Walking and Moving Around Discharge Status [phone]): At least 1 percent but less than 20 percent impaired, limited or restricted    Thelma Comp 03/09/2016, 1:44 PM  Rolinda Roan, PT, DPT Acute Rehabilitation Services Pager:  [phone]

## 2016-03-09 NOTE — Progress Notes (Signed)
    Subjective: Procedure(s) (LRB): Lumbar 3-5 decompression with in situ fusion (N/A) 1 Day Post-Op  Patient reports pain as 2 on 0-10 scale.  Reports decreased leg pain reports incisional back pain   Positive void Negative bowel movement Negative flatus Positive chest pain or shortness of breath  Objective: Vital signs in last 24 hours: Temp:  [97.5 F (36.4 C)-98.3 F (36.8 C)] 98.1 F (36.7 C) (01/26 0503) Pulse Rate:  [58-85] 78 (01/26 0503) Resp:  [10-20] 20 (01/26 0503) BP: (148-194)/(58-89) 156/65 (01/26 0503) SpO2:  [98 %-100 %] 100 % (01/26 0503) Weight:  [104.1 kg (229 lb 6.4 oz)] 104.1 kg (229 lb 6.4 oz) (01/25 1052)  Intake/Output from previous day: 01/25 0701 - 01/26 0700 In: 2390 [P.O.:240; I.V.:2000; IV Piggyback:150] Out: 1950 [Urine:1650; Blood:300]  Labs: No results for input(s): WBC, RBC, HCT, PLT in the last 72 hours. No results for input(s): NA, K, CL, CO2, BUN, CREATININE, GLUCOSE, CALCIUM in the last 72 hours. No results for input(s): LABPT, INR in the last 72 hours.  Physical Exam: Neurologically intact ABD soft Intact pulses distally Incision: dressing C/D/I Compartment soft  Assessment/Plan: Patient stable  xrays n/a Continue mobilization with physical therapy Continue care  Advance diet Up with therapy Plan for discharge tomorrow  Melina Schools, MD [place] [phone]

## 2016-03-09 NOTE — Op Note (Signed)
[name], [name]             ACCOUNT NO.:  [id number]  MEDICAL RECORD NO.:  [id number]  LOCATION:                                 FACILITY:  PHYSICIAN:  Tacari Repass D. Rolena Infante, M.D. 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  OPERATIVE PROCEDURES: 1. L3-L5 lumbar decompression. 2. Posterolateral in situ fusion L3-L5. 3. Central lateral recess and foraminal decompression with partial     facetectomy and foraminotomy.  COMPLICATIONS:  None.  FIRST ASSISTANT:  Ronette Deter, [place].  HISTORY:  This is a very pleasant 76 year old gentleman who had severe spinal stenosis with neurogenic claudication.  Attempts at conservative management had failed to alleviate his symptoms and so we elected to proceed with surgery.  All appropriate risks, benefits, and alternatives were discussed with the patient and consent was obtained.  OPERATIVE NOTE:  The patient was brought to the operating room and placed supine on the operating table.  After successful induction of general anesthesia and endotracheal intubation, TEDs, SCDs, and Foley were inserted.  The patient was turned prone onto the Wilson frame and all bony prominences were well padded.  The back was prepped and draped in a standard fashion.  Time-out was taken confirming patient, procedure, and all other pertinent important data.  Two needles were then placed into the back.  X-ray was taken to map out my incision.  I then marked the incision site and infiltrated with 0.25% Marcaine.  A midline incision was made and sharp dissection was carried out down to the deep fascia.  I then incised the deep fascia and stripped the paraspinal  muscles using Cobb and Bovie to expose the spinous process and lamina at L3, L4, and L5.  I then carried my dissection out over the 3-4, 4-5, and 5-1 facet capsules to expose the L5, L4, and L3 transverse processes.  I then repeated this on the contralateral side.  Once I had the entire posterior aspect of the spine exposed, I placed a Penfield 4 underneath the L4 lamina and took a second x-ray confirming my level. Once this was done, I then removed the spinous process of L4 and L3 in their entirety.  I then used a curette to remove the very thickened calcified ligamentum flavum.  I then used my 3 mm and 4 mm Kerrison punch to perform a central laminotomy of L4.  I then was able to dissect through the central raphe using a Penfield 4 and created a space between the thecal sac and the ligamentum flavum.  I then exploited this plane with my 3 mm Kerrison and removed the ligamentum flavum.  I then undercut the L5 leading superior lamina and then went into the lateral recess.  Working to the contralateral side, I worked into the lateral recess.  I identified the L5 pedicle and then went into the L5 foramen. I was able to identify the L5 nerve and ensured that it was adequately decompressed.  I then continued superiorly in the lateral recess, resecting the medial overhanging osteophyte from the facet and performing a foraminotomy.  I then continued superiorly.  Once I had completed that portion of the decompression, I turned my attention at L3.  Using the same technique, I performed a central laminotomy of L3 and exposed the thecal sac and removed the thickened ligamentum flavum over.  I now had the remaining portion of L4.  I passed a neural patty underneath the L4 remaining lamina and then completed my laminectomy of L4.  Once this was completed, I had the L3-4, L4-5 central decompression complete.  I carried my decompression superiorly until I had the majority of the L3 lamina removed.  I  then went into the lateral recess and decompressed into the lateral recess.  I was able to palpate and visualize the L3 pedicle and then the L3 nerve.  Again, I removed the overhanging osteophyte from the facet and performed a foraminotomy.  I then went down to the L4 level and decompressed this level.  At this point, the L3, L4, and L5 nerve roots were visualized and decompressed and the lateral recess was also decompressed.  I then went to the contralateral side and repeated the same exact procedure again working across to the contralateral side.  At this point, I completed my entire decompression from L3 to L5.  I took a third x-ray confirming that I was spanning from below the L5 pedicle to just above the L3 pedicle.  This spanned the area of maximal stenosis seen on the preoperative MRI.  At this point with the decompression complete, I obtained hemostasis using bipolar electrocautery and FloSeal.  I then placed a 4 x 18 sponge over the exposed thecal sac and then retracted the posterolateral gutter and exposed the transverse process.  Using a high-speed bur, I decorticated the 3, 4, and 5 spinous processes and then packed the posterolateral corner with a combination of local bone graft from the decompression and ViviGen.  I did this all bilaterally.  Once the posterolateral bone graft was in place, I irrigated again copiously with normal saline and then checked one last time with my [facility] elevator to ensure that I had bilateral foraminal and lateral recess decompression from L3 to L5. Once this was complete and I had hemostasis, I closed the deep fascia with a #1 Stratafix and then sequentially closed in a layered fashion with interrupted 0, interrupted 2-0, and a 3-0 Monocryl.  Steri-Strips and a bulky dry dressing were applied and the patient was ultimately extubated, transferred to the PACU without incident.  At the end of the case, all needle and sponge counts were correct.   There were no adverse intraoperative events.     Monda Chastain D. Rolena Infante, M.D.   ______________________________ Blake Divine. Rolena Infante, M.D.    DDB/MEDQ  D:  03/08/2016  T:  03/09/2016  Job:  [id number]

## 2016-03-09 NOTE — Progress Notes (Signed)
Report called to Quillian Quince, RN. Pt transferred to 5M09 due to closing of 3C. Pt is stable @ transfer and wife is at the bedside. Holli Humbles, RN

## 2016-03-09 NOTE — Evaluation (Signed)
Occupational Therapy Evaluation Patient Details Name: BONHAM MASSARO MRN: [id number] DOB: 02/02/1941 Today's Date: 03/09/2016    History of Present Illness 76 yo male s/p L 3-5 decompression fusion  Past Medical History:  Diagnosis Date  . CAD, multiple vessel 05/2010; 03/2012--- cardiologist-  dr Shanon Brow harding   CABG X 5; cath 03/2012 --> LIMA-LAD - patent, SVG-Ramus - anastamotic 80% - DES stent; SVG-RCA & SVG-OM1-OM2-- occluded; DES Stent to Circumflex-OM1    . Cardiomyopathy, ischemic- moderate LVD (EF of roughly 45%) at cath 2/143 11/03/2012  . Carpal tunnel syndrome on right   . Chronic back pain    spinal stenosis and scoliosis  . DDD (degenerative disc disease), lumbosacral   . Diabetes type 2, controlled ([place])    takes Metformin once a wk  . 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 Dyslipidemia    takes Simvastatin daily  . First degree heart block   . GERD (gastroesophageal reflux disease)   . History of colon polyps    benign  . History of shingles    forehead --  dec 2016--  no residual pain  . Hypertension    takes Coreg,HCTZ,and Micardis daily  . Osteoarthritis   . Peripheral arterial disease [facility]) May 2016  (seen by dr berry)   L. ABI 0.58 with high grade L. SFA stenosis and occluded L. DP. R. ABI 0.48 with occluded R. SFA and occluded R. DP.  . Pneumonia    hx of-as a child  . Weakness    numbness and tingling  '   Clinical Impression   Patient evaluated by Occupational Therapy with no further acute OT needs identified. All education has been completed and the patient has no further questions. See below for any follow-up Occupational Therapy or equipment needs. OT to sign off. Thank you for referral.      Follow Up Recommendations  No OT follow up    Equipment Recommendations  3 in 1 bedside commode    Recommendations for Other Services       Precautions / Restrictions Precautions Precautions: Back Precaution Comments: handout  present and reviewed for adls. Pt able to recall 3 out 3 by the end of session Required Braces or Orthoses: Spinal Brace Spinal Brace: Lumbar corset;Applied in sitting position Restrictions Weight Bearing Restrictions: No      Mobility Bed Mobility               General bed mobility comments: reviewed and educated on sequence. Pt with high bed at home. Pt with good recall and verbalization.   Transfers Overall transfer level: Needs assistance   Transfers: Sit to/from Stand Sit to Stand: Supervision              Balance                                            ADL Overall ADL's : Needs assistance/impaired Eating/Feeding: Independent       Upper Body Bathing: Supervision/ safety   Lower Body Bathing: Minimal assistance Lower Body Bathing Details (indicate cue type and reason): wife plans to (A)  Upper Body Dressing : Supervision/safety     Lower Body Dressing Details (indicate cue type and reason): unable to reach feet . pt wife plans to (A). pt declines AE education Toilet Transfer: Supervision/safety  General ADL Comments: Pt able to don doff brace. pt able to recall precautions. wife asking questions and present for all education   Back handout provided and reviewed adls in detail. Pt educated on: clothing between brace, never sleep in brace, set an alarm at night for medication, avoid sitting for long periods of time, correct bed positioning for sleeping, correct sequence for bed mobility, avoiding lifting more than 5 pounds and never wash directly over incision. All education is complete and patient indicates understanding. Pt educated on shower transfer as well.     Vision     Perception     Praxis      Pertinent Vitals/Pain Pain Assessment: No/denies pain     Hand Dominance Right   Extremity/Trunk Assessment Upper Extremity Assessment Upper Extremity Assessment: Overall WFL for tasks assessed   Lower  Extremity Assessment Lower Extremity Assessment: Defer to PT evaluation   Cervical / Trunk Assessment Cervical / Trunk Assessment: Other exceptions (s/p surg)   Communication Communication Communication: No difficulties   Cognition Arousal/Alertness: Awake/alert Behavior During Therapy: WFL for tasks assessed/performed Overall Cognitive Status: Within Functional Limits for tasks assessed                     General Comments       Exercises       Shoulder Instructions      Home Living Family/patient expects to be discharged to:: Private residence Living Arrangements: Spouse/significant other Available Help at Discharge: Family;Available 24 hours/day Type of Home: House       Home Layout: Two level;Able to live on main level with bedroom/bathroom     Bathroom Shower/Tub: Occupational psychologist: Standard     Home Equipment: None   Additional Comments: wife will be available to help with all adls.      Prior Functioning/Environment Level of Independence: Independent                 OT Problem List:     OT Treatment/Interventions:      OT Goals(Current goals can be found in the care plan section)    OT Frequency:     Barriers to D/C:            Co-evaluation              End of Session Equipment Utilized During Treatment: Back brace Nurse Communication: Mobility status;Precautions  Activity Tolerance: Patient tolerated treatment well Patient left: in chair;with call bell/phone within reach;with family/visitor present   Time: [id number] OT Time Calculation (min): 11 min Charges:  OT General Charges $OT Visit: 1 Procedure OT Evaluation $OT Eval Low Complexity: 1 Procedure G-Codes: OT G-codes **NOT FOR INPATIENT CLASS** Functional Assessment Tool Used: clinical judgement Functional Limitation: Self care Self Care Current Status [id number]): At least 1 percent but less than 20 percent impaired, limited or restricted Self  Care Goal Status [id number]): At least 1 percent but less than 20 percent impaired, limited or restricted Self Care Discharge Status [phone]): At least 1 percent but less than 20 percent impaired, limited or restricted  Parke Poisson B 03/09/2016, 11:41 AM

## 2016-03-10 DIAGNOSIS — M48061 Spinal stenosis, lumbar region without neurogenic claudication: Secondary | ICD-10-CM | POA: Diagnosis not present

## 2016-03-10 LAB — GLUCOSE, CAPILLARY
GLUCOSE-CAPILLARY: 143 mg/dL — AB (ref 65–99)
GLUCOSE-CAPILLARY: 131 mg/dL — AB (ref 65–99)
Glucose-Capillary: 152 mg/dL — ABNORMAL HIGH (ref 65–99)
Glucose-Capillary: 147 mg/dL — ABNORMAL HIGH (ref 65–99)

## 2016-03-10 LAB — HEMOGLOBIN A1C
Hgb A1c MFr Bld: 7.3 % — ABNORMAL HIGH (ref 4.8–5.6)
MEAN PLASMA GLUCOSE: 163 mg/dL

## 2016-03-10 MED ORDER — DOCUSATE SODIUM 100 MG PO CAPS
100.0000 mg | ORAL_CAPSULE | Freq: Two times a day (BID) | ORAL | Status: DC
  Administered 2016-03-10 (×2): 100 mg via ORAL
  Filled 2016-03-10 (×3): qty 1

## 2016-03-10 MED ORDER — BISACODYL 5 MG PO TBEC
10.0000 mg | DELAYED_RELEASE_TABLET | Freq: Every day | ORAL | Status: DC | PRN
  Administered 2016-03-10: 10 mg via ORAL
  Filled 2016-03-10: qty 2

## 2016-03-10 MED ORDER — FLEET ENEMA 7-19 GM/118ML RE ENEM
1.0000 | ENEMA | Freq: Once | RECTAL | Status: AC
  Administered 2016-03-10: 1 via RECTAL
  Filled 2016-03-10: qty 1

## 2016-03-10 MED ORDER — SORBITOL 70 % SOLN: 30.0000 mL | Freq: Every day | Status: DC | PRN

## 2016-03-10 NOTE — Clinical Social Work Note (Signed)
CSW acknowledges SNF consult. PT recommending outpatient PT.   CSW signing off. Consult again if any social work needs arise.  Dayton Scrape, [place]

## 2016-03-10 NOTE — Progress Notes (Addendum)
Attempted to see patient x2 this AM. Spoke with his wife. Pt is bathroom both times attempting to have BM. Received mag citrate yesterday- will hold off on repeat administration of that for now. Added colace BID and Dulcolax PO as well if needed. Will re-eval later today for possible D/C home.

## 2016-03-10 NOTE — Progress Notes (Signed)
Subjective: 2 Days Post-Op Procedure(s) (LRB): Lumbar 3-5 decompression with in situ fusion (N/A) Patient reports pain as mild.  Reports back pain well controlled as long as he is lying down. Leg pain from pre-op resolved. C/o constipation, 4 days since last BM. Received mag citrate yesterday no BM yet. He tells me he is not sure if he is passing gas but feels like his stomach is "rolling". Not distended. Has been gagging but not actually vomiting.  Objective: Vital signs in last 24 hours: Temp:  [97 F (36.1 C)-98.8 F (37.1 C)] 98.8 F (37.1 C) (01/27 1400) Pulse Rate:  [68-85] 78 (01/27 1400) Resp:  [18-20] 20 (01/27 1400) BP: (117-161)/(45-78) 149/46 (01/27 1400) SpO2:  [95 %-100 %] 100 % (01/27 1400)  Intake/Output from previous day: 01/26 0701 - 01/27 0700 In: 720 [P.O.:720] Out: -  Intake/Output this shift: Total I/O In: 360 [P.O.:360] Out: -   No results for input(s): HGB in the last 72 hours. No results for input(s): WBC, RBC, HCT, PLT in the last 72 hours. No results for input(s): NA, K, CL, CO2, BUN, CREATININE, GLUCOSE, CALCIUM in the last 72 hours. No results for input(s): LABPT, INR in the last 72 hours.  Neurologically intact ABD soft Neurovascular intact Sensation intact distally Intact pulses distally Dorsiflexion/Plantar flexion intact Incision: dressing C/D/I and no drainage No cellulitis present Compartment soft no sign of DVT  Assessment/Plan: 2 Days Post-Op Procedure(s) (LRB): Lumbar 3-5 decompression with in situ fusion (N/A) Advance diet Up with therapy D/C IV fluids  Fleets enema now Sorbitol ordered per Dr. Lyla Glassing Colace BID and dulcolax po prn ordered earlier today If he becomes distended or begins vomiting plan KUB, general surgery consult to r/o SBO, NG tube Likely D/C tomorrow Discussed with Dr. Ewing Schlein, [place]. 03/10/2016, 2:38 PM

## 2016-03-11 DIAGNOSIS — M48061 Spinal stenosis, lumbar region without neurogenic claudication: Secondary | ICD-10-CM | POA: Diagnosis not present

## 2016-03-11 LAB — GLUCOSE, CAPILLARY
GLUCOSE-CAPILLARY: 130 mg/dL — AB (ref 65–99)
Glucose-Capillary: 128 mg/dL — ABNORMAL HIGH (ref 65–99)

## 2016-03-11 NOTE — Care Management Note (Signed)
Case Management Note  Patient Details  Name: [name] MRN: [id number] Date of Birth: October 27, 1940  Subjective/Objective:                  Lumbar 3-5 decompression with in situ fusion (N/A) Action/Plan: Discharge planning Expected Discharge Date:  03/11/16               Expected Discharge Plan:  Home/Self Care  In-House Referral:     Discharge planning Services  CM Consult  Post Acute Care Choice:    Choice offered to:  Patient  DME Arranged:  N/A DME Agency:  NA  HH Arranged:  NA HH Agency:  NA  Status of Service:  Completed, signed off  If discussed at [place] of Stay Meetings, dates discussed:    Additional Comments: CM received call from RN to please set up outpt PT/OT. CM notes pt lives in [place] and [place] will get prescription so pt can take to outpt center of choice.  No other CM needs were communicated.  Dellie Catholic, RN 03/11/2016, 11:55 AM

## 2016-03-11 NOTE — Progress Notes (Signed)
PT and OT recommending outpatient PT and OT. PA, Asencion Partridge stated that Dr. Rolena Infante does not usually order therapy until 6 weeks post op. This information given to patient

## 2016-03-11 NOTE — Progress Notes (Signed)
Subjective: 3 Days Post-Op Procedure(s) (LRB): Lumbar 3-5 decompression with in situ fusion (N/A) Patient reports pain as mild.  Reports back pain well controlled. Leg pain from pre-op resolved. Had large BM yesterday and denies n/v today.  Tolerated breakfast well.  Ready to go home.  Objective: Vital signs in last 24 hours: Temp:  [98.3 F (36.8 C)-98.9 F (37.2 C)] 98.4 F (36.9 C) (01/28 0456) Pulse Rate:  [76-85] 85 (01/28 0456) Resp:  [18-20] 18 (01/28 0456) BP: (124-176)/(46-66) 168/61 (01/28 0456) SpO2:  [96 %-100 %] 98 % (01/28 0456)  Intake/Output from previous day: 01/27 0701 - 01/28 0700 In: 1880 [P.O.:1880] Out: -  Intake/Output this shift: Total I/O In: 120 [P.O.:120] Out: -   No results for input(s): HGB in the last 72 hours. No results for input(s): WBC, RBC, HCT, PLT in the last 72 hours. No results for input(s): NA, K, CL, CO2, BUN, CREATININE, GLUCOSE, CALCIUM in the last 72 hours. No results for input(s): LABPT, INR in the last 72 hours.  Neurologically intact ABD soft Neurovascular intact Sensation intact distally Intact pulses distally Dorsiflexion/Plantar flexion intact Incision: dressing C/D/I and no drainage No cellulitis present Compartment soft no sign of DVT  Assessment/Plan: 3 Days Post-Op Procedure(s) (LRB): Lumbar 3-5 decompression with in situ fusion (N/A) Advance diet Up with therapy D/C IV fluids  Doing well now after his BM Dc home today  Nicholes Stairs 03/11/2016, 9:04 AM

## 2016-03-11 NOTE — Progress Notes (Signed)
Patient discharged home with wife. Discharge instructions were reviewed with patient, and wife; Both verbalized understanding.

## 2016-03-12 ENCOUNTER — Telehealth: Payer: Self-pay | Admitting: Cardiology

## 2016-03-12 NOTE — Telephone Encounter (Signed)
Clarification on restarting  Brilinta. Per wife  DR brooks office did not indicate when to restart medication.   aware will clarify with Dr Ellyn Hack  1 - how many days to restart ,discharge on 03/10/16 or should contact Dr Rolena Infante 'OFFICE

## 2016-03-12 NOTE — Telephone Encounter (Signed)
Pt's wife called to see when he can start back on Brilinta-pls call

## 2016-03-12 NOTE — Telephone Encounter (Signed)
This is usually when safe following surgery. Anywhere from 2-3 days, but it would be based on when Dr. Rolena Infante feels it is safe.  Glenetta Hew, MD

## 2016-03-13 NOTE — Telephone Encounter (Signed)
Returned call to wife-wife states she spoke with Dr. Rolena Infante yesterday and they started the Brilinta back yesterday as well.  Made aware of Dr. Ellyn Hack recommendations.  Wife verbalized understanding.  Advised to call with further questions/concerns.

## 2016-03-21 NOTE — Discharge Summary (Signed)
Physician Discharge Summary  Patient ID: [name] MRN: [id number] DOB/AGE: Jan 12, 1941 76 y.o.  Admit date: 03/08/2016 Discharge date: 03/21/2016  Admission Diagnoses:  Lumbar DDD  Discharge Diagnoses:  Active Problems:   Back pain   Past Medical History:  Diagnosis Date  . CAD, multiple vessel 05/2010; 03/2012--- cardiologist-  dr Shanon Brow harding   CABG X 5; cath 03/2012 --> LIMA-LAD - patent, SVG-Ramus - anastamotic 80% - DES stent; SVG-RCA & SVG-OM1-OM2-- occluded; DES Stent to Circumflex-OM1    . Cardiomyopathy, ischemic- moderate LVD (EF of roughly 45%) at cath 2/143 11/03/2012  . Carpal tunnel syndrome on right   . Chronic back pain    spinal stenosis and scoliosis  . DDD (degenerative disc disease), lumbosacral   . Diabetes type 2, controlled ([place])    takes Metformin once a wk  . 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 Dyslipidemia    takes Simvastatin daily  . First degree heart block   . GERD (gastroesophageal reflux disease)   . History of colon polyps    benign  . History of shingles    forehead --  dec 2016--  no residual pain  . Hypertension    takes Coreg,HCTZ,and Micardis daily  . Osteoarthritis   . Peripheral arterial disease [facility]) May 2016  (seen by dr berry)   L. ABI 0.58 with high grade L. SFA stenosis and occluded L. DP. R. ABI 0.48 with occluded R. SFA and occluded R. DP.  . Pneumonia    hx of-as a child  . Weakness    numbness and tingling    Surgeries: Procedure(s): Lumbar 3-5 decompression with in situ fusion on 03/08/2016   Consultants (if any):   Discharged Condition: Improved  Hospital Course: [name] is an 76 y.o. male who was admitted 03/08/2016 with a diagnosis of  and went to the operating room on 03/08/2016 and underwent the above named procedures.  Post op day 3 he reports decreased pain controlled on oral meds.  Pt is urinating.  Pt reports decreased leg pain.  Pt had BM day 2 post op.  Pt is eating well w/o  nausea.   He was given perioperative antibiotics:  Anti-infectives    Start     Dose/Rate Route Frequency Ordered Stop   03/08/16 1700  ceFAZolin (ANCEF) IVPB 2g/100 mL premix     2 g 200 mL/hr over 30 Minutes Intravenous Every 8 hours 03/08/16 1659 03/09/16 0037   03/08/16 1034  ceFAZolin (ANCEF) IVPB 2g/100 mL premix     2 g 200 mL/hr over 30 Minutes Intravenous 30 min pre-op 03/08/16 1034 03/08/16 1218    .  He was given sequential compression devices, early ambulation, and TED for DVT prophylaxis.  He benefited maximally from the hospital stay and there were no complications.    Recent vital signs:  Vitals:   03/11/16 0456 03/11/16 0922  BP: (!) 168/61 (!) 101/46  Pulse: 85 66  Resp: 18 18  Temp: 98.4 F (36.9 C)     Recent laboratory studies:  Lab Results  Component Value Date   HGB 12.5 (L) 03/02/2016   HGB 13.3 10/10/2015   HGB 13.0 04/09/2012   Lab Results  Component Value Date   WBC 6.1 03/02/2016   PLT 145 (L) 03/02/2016   Lab Results  Component Value Date   INR 1.02 04/08/2012   Lab Results  Component Value Date   NA 135 03/02/2016   K 4.0 03/02/2016  CL 104 03/02/2016   CO2 21 (L) 03/02/2016   BUN 21 (H) 03/02/2016   CREATININE 1.27 (H) 03/02/2016   GLUCOSE 238 (H) 03/02/2016    Discharge Medications:   Allergies as of 03/11/2016      Reactions   No Known Allergies       Medication List    STOP taking these medications   ALEVE 220 MG Caps Generic drug:  Naproxen Sodium     TAKE these medications   carvedilol 25 MG tablet Commonly known as:  COREG TAKE 1 TABLET (25 MG TOTAL) BY MOUTH 2 (TWO) TIMES DAILY.   fish oil-omega-3 fatty acids 1000 MG capsule Take 1 g by mouth daily.   hydrochlorothiazide 25 MG tablet Commonly known as:  HYDRODIURIL Take 1 tablet (25 mg total) by mouth daily. What changed:  when to take this   metFORMIN 500 MG tablet Commonly known as:  GLUCOPHAGE Take 500 mg by mouth daily with breakfast.    methocarbamol 500 MG tablet Commonly known as:  ROBAXIN Take 1 tablet (500 mg total) by mouth 3 (three) times daily as needed for muscle spasms.   nitroGLYCERIN 0.4 MG SL tablet Commonly known as:  NITROSTAT Place 1 tablet (0.4 mg total) under the tongue every 5 (five) minutes as needed for chest pain.   ondansetron 4 MG tablet Commonly known as:  ZOFRAN Take 1 tablet (4 mg total) by mouth every 8 (eight) hours as needed for nausea or vomiting.   oxyCODONE-acetaminophen 10-325 MG tablet Commonly known as:  PERCOCET Take 1 tablet by mouth every 4 (four) hours as needed for pain.   ROLAIDS PO Take 1 tablet by mouth 3 (three) times daily as needed.   simvastatin 40 MG tablet Commonly known as:  ZOCOR TAKE 1 TABLET (40 MG TOTAL) BY MOUTH EVERY EVENING.   telmisartan 80 MG tablet Commonly known as:  MICARDIS TAKE 1 TABLET (80 MG TOTAL) BY MOUTH DAILY.   ticagrelor 90 MG Tabs tablet Commonly known as:  BRILINTA Take 1 tablet (90 mg total) by mouth 2 (two) times daily.       Diagnostic Studies: Dg Lumbar Spine 2-3 Views  Addendum Date: 03/08/2016   ADDENDUM REPORT: 03/08/2016 14:59 ADDENDUM: Followup image is performed at 1420 hours, demonstrating a posterior retractor in place. Surgical instruments are identified posterior to L3 and L5. Status post laminectomy at L3 and L4. Electronically Signed   By: Nolon Nations M.D.   On: 03/08/2016 14:59   Result Date: 03/08/2016 CLINICAL DATA:  Lumbar decompression, levels L3-L5 EXAM: LUMBAR SPINE - 2-3 VIEW COMPARISON:  CT lumbar spine 06/18/2014 FINDINGS: Surgical instruments are identified posterior to the vertebral bodies of L2 and L4. IMPRESSION: Intraoperative localization. Electronically Signed: By: Nolon Nations M.D. On: 03/08/2016 13:28   Dg Lumbar Spine 1 View  Result Date: 03/08/2016 CLINICAL DATA:  Lumber Decompression, level 3-5 STAT READ PLEASE NUMBER SPINE PER MD EXAM: LUMBAR SPINE - 1 VIEW COMPARISON:  06/18/2014  FINDINGS: Surgical instrument is identified posterior to the L5 vertebral body. Disc height loss and degenerative changes identified at numerous levels, particularly at L4-5 and L5-S1. IMPRESSION: Intraoperative localization. Electronically Signed   By: Nolon Nations M.D.   On: 03/08/2016 13:26    Disposition: 01-Home or Self Care Pt will present to clinic in 2 weeks Post op medications provided  Discharge Instructions    Call MD / Call 911    Complete by:  As directed    If you experience chest  pain or shortness of breath, CALL 911 and be transported to the hospital emergency room.  If you develope a fever above 101 F, pus (white drainage) or increased drainage or redness at the wound, or calf pain, call your surgeon's office.   Constipation Prevention    Complete by:  As directed    Drink plenty of fluids.  Prune juice may be helpful.  You may use a stool softener, such as Colace (over the counter) 100 mg twice a day.  Use MiraLax (over the counter) for constipation as needed.   Diet - low sodium heart healthy    Complete by:  As directed    Increase activity slowly as tolerated    Complete by:  As directed       Follow-up Information    BROOKS,DAHARI D, MD In 2 weeks.   Specialty:  Orthopedic Surgery Why:  If symptoms worsen, For suture removal, For wound re-check Contact information: [address] [place] [address] [id number]            Signed: Valinda Hoar 03/21/2016, 12:52 PM

## 2016-03-21 NOTE — Addendum Note (Signed)
Addendum  created 03/21/16 0933 by Myrtie Soman, MD   Anesthesia Staff edited

## 2016-04-12 HISTORY — PX: NM MYOVIEW LTD: HXRAD82

## 2016-05-04 ENCOUNTER — Telehealth (HOSPITAL_COMMUNITY): Payer: Self-pay

## 2016-05-04 NOTE — Telephone Encounter (Signed)
Encounter complete. 

## 2016-05-07 ENCOUNTER — Telehealth: Payer: Self-pay | Admitting: Cardiology

## 2016-05-07 NOTE — Telephone Encounter (Signed)
New message    Pt wife is calling asking about his instructions before myocardial perfusion.

## 2016-05-07 NOTE — Telephone Encounter (Signed)
FORWARD TO AMANDA - NUCLEAR

## 2016-05-08 ENCOUNTER — Telehealth (HOSPITAL_COMMUNITY): Payer: Self-pay

## 2016-05-08 NOTE — Telephone Encounter (Signed)
Encounter complete. 

## 2016-05-09 ENCOUNTER — Ambulatory Visit (HOSPITAL_COMMUNITY)
Admission: RE | Admit: 2016-05-09 | Discharge: 2016-05-09 | Disposition: A | Discharge status: Home / Self Care | Payer: Medicare Other | Source: Ambulatory Visit | Attending: Cardiology | Admitting: Cardiology

## 2016-05-09 DIAGNOSIS — E119 Type 2 diabetes mellitus without complications: Secondary | ICD-10-CM | POA: Insufficient documentation

## 2016-05-09 DIAGNOSIS — Z951 Presence of aortocoronary bypass graft: Secondary | ICD-10-CM | POA: Insufficient documentation

## 2016-05-09 DIAGNOSIS — E785 Hyperlipidemia, unspecified: Secondary | ICD-10-CM | POA: Diagnosis present

## 2016-05-09 DIAGNOSIS — Z9889 Other specified postprocedural states: Secondary | ICD-10-CM | POA: Diagnosis not present

## 2016-05-09 DIAGNOSIS — I739 Peripheral vascular disease, unspecified: Secondary | ICD-10-CM | POA: Diagnosis not present

## 2016-05-09 DIAGNOSIS — E1169 Type 2 diabetes mellitus with other specified complication: Secondary | ICD-10-CM

## 2016-05-09 DIAGNOSIS — I519 Heart disease, unspecified: Secondary | ICD-10-CM | POA: Diagnosis not present

## 2016-05-09 DIAGNOSIS — I2510 Atherosclerotic heart disease of native coronary artery without angina pectoris: Secondary | ICD-10-CM

## 2016-05-09 DIAGNOSIS — I10 Essential (primary) hypertension: Secondary | ICD-10-CM | POA: Diagnosis present

## 2016-05-09 DIAGNOSIS — Z87891 Personal history of nicotine dependence: Secondary | ICD-10-CM | POA: Diagnosis not present

## 2016-05-09 LAB — MYOCARDIAL PERFUSION IMAGING
CHL CUP MPHR: 147 {beats}/min
CHL CUP NUCLEAR SRS: 4
CHL CUP NUCLEAR SSS: 11
CSEPPHR: 146 {beats}/min
Estimated workload: 7 METS
Exercise duration (min): 5 min
LV dias vol: 125 mL (ref 62–150)
LVSYSVOL: 82 mL
NUC STRESS TID: 1.02
Percent HR: 100 %
RPE: 15
Rest HR: 71 {beats}/min
SDS: 7

## 2016-05-09 MED ORDER — TECHNETIUM TC 99M TETROFOSMIN IV KIT
30.1000 | PACK | Freq: Once | INTRAVENOUS | Status: AC | PRN
  Administered 2016-05-09: 30.1 via INTRAVENOUS
  Filled 2016-05-09: qty 31

## 2016-05-09 MED ORDER — TECHNETIUM TC 99M TETROFOSMIN IV KIT
10.9000 | PACK | Freq: Once | INTRAVENOUS | Status: AC | PRN
  Administered 2016-05-09: 10.9 via INTRAVENOUS
  Filled 2016-05-09: qty 11

## 2016-05-10 ENCOUNTER — Telehealth: Payer: Self-pay | Admitting: *Deleted

## 2016-05-10 NOTE — Telephone Encounter (Signed)
Spoke to Dr Ellyn Hack, per order appointment schedule for  Next week.  RN spoke to patient, informed him of abnormal test would to schedule appointment-Monday April 2 10:20 am.   patient states he will be able to come to appointment.

## 2016-05-10 NOTE — Telephone Encounter (Signed)
-----   Message from Blenda Bridegroom sent at 05/10/2016  6:55 AM EDT ----- Regarding: High Risk Nuclear study High Risk Nuclear study read by Dr Debara Pickett on May 09, 2016. Patient is a pt of Dr Ellyn Hack.

## 2016-05-14 ENCOUNTER — Ambulatory Visit (INDEPENDENT_AMBULATORY_CARE_PROVIDER_SITE_OTHER): Payer: Medicare Other | Admitting: Cardiology

## 2016-05-14 ENCOUNTER — Encounter: Payer: Self-pay | Admitting: Cardiology

## 2016-05-14 VITALS — BP 156/76 | HR 65 | Ht 74.0 in | Wt 225.2 lb

## 2016-05-14 DIAGNOSIS — I10 Essential (primary) hypertension: Secondary | ICD-10-CM | POA: Diagnosis not present

## 2016-05-14 DIAGNOSIS — R9439 Abnormal result of other cardiovascular function study: Secondary | ICD-10-CM

## 2016-05-14 DIAGNOSIS — I255 Ischemic cardiomyopathy: Secondary | ICD-10-CM

## 2016-05-14 DIAGNOSIS — I25810 Atherosclerosis of coronary artery bypass graft(s) without angina pectoris: Secondary | ICD-10-CM

## 2016-05-14 DIAGNOSIS — I739 Peripheral vascular disease, unspecified: Secondary | ICD-10-CM | POA: Diagnosis not present

## 2016-05-14 DIAGNOSIS — E669 Obesity, unspecified: Secondary | ICD-10-CM | POA: Diagnosis not present

## 2016-05-14 DIAGNOSIS — I2510 Atherosclerotic heart disease of native coronary artery without angina pectoris: Secondary | ICD-10-CM

## 2016-05-14 MED ORDER — TELMISARTAN 80 MG PO TABS: 160.0000 mg | ORAL_TABLET | Freq: Every day | ORAL | 3 refills | Status: DC

## 2016-05-14 NOTE — Assessment & Plan Note (Signed)
Continues to work on weight loss. I encouraged his continued exercise.

## 2016-05-14 NOTE — Assessment & Plan Note (Signed)
No limiting claudication symptoms. I feel pulses bilaterally. We can recheck Dopplers after his follow-up visit in October.

## 2016-05-14 NOTE — Assessment & Plan Note (Signed)
He has had echocardiographic evidence of relatively normal cardiac function, but recent stress test that showed a reduced ejection fraction with most recent one showing a significantly reduced ejection fraction and 35% range. This may simply just be a factor of his prior infarct decreasing the effectiveness of EF evaluation on Myoview. Plan is check 2-D echocardiogram to get a better assessment of ejection fraction and wall motion changes. He is on a very stable regimen of max dose carvedilol, Micardis along with HCTZ.  No signs of heart failure, therefore not requiring full diuretic.

## 2016-05-14 NOTE — Assessment & Plan Note (Addendum)
Blood pressure still remains high. Plan now is to increase Micardis to 160 mg daily. Next step would be likely adding spironolactone amlodipine.

## 2016-05-14 NOTE — Patient Instructions (Addendum)
MEDICATION CHANGE INCREASE MICARDIS TO 160 MG  ( TOTAL OF 2 TABLETS) DAILY  SCHEDULE AT [place] [address]  Your physician has requested that you have an echocardiogram. Echocardiography is a painless test that uses sound waves to create images of your heart. It provides your doctor with information about the size and shape of your heart and how well your heart's chambers and valves are working. This procedure takes approximately one hour. There are no restrictions for this procedure.      KEEP APPOINTMENT - June 07 2016 IF ECHO IS NORMAL , WILL FOLLOW UP IN OCT 2018

## 2016-05-14 NOTE — Assessment & Plan Note (Signed)
The main reason for his visit today was the fact that he had this high risk abnormal stress test. On my review, it really doesn't appear that much different than his previous stress test with the exception of the fact that there is no longer any lateral ischemia. Looking at his coronary anatomy, having a fixed inferior perfusion defect with mild ischemia is not unexpected in the fact that he has no RCA flow either from the native vessel or graft  This would leave his PDA and posterolateral system without primary blood flow.  There were no PCI options noted during his last catheterization it would help this area, but the lateral wall appears to be well perfused following most recent PCI. He is also totally asymptomatic. The only concerning thing for me is that the ejection fraction was read as being lower than before.  Plan: Check 2-D echocardiogram to get her better assessment of the EF. My suspicion is that his EF is probably in the 45% range based on what his last evaluation showed. If his EF is indeed down think we do need to proceed with cardiac Dictation, however if not would simply continue treating medically.

## 2016-05-14 NOTE — Assessment & Plan Note (Signed)
I personally went back and reviewed his films. He does have significant coronary disease and occluded RCA that also does not have a vein graft to it. There is some collateralization from left to right. His last evaluation led to PCI of the proximal circumflex and OM1 as well as the anastomosis of SVG-RI. He continues to do well without any active symptoms despite having an abnormal Myoview.  I think the findings of his current Myoview are pretty much what would be expected based on his known anatomy with no native flow to the RCA. Comparing the images from his 2014 stress test now, the lateral wall seems better perfused. Plan for now is to check a 2-D echocardiogram to confirm his EF. If it is low, I think we may need to proceed with catheterization, if not, would simply follow up in 6 months

## 2016-05-14 NOTE — Progress Notes (Signed)
PCP: Neale Burly, MD  Clinic Note: No chief complaint on file.   HPI: [name] is a 76 y.o. male with a PMH below who presents today for 6 month f/u of CAD-CABG-PCI & PAD. Mr. Lacivita initially underwent cardiac evaluation for preoperative risk gratification. He had abnormal stress test and was found to have multivessel CAD on cath with reduced ejection fraction. He was referred for CABG in April 2012. Roughly 2 years later he had an abnormal stress test and was taken back to the Cath Lab where he was found to have occluded vein graft to the RCA as well as being after OM1 and OM 2. He subsequently underwent PCI of the native circumflex-OM 1 as well as the SVG-RI. Interestingly, Ardell doesn't usually have much the way of any symptoms when he has issues with his coronary disease, he simply does notes some fatigue.  [name] was last seen in October 2017. He was doing well at that time without any symptoms. Usually walks 4 d week ~30 min. at that time was recovering from hand surgery  Recent Hospitalizations: Back Sgx Jan 25 -now doing water exercises.   Studies Reviewed:  Myoview Stress Test 05/09/2016:large-size, moderate intensity reversible perfusion defect in the inferior wall suggestive of ischemia. EF 34%. Global hypokinesis and severe inferior hypokinesis to akinesis. Also noted horizontal ST segment depression in inferior-lateralleads. HIGH RISK  Interval History: [name] presents today actually doing pretty well. He has never really had any significant cardiac symptoms despite having 2 vessel CAD. As usual, Ocean is doing quite well. He is recovering from recent back surgery in January. He went to the surgery without difficulty. He is doing water aerobics/exercises, and still walks about 4 days a week for at least a half an hour. He denies any symptoms of chest tightness pressure with rest or exertion. No exertional dyspnea. No PND, orthopnea or edema. He says he  may have some mild nonlimiting claudication symptoms but nothing overly worrisome to him.  He will have to stop because of claudication when walking or doing the NuStep machine, but after a minute or so is able to get right back onto it. It takes a little while to get going, once he gets going he does fine. He denies any significant heart failure symptoms of PND, orthopnea but does have some chronic lower extremity edema which is relatively stable. He denies any rapid or irregular heartbeat/palpitations except when exercising he feels is starting to walk. He denies any syncope/near syncope or TIA/amaurosis fugax.  ROS: A comprehensive was performed. Review of Systems  Constitutional: Negative for malaise/fatigue and weight loss (Still trying to actively lose weight with diet and exercise.Marland Kitchen).  HENT: Negative for nosebleeds.   Eyes: Negative for blurred vision.  Respiratory: Negative for cough, shortness of breath and wheezing.   Cardiovascular: Positive for claudication (Relatively stable; nonlimiting). Negative for leg swelling.  Gastrointestinal: Positive for heartburn (Depending on what he eats.). Negative for blood in stool and melena.  Genitourinary: Negative for hematuria.  Musculoskeletal: Positive for back pain and joint pain. Negative for myalgias.  Neurological: Negative for dizziness, sensory change, speech change, focal weakness, loss of consciousness and headaches.  Endo/Heme/Allergies: Does not bruise/bleed easily.  Psychiatric/Behavioral: Negative for depression, hallucinations and memory loss. The patient is not nervous/anxious.   All other systems reviewed and are negative.   Past Medical History:  Diagnosis Date  . 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 Ellyn Hack (Cardiologist): For Abnormal ST:  CABG X 5; cath 03/2012 --> LIMA-LAD - patent, SVG-Ramus - anastamotic 80% - DES stent; SVG-RCA & SVG-OM1-OM2-- occluded; DES Stent to Cx-OM1  . Cardiomyopathy, ischemic- moderate LVD  (EF of roughly 45%) at cath 2/143 11/03/2012  . Carpal tunnel syndrome on right   . Chronic back pain    spinal stenosis and scoliosis  . DDD (degenerative disc disease), lumbosacral   . Diabetes type 2, controlled ([place])    takes Metformin once a wk  . 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 Dyslipidemia    takes Simvastatin daily  . First degree heart block   . GERD (gastroesophageal reflux disease)   . History of colon polyps    benign  . History of shingles    forehead --  dec 2016--  no residual pain  . Hypertension    takes Coreg,HCTZ,and Micardis daily  . Osteoarthritis   . Peripheral arterial disease [facility]) May 2016  (seen by dr berry)   L. ABI 0.58 with high grade L. SFA stenosis and occluded L. DP. R. ABI 0.48 with occluded R. SFA and occluded R. DP.  . Pneumonia    hx of-as a child  . Weakness    numbness and tingling    Past Surgical History:  Procedure Laterality Date  . CARDIAC CATHETERIZATION  05/15/2010   abnormal stress test-- 3 vessel CAD/  LVEDP=85mmHg  (Dr. Debara Pickett for Dr,. Ellyn Hack)   . CARPAL TUNNEL RELEASE Right 10/10/2015   Procedure: RIGHT HAND CARPAL TUNNEL RELEASE;  Surgeon: Iran Planas, MD;  Location: [place];  Service: Orthopedics;  Laterality: Right;  . COLONOSCOPY    . CORONARY ANGIOPLASTY     3 stents  . CORONARY ARTERY BYPASS GRAFT  05/22/2010  dr Lucianne Lei tright   x 5 (LIMA-LAD, SVG- RI,. SVG-OM1-OM2, SVG-dRCA)  . DECOMPRESSIVE LUMBAR LAMINECTOMY LEVEL 1 N/A 03/08/2016   Procedure: Lumbar 3-5 decompression with in situ fusion;  Surgeon: Melina Schools, MD;  Location: [place];  Service: Orthopedics;  Laterality: N/A;  . LEFT HEART CATHETERIZATION WITH CORONARY ANGIOGRAM N/A 04/02/2012   Procedure: LEFT HEART CATHETERIZATION WITH CORONARY ANGIOGRAM;  Surgeon: Leonie Man, MD;  Location: [facility] CATH LAB: Occluded SVG-rPDA & native RCA, 100% SVG-OM. Severe anastomotic lesion in SVG-RI.  Patent LIMA-LAD. Severe mid Cx-OM  . NM  MYOVIEW LTD  03/25/2012   Dr. Ellyn Hack: High Risk exercise nuclear study w/ scar in the apex and upper mid to basal inferior wall w/ signgicnat additional iscemia inferiorly to lateral-inferiorly from apex to base/  severe LV dysfunction w/ infero-apical dyskinesis and hypocontractility in the inferior and lateral wall, ef 33%  . NM MYOVIEW LTD  04/2016   large-size, moderate intensity reversible perfusion defect in the inferior wall suggestive of ischemia. EF 34%. Global hypokinesis and severe inferior hypokinesis to akinesis. Also noted horizontal ST segment depression in inferior-lateralleads. HIGH RISK  . PERCUTANEOUS CORONARY STENT INTERVENTION (PCI-S) N/A 04/08/2012   Procedure: PERCUTANEOUS CORONARY STENT INTERVENTION (PCI-S);  Surgeon: Leonie Man, MD;  Location: [facility] CATH LAB: Promus Premier DES 2.25 mm x 12 mm - Mid Cx-OM1; Promus Premier DES 2.5 mm x 16 mm -- anastomotic SVG-RI (graft into native)  . TOTAL KNEE ARTHROPLASTY Right 10/26/2010  . TRANSTHORACIC ECHOCARDIOGRAM  08/21/2010   mild LVH, EF 45-50%, inferior hypokinesis,  grade 1 diastolic dysfunction/  moderate LAE/   mild MR/  trivial TR/  mild dilated RV  . UMBILICAL HERNIA REPAIR  1980's   Current  Meds  Medication Sig  . Ca Carbonate-Mag Hydroxide (ROLAIDS PO) Take 1 tablet by mouth 3 (three) times daily as needed.   . carvedilol (COREG) 25 MG tablet TAKE 1 TABLET (25 MG TOTAL) BY MOUTH 2 (TWO) TIMES DAILY.  . fish oil-omega-3 fatty acids 1000 MG capsule Take 1 g by mouth daily.   . hydrochlorothiazide (HYDRODIURIL) 25 MG tablet Take 25 mg by mouth daily.   . metFORMIN (GLUCOPHAGE) 500 MG tablet Take 500 mg by mouth daily with breakfast.  . methocarbamol (ROBAXIN) 500 MG tablet Take 1 tablet (500 mg total) by mouth 3 (three) times daily as needed for muscle spasms.  . nitroGLYCERIN (NITROSTAT) 0.4 MG SL tablet Place 1 tablet (0.4 mg total) under the tongue every 5 (five) minutes as needed for chest pain.  Marland Kitchen ondansetron  (ZOFRAN) 4 MG tablet Take 1 tablet (4 mg total) by mouth every 8 (eight) hours as needed for nausea or vomiting.  Marland Kitchen oxyCODONE-acetaminophen (PERCOCET) 10-325 MG tablet Take 1 tablet by mouth every 4 (four) hours as needed for pain.  . simvastatin (ZOCOR) 40 MG tablet TAKE 1 TABLET (40 MG TOTAL) BY MOUTH EVERY EVENING.  . ticagrelor (BRILINTA) 90 MG TABS tablet Take 1 tablet (90 mg total) by mouth 2 (two) times daily.  . [DISCONTINUED] telmisartan (MICARDIS) 80 MG tablet TAKE 1 TABLET (80 MG TOTAL) BY MOUTH DAILY.    Allergies  Allergen Reactions  . No Known Allergies     Social History   Social History  . Marital status: Married    Spouse name: N/A  . Number of children: N/A  . Years of education: N/A   Social History Main Topics  . Smoking status: Former Smoker    Types: Cigarettes    Quit date: 09/26/1980  . Smokeless tobacco: Never Used  . Alcohol use No  . Drug use: No  . Sexual activity: Not Asked   Other Topics Concern  . None   Social History Narrative   He is a married father of 2. He exercises routinely at the [facility].Marland Kitchen    He quit smoking about 30 years ago and has social alcohol.    Family History  Problem Relation Age of Onset  . Cancer Mother     Wt Readings from Last 3 Encounters:  05/14/16 225 lb 3.2 oz (102.2 kg)  05/09/16 229 lb (103.9 kg)  03/08/16 229 lb 6.4 oz (104.1 kg)    PHYSICAL EXAM BP (!) 156/76 (BP Location: Right Arm)   Pulse 65   Ht 6\' 2"  (1.88 m)   Wt 225 lb 3.2 oz (102.2 kg)   BMI 28.91 kg/m  General appearance: alert, cooperative, no distress, mildly obese and BMI would not suggest obesity but simply overweight. Well-nourished. Pleasant mood and affect. Otherwise healthy-appearing.  HEENT: [place]/AT, EOMI, MMM, anicteric sclera Neck: no adenopathy, no carotid bruit, no JVD and supple, symmetrical, trachea midline  Lungs: CTAB, normal percussion bilaterally and Nonlabored, good air movement  Heart: RRR, S1& S2 normal, no murmur,  click, rub or gallop and normal apical impulse  Abdomen: soft, non-tender; bowel sounds normal; no masses, no organomegaly  Extremities: extremities normal, atraumatic, no cyanosis or edema  Pulses: 2+ and symmetric  Neurologic: Alert and oriented X 3, normal strength and tone. Normal symmetric reflexes. Normal coordination and gait   Adult ECG Report Not checked  Other studies Reviewed: Additional studies/ records that were reviewed today include:  Recent Labs:  Checked by PCP No results found for: CHOL,  HDL, LDLCALC, LDLDIRECT, TRIG, CHOLHDL   ASSESSMENT / PLAN: Problem List Items Addressed This Visit    Abnormal nuclear stress test - Primary    The main reason for his visit today was the fact that he had this high risk abnormal stress test. On my review, it really doesn't appear that much different than his previous stress test with the exception of the fact that there is no longer any lateral ischemia. Looking at his coronary anatomy, having a fixed inferior perfusion defect with mild ischemia is not unexpected in the fact that he has no RCA flow either from the native vessel or graft  This would leave his PDA and posterolateral system without primary blood flow.  There were no PCI options noted during his last catheterization it would help this area, but the lateral wall appears to be well perfused following most recent PCI. He is also totally asymptomatic. The only concerning thing for me is that the ejection fraction was read as being lower than before.  Plan: Check 2-D echocardiogram to get her better assessment of the EF. My suspicion is that his EF is probably in the 45% range based on what his last evaluation showed. If his EF is indeed down think we do need to proceed with cardiac Dictation, however if not would simply continue treating medically.      Relevant Orders   ECHOCARDIOGRAM COMPLETE   Atherosclerotic heart disease of artery bypass graft - occluded SVG-RCA and  occluded SVG-OM. Status post PCI to SVG-RI (Chronic)   Relevant Medications   hydrochlorothiazide (HYDRODIURIL) 25 MG tablet   telmisartan (MICARDIS) 80 MG tablet   Other Relevant Orders   ECHOCARDIOGRAM COMPLETE   CAD, CABG X 18 May 2010, progression at cath 04/02/12 -- status post PCI to SVG-RI & native Cx-OM. (Chronic)    I personally went back and reviewed his films. He does have significant coronary disease and occluded RCA that also does not have a vein graft to it. There is some collateralization from left to right. His last evaluation led to PCI of the proximal circumflex and OM1 as well as the anastomosis of SVG-RI. He continues to do well without any active symptoms despite having an abnormal Myoview.  I think the findings of his current Myoview are pretty much what would be expected based on his known anatomy with no native flow to the RCA. Comparing the images from his 2014 stress test now, the lateral wall seems better perfused. Plan for now is to check a 2-D echocardiogram to confirm his EF. If it is low, I think we may need to proceed with catheterization, if not, would simply follow up in 6 months      Relevant Medications   hydrochlorothiazide (HYDRODIURIL) 25 MG tablet   telmisartan (MICARDIS) 80 MG tablet   Cardiomyopathy, ischemic- moderate LVD (EF of roughly 45%) at cath 2/143 (Chronic)    He has had echocardiographic evidence of relatively normal cardiac function, but recent stress test that showed a reduced ejection fraction with most recent one showing a significantly reduced ejection fraction and 35% range. This may simply just be a factor of his prior infarct decreasing the effectiveness of EF evaluation on Myoview. Plan is check 2-D echocardiogram to get a better assessment of ejection fraction and wall motion changes. He is on a very stable regimen of max dose carvedilol, Micardis along with HCTZ.  No signs of heart failure, therefore not requiring full diuretic.  Relevant Medications   hydrochlorothiazide (HYDRODIURIL) 25 MG tablet   telmisartan (MICARDIS) 80 MG tablet   Other Relevant Orders   ECHOCARDIOGRAM COMPLETE   Essential hypertension (Chronic)    Blood pressure still remains high. Plan now is to increase Micardis to 160 mg daily. Next step would be likely adding spironolactone amlodipine.      Relevant Medications   hydrochlorothiazide (HYDRODIURIL) 25 MG tablet   telmisartan (MICARDIS) 80 MG tablet   Other Relevant Orders   ECHOCARDIOGRAM COMPLETE   Mild obesity (Chronic)    Continues to work on weight loss. I encouraged his continued exercise.      Peripheral arterial disease (HCC) (Chronic)    No limiting claudication symptoms. I feel pulses bilaterally. We can recheck Dopplers after his follow-up visit in October.      Relevant Medications   hydrochlorothiazide (HYDRODIURIL) 25 MG tablet   telmisartan (MICARDIS) 80 MG tablet      Current medicines are reviewed at length with the patient today. (+/- concerns) None The following changes have been made: None  Patient Instructions  MEDICATION CHANGE INCREASE MICARDIS TO 160 MG  ( TOTAL OF 2 TABLETS) DAILY  SCHEDULE AT [address]  Your physician has requested that you have an echocardiogram. Echocardiography is a painless test that uses sound waves to create images of your heart. It provides your doctor with information about the size and shape of your heart and how well your heart's chambers and valves are working. This procedure takes approximately one hour. There are no restrictions for this procedure.      KEEP APPOINTMENT - June 07 2016 IF ECHO IS NORMAL , WILL FOLLOW UP IN OCT 2018    Studies Ordered:   Orders Placed This Encounter  Procedures  . ECHOCARDIOGRAM COMPLETE     Glenetta Hew, M.D., M.S. Interventional Cardiologist   Pager # [phone] Phone # [phone] [address] [place], [place]  [address]

## 2016-05-16 ENCOUNTER — Telehealth: Payer: Self-pay | Admitting: Cardiology

## 2016-05-16 NOTE — Telephone Encounter (Signed)
New message   Pt c/o medication issue:  1. Name of Medication:telmisartan (MICARDIS) 80 MG tablet  2. How are you currently taking this medication (dosage and times per day)? 80mg   3. Are you having a reaction (difficulty breathing--STAT)? no  4. What is your medication issue? Pt wife wants to know how often he needs to take this medication and is confused about the duration

## 2016-05-16 NOTE — Telephone Encounter (Signed)
Spoke to wife - need clarification  telmisartan   patient is to take 2 ( 80 mg ) tablets daily to make total of 160 mg.  Medication was increased at last office visit  Verbalized understanding.

## 2016-05-28 ENCOUNTER — Other Ambulatory Visit: Payer: Self-pay

## 2016-05-28 ENCOUNTER — Ambulatory Visit (HOSPITAL_COMMUNITY): Payer: Medicare Other | Attending: Cardiology

## 2016-05-28 DIAGNOSIS — I255 Ischemic cardiomyopathy: Secondary | ICD-10-CM | POA: Diagnosis not present

## 2016-05-28 DIAGNOSIS — E119 Type 2 diabetes mellitus without complications: Secondary | ICD-10-CM | POA: Diagnosis not present

## 2016-05-28 DIAGNOSIS — I422 Other hypertrophic cardiomyopathy: Secondary | ICD-10-CM | POA: Insufficient documentation

## 2016-05-28 DIAGNOSIS — I10 Essential (primary) hypertension: Secondary | ICD-10-CM | POA: Insufficient documentation

## 2016-05-28 DIAGNOSIS — I25810 Atherosclerosis of coronary artery bypass graft(s) without angina pectoris: Secondary | ICD-10-CM | POA: Diagnosis not present

## 2016-05-28 DIAGNOSIS — I2510 Atherosclerotic heart disease of native coronary artery without angina pectoris: Secondary | ICD-10-CM | POA: Diagnosis present

## 2016-05-28 DIAGNOSIS — R9439 Abnormal result of other cardiovascular function study: Secondary | ICD-10-CM | POA: Insufficient documentation

## 2016-05-28 MED ORDER — PERFLUTREN LIPID MICROSPHERE
1.0000 mL | INTRAVENOUS | Status: AC | PRN
  Administered 2016-05-28: 2 mL via INTRAVENOUS

## 2016-05-29 ENCOUNTER — Telehealth: Payer: Self-pay | Admitting: *Deleted

## 2016-05-29 NOTE — Telephone Encounter (Signed)
Unable to talk at present. Will call back

## 2016-05-29 NOTE — Telephone Encounter (Signed)
-----   Message from Leonie Man, MD sent at 05/29/2016  8:33 AM EDT ----- Normal ejection fraction of 55-60%. Interesting, no wall motion abnormality. There is evidence of abnormal relaxation (grade 2 diastolic dysfunction). This would simply mean we need to monitor for blood pressure control This argues against the abnormal findings on Myoview of induced ejection fraction and wall motion abnormality.  This is more of what I was expecting from a ejection fraction, based upon his Sx.  [facility]

## 2016-05-29 NOTE — Telephone Encounter (Signed)
Spoke to patient and wife. Result given . Verbalized understanding

## 2016-06-07 ENCOUNTER — Ambulatory Visit: Payer: Medicare Other | Admitting: Cardiology

## 2016-08-27 ENCOUNTER — Other Ambulatory Visit: Payer: Self-pay | Admitting: Cardiology

## 2016-08-27 NOTE — Telephone Encounter (Signed)
Rx has been sent to the pharmacy electronically. ° °

## 2016-10-04 NOTE — Addendum Note (Signed)
Addendum  created 10/04/16 1018 by Roberts Gaudy, MD   Sign clinical note

## 2016-11-27 ENCOUNTER — Encounter: Payer: Self-pay | Admitting: Cardiology

## 2016-11-27 ENCOUNTER — Ambulatory Visit (INDEPENDENT_AMBULATORY_CARE_PROVIDER_SITE_OTHER): Payer: Medicare Other | Admitting: Cardiology

## 2016-11-27 VITALS — BP 158/78 | HR 64 | Ht 74.0 in | Wt 225.0 lb

## 2016-11-27 DIAGNOSIS — I10 Essential (primary) hypertension: Secondary | ICD-10-CM

## 2016-11-27 DIAGNOSIS — Z9861 Coronary angioplasty status: Secondary | ICD-10-CM

## 2016-11-27 DIAGNOSIS — E785 Hyperlipidemia, unspecified: Secondary | ICD-10-CM | POA: Diagnosis not present

## 2016-11-27 DIAGNOSIS — I255 Ischemic cardiomyopathy: Secondary | ICD-10-CM

## 2016-11-27 DIAGNOSIS — E669 Obesity, unspecified: Secondary | ICD-10-CM

## 2016-11-27 DIAGNOSIS — E1169 Type 2 diabetes mellitus with other specified complication: Secondary | ICD-10-CM | POA: Diagnosis not present

## 2016-11-27 DIAGNOSIS — I25708 Atherosclerosis of coronary artery bypass graft(s), unspecified, with other forms of angina pectoris: Secondary | ICD-10-CM | POA: Diagnosis not present

## 2016-11-27 DIAGNOSIS — I739 Peripheral vascular disease, unspecified: Secondary | ICD-10-CM

## 2016-11-27 DIAGNOSIS — I2510 Atherosclerotic heart disease of native coronary artery without angina pectoris: Secondary | ICD-10-CM

## 2016-11-27 DIAGNOSIS — R9439 Abnormal result of other cardiovascular function study: Secondary | ICD-10-CM | POA: Diagnosis not present

## 2016-11-27 MED ORDER — ATORVASTATIN CALCIUM 20 MG PO TABS: 20.0000 mg | ORAL_TABLET | Freq: Every day | ORAL | 3 refills | Status: DC

## 2016-11-27 MED ORDER — CLOPIDOGREL BISULFATE 75 MG PO TABS: 75.0000 mg | ORAL_TABLET | Freq: Every day | ORAL | 3 refills | Status: DC

## 2016-11-27 MED ORDER — AMLODIPINE BESYLATE 5 MG PO TABS: 5.0000 mg | ORAL_TABLET | Freq: Every day | ORAL | 3 refills | Status: DC

## 2016-11-27 NOTE — Patient Instructions (Addendum)
MEDICATION CHANGES  1-STOP BRILINTA AFTER YOU FINISH THIS CURRENT BOTTLE OF MEDICATION  THEN START CLOPIDOGREL 75 MG ONE TABLET DAILY.    DUE TO YOU STARTING  AMLODIPINE 5 MG ONE TABLET DAILY.    STOP SIMVASTATIN 40 MG DAILY AND START ATORVASTATIN 20 MG AT BEDTIME.       Your physician wants you to follow-up in [place]. You will receive a reminder letter in the mail two months in advance. If you don't receive a letter, please call our office to schedule the follow-up appointment.   If you need a refill on your cardiac medications before your next appointment, please call your pharmacy.

## 2016-11-27 NOTE — Progress Notes (Signed)
PCP: Neale Burly, MD  Clinic Note: Chief Complaint  Patient presents with  . Follow-up  . Coronary Artery Disease  . Hypertension    HPI: [name] is a 76 y.o. male with a PMH below who presents today for six-month follow-up for CAD- CABG. Despite having multivessel CAD and multiple bypasses as well as PCI's, the patient has never noted any chest tightness or pressure. He simply notes fatigue.  Initial cardiac evaluation was for preoperative risk stratification back in April 2012.-> Nuclear stress test was grossly abnormal and cardiac cath showed multivessel CAD he was referred for CABG.  2 years later abnormal stress test: Return the Cath Lab where he was found to have occluded SVG RCA as well as SVG-OM1-OM 2 --> PCI of native circumflex-OM1 and SVG-RI  [name] was last seen on May 14 2016 follow-up his stress test. This was read as a high risk study because of large area of infarct with peri-infarct ischemia in the inferior wall. EF is estimated at 34%. (This actually appeared to be much better than his 2014 images).  Simply because his lack of symptoms of chest tightness or pressure with rest or exertion, we decided to forego invasive evaluation.  Recent Hospitalizations: None  Studies Personally Reviewed - (if available, images/films reviewed: From Epic Chart or Care Everywhere)  2-D echo 05/29/2016: EF estimated 55-60% with GRD 2 DD. This was contrary to low we have noted on Myoview.  Interval History: As usual, [name] presents here today without any significant symptoms. He never really has had any symptoms of angina or chest tightness/pressure. Mostly when he notes his dyspnea or fatigue. He denies any of those symptoms either. He denies any PND, orthopnea or edema. He denies any rapid irregular heartbeats or palpitations. He denies any lightheadedness, dizziness, wooziness, syncope/near syncopal, or TIA/amaurosis fugax. Is very active, does exercise  routinely at the gym without any major issues. He exercises at least 20-30 minutes a day. The only time he notes feeling short of breath is if he leans down or bends down over his abdomen.  He denies any melena, hematochezia hematuria. No claudication. He denies any headache or blurred vision.   ROS: A comprehensive was performed. Review of Systems  Constitutional: Negative for malaise/fatigue and weight loss.  HENT: Negative for nosebleeds.   Respiratory: Negative for cough, shortness of breath and wheezing.   Gastrointestinal: Positive for heartburn (depending upon what he eats). Negative for abdominal pain and constipation.  Genitourinary: Negative for dysuria.  Musculoskeletal: Negative for back pain, falls, joint pain and myalgias.  Skin: Negative.   Neurological: Negative for dizziness and focal weakness.  Endo/Heme/Allergies: Negative for environmental allergies. Does not bruise/bleed easily.  Psychiatric/Behavioral: Negative for depression and memory loss. The patient is not nervous/anxious and does not have insomnia.   All other systems reviewed and are negative.   I have reviewed and (if needed) personally updated the patient's problem list, medications, allergies, past medical and surgical history, social and family history.   Past Medical History:  Diagnosis Date  . 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 Ellyn Hack (Cardiologist): For Abnormal ST: CABG X 5; cath 03/2012 --> LIMA-LAD - patent, SVG-Ramus - anastamotic 80% - DES stent; SVG-RCA & SVG-OM1-OM2-- occluded; DES Stent to Cx-OM1  . Cardiomyopathy, ischemic- moderate LVD (EF of roughly 45%) at cath 2/143 11/03/2012  . Carpal tunnel syndrome on right   . Chronic back pain    spinal stenosis and scoliosis  .  DDD (degenerative disc disease), lumbosacral   . Diabetes type 2, controlled ([place])    takes Metformin once a wk  . 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 Dyslipidemia    takes Simvastatin daily  .  First degree heart block   . GERD (gastroesophageal reflux disease)   . History of colon polyps    benign  . History of shingles    forehead --  dec 2016--  no residual pain  . Hypertension    takes Coreg,HCTZ,and Micardis daily  . Osteoarthritis   . Peripheral arterial disease [facility]) May 2016  (seen by dr berry)   L. ABI 0.58 with high grade L. SFA stenosis and occluded L. DP. R. ABI 0.48 with occluded R. SFA and occluded R. DP.  . Pneumonia    hx of-as a child  . Weakness    numbness and tingling    Past Surgical History:  Procedure Laterality Date  . CARDIAC CATHETERIZATION  05/15/2010   abnormal stress test-- 3 vessel CAD/  LVEDP=70mmHg  (Dr. Debara Pickett for Dr,. Ellyn Hack)   . CARPAL TUNNEL RELEASE Right 10/10/2015   Procedure: RIGHT HAND CARPAL TUNNEL RELEASE;  Surgeon: Iran Planas, MD;  Location: [place];  Service: Orthopedics;  Laterality: Right;  . COLONOSCOPY    . CORONARY ANGIOPLASTY     3 stents  . CORONARY ARTERY BYPASS GRAFT  05/22/2010  dr Lucianne Lei tright   x 5 (LIMA-LAD, SVG- RI,. SVG-OM1-OM2, SVG-dRCA)  . DECOMPRESSIVE LUMBAR LAMINECTOMY LEVEL 1 N/A 03/08/2016   Procedure: Lumbar 3-5 decompression with in situ fusion;  Surgeon: Melina Schools, MD;  Location: [place];  Service: Orthopedics;  Laterality: N/A;  . LEFT HEART CATHETERIZATION WITH CORONARY ANGIOGRAM N/A 04/02/2012   Procedure: LEFT HEART CATHETERIZATION WITH CORONARY ANGIOGRAM;  Surgeon: Leonie Man, MD;  Location: [facility] CATH LAB: Occluded SVG-rPDA & native RCA, 100% SVG-OM. Severe anastomotic lesion in SVG-RI.  Patent LIMA-LAD. Severe mid Cx-OM  . NM MYOVIEW LTD  03/25/2012   Dr. Ellyn Hack: High Risk exercise nuclear study w/ scar in the apex and upper mid to basal inferior wall w/ signgicnat additional iscemia inferiorly to lateral-inferiorly from apex to base/  severe LV dysfunction w/ infero-apical dyskinesis and hypocontractility in the inferior and lateral wall, ef 33%  . NM MYOVIEW LTD  04/2016    large-size, moderate intensity reversible perfusion defect in the inferior wall suggestive of ischemia. EF 34%. Global hypokinesis and severe inferior hypokinesis to akinesis. Also noted horizontal ST segment depression in inferior-lateralleads. HIGH RISK  . PERCUTANEOUS CORONARY STENT INTERVENTION (PCI-S) N/A 04/08/2012   Procedure: PERCUTANEOUS CORONARY STENT INTERVENTION (PCI-S);  Surgeon: Leonie Man, MD;  Location: [facility] CATH LAB: Promus Premier DES 2.25 mm x 12 mm - Mid Cx-OM1; Promus Premier DES 2.5 mm x 16 mm -- anastomotic SVG-RI (graft into native)  . TOTAL KNEE ARTHROPLASTY Right 10/26/2010  . TRANSTHORACIC ECHOCARDIOGRAM  08/21/2010   mild LVH, EF 45-50%, inferior hypokinesis,  grade 1 diastolic dysfunction/  moderate LAE/   mild MR/  trivial TR/  mild dilated RV  . TRANSTHORACIC ECHOCARDIOGRAM  05/2016   EF estimated 55-60% with GRD 2 DD. This was contrary to low we have noted on Myoview.  [facility] UMBILICAL HERNIA REPAIR  1980's    Current Meds  Medication Sig  . Ca Carbonate-Mag Hydroxide (ROLAIDS PO) Take 1 tablet by mouth 3 (three) times daily as needed.   . carvedilol (COREG) 25 MG tablet TAKE 1 TABLET (25  MG TOTAL) BY MOUTH 2 (TWO) TIMES DAILY.  . fish oil-omega-3 fatty acids 1000 MG capsule Take 1 g by mouth daily.   . hydrochlorothiazide (HYDRODIURIL) 25 MG tablet Take 25 mg by mouth daily.   . metFORMIN (GLUCOPHAGE) 500 MG tablet Take 500 mg by mouth daily with breakfast.  . methocarbamol (ROBAXIN) 500 MG tablet Take 1 tablet (500 mg total) by mouth 3 (three) times daily as needed for muscle spasms.  . nitroGLYCERIN (NITROSTAT) 0.4 MG SL tablet Place 1 tablet (0.4 mg total) under the tongue every 5 (five) minutes as needed for chest pain.  Marland Kitchen telmisartan (MICARDIS) 80 MG tablet Take 2 tablets (160 mg total) by mouth daily.  . [DISCONTINUED] BRILINTA 90 MG TABS tablet TAKE 1 TABLET BY MOUTH TWICE A DAY  . [DISCONTINUED] simvastatin (ZOCOR) 40 MG tablet TAKE 1 TABLET (40 MG TOTAL)  BY MOUTH EVERY EVENING.    Allergies  Allergen Reactions  . No Known Allergies     Social History   Social History  . Marital status: Married    Spouse name: N/A  . Number of children: N/A  . Years of education: N/A   Social History Main Topics  . Smoking status: Former Smoker    Types: Cigarettes    Quit date: 09/26/1980  . Smokeless tobacco: Never Used  . Alcohol use No  . Drug use: No  . Sexual activity: Not Asked   Other Topics Concern  . None   Social History Narrative   He is a married father of 2. He exercises routinely at the [facility].Marland Kitchen    He quit smoking about 30 years ago and has social alcohol.     family history includes Cancer in his mother.  Wt Readings from Last 3 Encounters:  11/27/16 225 lb (102.1 kg)  05/14/16 225 lb 3.2 oz (102.2 kg)  05/09/16 229 lb (103.9 kg)    PHYSICAL EXAM BP (!) 158/78   Pulse 64   Ht 6\' 2"  (1.88 m)   Wt 225 lb (102.1 kg)   BMI 28.89 kg/m  Physical Exam  Constitutional: He is oriented to person, place, and time. He appears well-developed and well-nourished. No distress.  Healthy-appearing. Well-groomed.  HENT:  Head: Normocephalic and atraumatic.  Mouth/Throat: No oropharyngeal exudate.  Neck: Normal range of motion. Neck supple. No hepatojugular reflux and no JVD present. Carotid bruit is not present.  Cardiovascular: Normal rate, regular rhythm, normal heart sounds and intact distal pulses.   Occasional extrasystoles are present. PMI is not displaced.  Exam reveals no gallop.   No murmur heard. Pulmonary/Chest: Effort normal and breath sounds normal. No respiratory distress. He has no wheezes. He has no rales. He exhibits no tenderness.  Abdominal: Soft. Bowel sounds are normal. He exhibits no distension. There is no tenderness. There is no rebound and no guarding.  Musculoskeletal: Normal range of motion. He exhibits no edema or deformity.  Neurological: He is alert and oriented to person, place, and time.  Skin:  Skin is warm and dry. No rash noted. No erythema.  Psychiatric: He has a normal mood and affect. His behavior is normal. Judgment and thought content normal.  Nursing note and vitals reviewed.   Adult ECG Report  Rate: 64 ;  Rhythm: normal sinus rhythm and Inferior MI, age undetermined.  Otherwise normal axis, intervals & durations;   Narrative Interpretation: stable EKG   Other studies Reviewed: Additional studies/ records that were reviewed today include:  Recent Labs:  Checked by PCP ~  3 months ago.   ASSESSMENT / PLAN: Problem List Items Addressed This Visit    Abnormal nuclear stress test   Relevant Orders   EKG 12-Lead (Completed)   Atherosclerotic heart disease of artery bypass graft - occluded SVG-RCA and occluded SVG-OM. Status post PCI to SVG-RI (Chronic)    No active symptoms. Unfortunately, his SVG-RCA did not seem to last, leaving the inferior wall to be perfused via collaterals -- likely explains the Myoview related ischemia -- the large defect noted prior to PCI is not present. At this point, I would only pursue invasive evaluation if he is noting symptoms of fatigue or DOE. Low EF on Myoview was refuted by Echo results.  Plan: continue carvedilol, telmisartan. -- add amlodipine (Norvasc) - convert statin to atorvastatin from simvastatin (b/c interacting with calcium channel blockers). Convert Brilinta to Plavix. (has been several years).      Relevant Medications   amLODipine (NORVASC) 5 MG tablet   atorvastatin (LIPITOR) 20 MG tablet   Other Relevant Orders   EKG 12-Lead (Completed)   CAD, CABG X 18 May 2010, progression at cath 04/02/12 -- status post PCI to SVG-RI & native Cx-OM. - Primary (Chronic)    Significant native CAD with occluded RCA that has been chronic as well as occluded vein graft to the RCA. He does have some atrophic collaterals which could explain the inferior wall ischemia noted on Myoview. He last had PCI of the proximal circumflex into the OM1  as well as the anastomosis of SVG-RI.  Despite having mild ischemia noted on Myoview, he continues to do well with no active anginal symptoms. I suspect that we will to do much about this inferior ischemia and therefore I would probably avoid doing frequent Myoview's on him.  We did check a recent echocardiogram to assess his EF based on the most recent stress test and it seemed relatively stable. He is on carvedilol, Micardis as well as simvastatin and Brilinta. Plan: Convert from Brilinta to Plavix once his current prescription is completed. Convert from simvastatin to atorvastatin as we will now add Norvasc.      Relevant Medications   amLODipine (NORVASC) 5 MG tablet   atorvastatin (LIPITOR) 20 MG tablet   Cardiomyopathy, ischemic- moderate LVD (EF of roughly 45%) at cath 2/143 (Chronic)    Echo EF refutes reduced EF noted on Myoview -- strangely, there was no comment about inferior hypokinesis which I would have thought to be present.  On stable dose of Beta Blocker & ARB -- (both essentially at Max dose) -- will add amlodipine 2. 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 - No loop diuretic required.      Relevant Medications   amLODipine (NORVASC) 5 MG tablet   atorvastatin (LIPITOR) 20 MG tablet   Other Relevant Orders   EKG 12-Lead (Completed)   Dyslipidemia associated with type 2 diabetes mellitus (HCC) (Chronic)    Currently on simvastatin -- will convert to atorvastatin to avoid interaction with amlodipine - start @ 20 mg, but anticipate titrating up to 40 mg if tolerated. Need labs.      Relevant Medications   atorvastatin (LIPITOR) 20 MG tablet   Essential hypertension (Chronic)    Already on 160 mg Micardis (telmisartan) & carvedilol + HCTZ.   Unfortunately, we now need to add another medications -- amlodipine 2.5 mg       Relevant Medications   amLODipine (NORVASC) 5 MG tablet   atorvastatin (LIPITOR) 20 MG tablet   Mild obesity (  Chronic)    The patient understands  the need to lose weight with diet and exercise. We have discussed specific strategies for this.      Peripheral arterial disease (HCC) (Chronic)    No limiting claudication Symptoms. Can hold off on follow-up dopplers until next visit.      Relevant Medications   amLODipine (NORVASC) 5 MG tablet   atorvastatin (LIPITOR) 20 MG tablet   S/P elective  PTCA / DES to SVG-RI and OM1 04/08/12 (Chronic)    On Brilinta alone - OK to convert to Plavix. --Rx provided.         Current medicines are reviewed at length with the patient today. (+/- concerns) ? How lon to stay on Brilinta The following changes have been made: Add Norvasc 2.5 mg  Patient Instructions  MEDICATION CHANGES  1-STOP BRILINTA AFTER YOU FINISH THIS CURRENT BOTTLE OF MEDICATION  THEN START CLOPIDOGREL 75 MG ONE TABLET DAILY.    DUE TO YOU STARTING  AMLODIPINE 5 MG ONE TABLET DAILY.    STOP SIMVASTATIN 40 MG DAILY AND START ATORVASTATIN 20 MG AT BEDTIME.       Your physician wants you to follow-up in [place]. You will receive a reminder letter in the mail two months in advance. If you don't receive a letter, please call our office to schedule the follow-up appointment.   If you need a refill on your cardiac medications before your next appointment, please call your pharmacy.    Studies Ordered:   Orders Placed This Encounter  Procedures  . EKG 12-Lead      Glenetta Hew, M.D., M.S. Interventional Cardiologist   Pager # [phone] Phone # [phone] [address] [place], [place] [address]

## 2016-11-29 ENCOUNTER — Encounter: Payer: Self-pay | Admitting: Cardiology

## 2016-11-29 ENCOUNTER — Ambulatory Visit: Payer: Medicare Other | Admitting: Cardiology

## 2016-11-29 NOTE — Assessment & Plan Note (Signed)
On Brilinta alone - OK to convert to Plavix. --Rx provided.

## 2016-11-29 NOTE — Assessment & Plan Note (Signed)
Significant native CAD with occluded RCA that has been chronic as well as occluded vein graft to the RCA. He does have some atrophic collaterals which could explain the inferior wall ischemia noted on Myoview. He last had PCI of the proximal circumflex into the OM1 as well as the anastomosis of SVG-RI.  Despite having mild ischemia noted on Myoview, he continues to do well with no active anginal symptoms. I suspect that we will to do much about this inferior ischemia and therefore I would probably avoid doing frequent Myoview's on him.  We did check a recent echocardiogram to assess his EF based on the most recent stress test and it seemed relatively stable. He is on carvedilol, Micardis as well as simvastatin and Brilinta. Plan: Convert from Brilinta to Plavix once his current prescription is completed. Convert from simvastatin to atorvastatin as we will now add Norvasc.

## 2016-11-29 NOTE — Assessment & Plan Note (Signed)
Echo EF refutes reduced EF noted on Myoview -- strangely, there was no comment about inferior hypokinesis which I would have thought to be present.  On stable dose of Beta Blocker & ARB -- (both essentially at Max dose) -- will add amlodipine 2. 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 - No loop diuretic required.

## 2016-11-29 NOTE — Assessment & Plan Note (Signed)
Currently on simvastatin -- will convert to atorvastatin to avoid interaction with amlodipine - start @ 20 mg, but anticipate titrating up to 40 mg if tolerated. Need labs.

## 2016-11-29 NOTE — Assessment & Plan Note (Addendum)
No active symptoms. Unfortunately, his SVG-RCA did not seem to last, leaving the inferior wall to be perfused via collaterals -- likely explains the Myoview related ischemia -- the large defect noted prior to PCI is not present. At this point, I would only pursue invasive evaluation if he is noting symptoms of fatigue or DOE. Low EF on Myoview was refuted by Echo results.  Plan: continue carvedilol, telmisartan. -- add amlodipine (Norvasc) - convert statin to atorvastatin from simvastatin (b/c interacting with calcium channel blockers). Convert Brilinta to Plavix. (has been several years).

## 2016-11-29 NOTE — Assessment & Plan Note (Signed)
Already on 160 mg Micardis (telmisartan) & carvedilol + HCTZ.   Unfortunately, we now need to add another medications -- amlodipine 2.5 mg

## 2016-11-29 NOTE — Assessment & Plan Note (Signed)
The patient understands the need to lose weight with diet and exercise. We have discussed specific strategies for this.  

## 2016-11-29 NOTE — Assessment & Plan Note (Signed)
No limiting claudication Symptoms. Can hold off on follow-up dopplers until next visit.

## 2016-12-31 ENCOUNTER — Other Ambulatory Visit: Payer: Self-pay | Admitting: Cardiology

## 2017-03-27 ENCOUNTER — Other Ambulatory Visit: Payer: Self-pay | Admitting: Cardiovascular Disease

## 2017-06-13 ENCOUNTER — Other Ambulatory Visit: Payer: Self-pay | Admitting: Cardiology

## 2017-06-13 NOTE — Telephone Encounter (Signed)
REFILL 

## 2017-09-04 ENCOUNTER — Other Ambulatory Visit: Payer: Self-pay | Admitting: Cardiology

## 2017-09-04 NOTE — Telephone Encounter (Signed)
Rx request sent to pharmacy.  

## 2017-12-03 ENCOUNTER — Other Ambulatory Visit: Payer: Self-pay | Admitting: Cardiology

## 2017-12-03 ENCOUNTER — Encounter: Payer: Self-pay | Admitting: Cardiology

## 2017-12-03 ENCOUNTER — Ambulatory Visit: Payer: Medicare Other | Admitting: Cardiology

## 2017-12-03 VITALS — BP 142/68 | HR 64 | Ht 74.0 in | Wt 219.8 lb

## 2017-12-03 DIAGNOSIS — Z955 Presence of coronary angioplasty implant and graft: Secondary | ICD-10-CM

## 2017-12-03 DIAGNOSIS — I255 Ischemic cardiomyopathy: Secondary | ICD-10-CM | POA: Diagnosis not present

## 2017-12-03 DIAGNOSIS — I2510 Atherosclerotic heart disease of native coronary artery without angina pectoris: Secondary | ICD-10-CM | POA: Diagnosis not present

## 2017-12-03 DIAGNOSIS — I739 Peripheral vascular disease, unspecified: Secondary | ICD-10-CM

## 2017-12-03 DIAGNOSIS — E1169 Type 2 diabetes mellitus with other specified complication: Secondary | ICD-10-CM

## 2017-12-03 DIAGNOSIS — E669 Obesity, unspecified: Secondary | ICD-10-CM

## 2017-12-03 DIAGNOSIS — I10 Essential (primary) hypertension: Secondary | ICD-10-CM

## 2017-12-03 DIAGNOSIS — E785 Hyperlipidemia, unspecified: Secondary | ICD-10-CM

## 2017-12-03 MED ORDER — AMLODIPINE BESYLATE 10 MG PO TABS: 10.0000 mg | ORAL_TABLET | Freq: Every day | ORAL | 3 refills | Status: DC

## 2017-12-03 MED ORDER — FUROSEMIDE 20 MG PO TABS: 20.0000 mg | ORAL_TABLET | Freq: Every day | ORAL | 3 refills | Status: DC | PRN

## 2017-12-03 NOTE — Progress Notes (Signed)
PCP: Neale Burly, MD  Clinic Note: Chief Complaint  Patient presents with  . Follow-up    Annual  . Coronary Artery Disease    Somewhat stuffy without any significant symptoms despite having abnormal stress tests and multivessel CAD.  Marland Kitchen Foot Pain    Left foot and toe    HPI: [name] is a 77 y.o. male with a PMH below who presents today for six-month follow-up for CAD- CABG. Despite having multivessel CAD and multiple bypasses as well as PCI's, the patient has never noted any chest tightness or pressure. He simply notes fatigue.  Initial cardiac evaluation was for preoperative risk stratification back in April 2012.-> Nuclear stress test was grossly abnormal and cardiac cath showed multivessel CAD he was referred for CABG.  2 years later - Abnormal stress test: Return the Cath Lab where he was found to have occluded SVG RCA as well as SVG-OM1-OM 2 --> PCI of native circumflex-OM1 and SVG-RI  Myoview 04/2016 - read as HIGH RISK b/c reduced LVEF -- Echo confirmed relatively Normal EF. -- checked Echo.  No ischemia - Med Rx.  2-D echo 05/29/2016: EF estimated 55-60% with GRD 2 DD. This was contrary to low we have noted on Myoview.  [name] was last seen on November 27, 2016 doing very well from a cardiac standpoint.  Again he always denies angina or chest discomfort that with rest or exertion.  No significant fatigue.  No claudication.  No CHF symptoms.  Was happy to avoid any further evaluation based on his echo and Myoview.  Continued to be very active, does exercise routinely at the gym without any major issues. He exercises at least 20-30 minutes a day.   Recent Hospitalizations: None  Studies Personally Reviewed - (if available, images/films reviewed: From Epic Chart or Care Everywhere)    Interval History: As usual, [name] returns here today doing well without any major complaints.  He thinks he may have a little of gout in his left foot with pain and that  kept him from walking as much as he usually does, but otherwise he has been doing fine without any cardiac complaints of resting or exertional dyspnea or chest pain.  No PND, or orthopnea with mild end of day edema that really does not bother him that much.  Is really only in the left leg that bothers him from edema standpoint.  He still exercises at the gym and also does routine walking.  Has not really changed his routine except recently with his left foot hurting.  Certainly he gets short of breath if he overexerts himself, but not with routine exercise. He denies any rapid irregular heartbeats palpitations.  Just a few flip-flops beats.  No syncope/near syncope or TIA/amaurosis fugax.  No claudication.  He has been   ROS: A comprehensive was performed. Review of Systems  Constitutional: Negative for malaise/fatigue and weight loss.  HENT: Negative for nosebleeds.   Respiratory: Negative for cough, shortness of breath and wheezing.   Gastrointestinal: Positive for heartburn (depending upon what he eats). Negative for abdominal pain and constipation.  Genitourinary: Negative for dysuria.  Musculoskeletal: Positive for joint pain (Left great toe and ankle). Negative for back pain, falls and myalgias.  Skin: Negative.   Neurological: Negative for dizziness and focal weakness.  Endo/Heme/Allergies: Negative for environmental allergies. Does not bruise/bleed easily.  Psychiatric/Behavioral: Negative for depression and memory loss. The patient is not nervous/anxious and does not have insomnia.   All  other systems reviewed and are negative.   I have reviewed and (if needed) personally updated the patient's problem list, medications, allergies, past medical and surgical history, social and family history.   Past Medical History:  Diagnosis Date  . 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 Ellyn Hack (Cardiologist): For Abnormal ST: CABG X 5; cath 03/2012 --> LIMA-LAD - patent, SVG-Ramus -  anastamotic 80% - DES stent; SVG-RCA & SVG-OM1-OM2-- occluded; DES Stent to Cx-OM1  . Cardiomyopathy, ischemic- moderate LVD (EF of roughly 45%) at cath 2/143 11/03/2012  . Carpal tunnel syndrome on right   . Chronic back pain    spinal stenosis and scoliosis  . DDD (degenerative disc disease), lumbosacral   . Diabetes type 2, controlled ([place])    takes Metformin once a wk  . 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 Dyslipidemia    takes Simvastatin daily  . First degree heart block   . GERD (gastroesophageal reflux disease)   . History of colon polyps    benign  . History of shingles    forehead --  dec 2016--  no residual pain  . Hypertension    takes Coreg,HCTZ,and Micardis daily  . Osteoarthritis   . Peripheral arterial disease [facility]) May 2016  (seen by dr berry)   L. ABI 0.58 with high grade L. SFA stenosis and occluded L. DP. R. ABI 0.48 with occluded R. SFA and occluded R. DP.  . Pneumonia    hx of-as a child  . Weakness    numbness and tingling    Past Surgical History:  Procedure Laterality Date  . CARDIAC CATHETERIZATION  05/15/2010   abnormal stress test-- 3 vessel CAD/  LVEDP=27mmHg  (Dr. Debara Pickett for Dr,. Ellyn Hack)   . CARPAL TUNNEL RELEASE Right 10/10/2015   Procedure: RIGHT HAND CARPAL TUNNEL RELEASE;  Surgeon: Iran Planas, MD;  Location: [place];  Service: Orthopedics;  Laterality: Right;  . COLONOSCOPY    . CORONARY ANGIOPLASTY     3 stents  . CORONARY ARTERY BYPASS GRAFT  05/22/2010  dr Lucianne Lei tright   x 5 (LIMA-LAD, SVG- RI,. SVG-OM1-OM2, SVG-dRCA)  . DECOMPRESSIVE LUMBAR LAMINECTOMY LEVEL 1 N/A 03/08/2016   Procedure: Lumbar 3-5 decompression with in situ fusion;  Surgeon: Melina Schools, MD;  Location: [place];  Service: Orthopedics;  Laterality: N/A;  . LEFT HEART CATHETERIZATION WITH CORONARY ANGIOGRAM N/A 04/02/2012   Procedure: LEFT HEART CATHETERIZATION WITH CORONARY ANGIOGRAM;  Surgeon: Leonie Man, MD;  Location: [facility] CATH LAB:  Occluded SVG-rPDA & native RCA, 100% SVG-OM. Severe anastomotic lesion in SVG-RI.  Patent LIMA-LAD. Severe mid Cx-OM  . NM MYOVIEW LTD  03/25/2012   Dr. Ellyn Hack: High Risk exercise nuclear study w/ scar in the apex and upper mid to basal inferior wall w/ signgicnat additional iscemia inferiorly to lateral-inferiorly from apex to base/  severe LV dysfunction w/ infero-apical dyskinesis and hypocontractility in the inferior and lateral wall, ef 33%  . NM MYOVIEW LTD  04/2016   large-size, moderate intensity reversible perfusion defect in the inferior wall suggestive of ischemia. EF 34%. Global hypokinesis and severe inferior hypokinesis to akinesis. Also noted horizontal ST segment depression in inferior-lateralleads. HIGH RISK  . PERCUTANEOUS CORONARY STENT INTERVENTION (PCI-S) N/A 04/08/2012   Procedure: PERCUTANEOUS CORONARY STENT INTERVENTION (PCI-S);  Surgeon: Leonie Man, MD;  Location: [facility] CATH LAB: Promus Premier DES 2.25 mm x 12 mm - Mid Cx-OM1; Promus Premier DES 2.5 mm x 16 mm -- anastomotic  SVG-RI (graft into native)  . TOTAL KNEE ARTHROPLASTY Right 10/26/2010  . TRANSTHORACIC ECHOCARDIOGRAM  08/21/2010   mild LVH, EF 45-50%, inferior hypokinesis,  grade 1 diastolic dysfunction/  moderate LAE/   mild MR/  trivial TR/  mild dilated RV  . TRANSTHORACIC ECHOCARDIOGRAM  05/2016   EF estimated 55-60% with GRD 2 DD. This was contrary to low we have noted on Myoview.  Marland Kitchen UMBILICAL HERNIA REPAIR  1980's    Current Meds  Medication Sig  . atorvastatin (LIPITOR) 20 MG tablet Take 20 mg by mouth daily.  . Ca Carbonate-Mag Hydroxide (ROLAIDS PO) Take 1 tablet by mouth 3 (three) times daily as needed.   . carvedilol (COREG) 25 MG tablet TAKE 1 TABLET (25 MG TOTAL) BY MOUTH 2 (TWO) TIMES DAILY.  Marland Kitchen clopidogrel (PLAVIX) 75 MG tablet Take 1 tablet (75 mg total) by mouth daily.  . fish oil-omega-3 fatty acids 1000 MG capsule Take 1 g by mouth daily.   . metFORMIN (GLUCOPHAGE) 500 MG tablet Take 500 mg  by mouth daily with breakfast.  . nitroGLYCERIN (NITROSTAT) 0.4 MG SL tablet Place 1 tablet (0.4 mg total) under the tongue every 5 (five) minutes as needed for chest pain.  . simvastatin (ZOCOR) 40 MG tablet TAKE 1 TABLET (40 MG TOTAL) BY MOUTH EVERY EVENING.  . [DISCONTINUED] amLODipine (NORVASC) 5 MG tablet Take 5 mg by mouth daily.  . [DISCONTINUED] hydrochlorothiazide (HYDRODIURIL) 25 MG tablet Take 25 mg by mouth daily.   . [DISCONTINUED] telmisartan (MICARDIS) 80 MG tablet TAKE 2 TABLETS BY MOUTH DAILY    Allergies  Allergen Reactions  . No Known Allergies    Social History   Tobacco Use  . Smoking status: Former Smoker    Types: Cigarettes    Last attempt to quit: 09/26/1980    Years since quitting: 37.2  . Smokeless tobacco: Never Used  Substance Use Topics  . Alcohol use: No  . Drug use: No   Social History   Social History Narrative   He is a married father of 2. He exercises routinely at the [facility].Marland Kitchen    He quit smoking about 30 years ago and has social alcohol.    Family History family history includes Cancer in his mother.  Wt Readings from Last 3 Encounters:  12/03/17 219 lb 12.8 oz (99.7 kg)  11/27/16 225 lb (102.1 kg)  05/14/16 225 lb 3.2 oz (102.2 kg)    PHYSICAL EXAM BP (!) 142/68   Pulse 64   Ht 6\' 2"  (1.88 m)   Wt 219 lb 12.8 oz (99.7 kg)   BMI 28.22 kg/m   Physical Exam  Constitutional: He is oriented to person, place, and time. He appears well-developed and well-nourished. No distress.  Healthy-appearing. Well-groomed.  HENT:  Head: Normocephalic and atraumatic.  Mouth/Throat: Oropharynx is clear and moist.  Neck: Normal range of motion. Neck supple. No hepatojugular reflux and no JVD present. Carotid bruit is not present. No thyromegaly present.  Cardiovascular: Normal rate, regular rhythm, normal heart sounds and intact distal pulses.  Occasional extrasystoles are present. PMI is not displaced. Exam reveals no gallop.  No murmur  heard. Pulmonary/Chest: Effort normal and breath sounds normal. No respiratory distress. He has no wheezes. He has no rales. He exhibits no tenderness.  Abdominal: Soft. Bowel sounds are normal. He exhibits no distension. There is no tenderness. There is no rebound and no guarding.  Musculoskeletal: Normal range of motion. He exhibits no edema or deformity.  Lymphadenopathy:  He has no cervical adenopathy.  Neurological: He is alert and oriented to person, place, and time.  Skin: No erythema.  Psychiatric: He has a normal mood and affect. His behavior is normal. Judgment and thought content normal.  Nursing note and vitals reviewed.   Adult ECG Report  Rate: 64 ;  Rhythm: normal sinus rhythm and Inferior MI, age undetermined.  Otherwise normal axis, intervals & durations;   Narrative Interpretation: stable EKG   Other studies Reviewed: Additional studies/ records that were reviewed today include:  Recent Labs:  Checked by PCP ~3 months ago. No results found for: CHOL, HDL, LDLCALC, LDLDIRECT, TRIG, CHOLHDL  ASSESSMENT / PLAN: Problem List Items Addressed This Visit    CAD, CABG X 18 May 2010, progression at cath 04/02/12 -- status post PCI to SVG-RI & native Cx-OM. - Primary (Chronic)    He does have significant native CAD with occluded vein graft as well to the chronically occluded RCA.  This probably explains the inferior defect on Myoview. He is now relatively asymptomatic after PCI to this circumflex into OM1 and the anastomotic segment of SVG-RI.  We have talked about whether or not we will do another stress test.  I am a little bit concerned because we have always had abnormal findings, but still think is not unreasonable to check one in May 2022 for surveillance mostly because he has not really had symptoms as warning signs.  Would probably also check echocardiogram at that time just to confirm EF.      Relevant Medications   atorvastatin (LIPITOR) 20 MG tablet   furosemide  (LASIX) 20 MG tablet   amLODipine (NORVASC) 10 MG tablet   Other Relevant Orders   EKG 12-Lead (Completed)   Cardiomyopathy, ischemic-EF improved from 45% post CABG up to 55-60% (Chronic)    Prevention relatively normal echo back in April 2018.  No heart failure symptoms.  Only left foot edema. Because of concern for gout we will get DC HCTZ and just use PRN 20 mg Lasix.  He is on max dose carvedilol, high-dose Micardis and we are increasing amlodipine 10 mg.      Relevant Medications   atorvastatin (LIPITOR) 20 MG tablet   furosemide (LASIX) 20 MG tablet   amLODipine (NORVASC) 10 MG tablet   Other Relevant Orders   EKG 12-Lead (Completed)   Dyslipidemia associated with type 2 diabetes mellitus (HCC) (Chronic)    Converted from simvastatin to atorvastatin because of amlodipine.  Looking at his labs from February 2019, would probably want to be a little more aggressive.  Can increase him to 40 mg Lipitor.  Do not have labs from PCP unfortunately.      Relevant Medications   atorvastatin (LIPITOR) 20 MG tablet   Essential hypertension (Chronic)    Blood pressure is a little bit high today --> also concern for possible gout in left foot.  Plan: DC HCTZ and use PRN Lasix 20 mg  Titrate Norvasc up to 10 mg daily  Continue max dose carvedilol and 160 mg of Micardis      Relevant Medications   atorvastatin (LIPITOR) 20 MG tablet   furosemide (LASIX) 20 MG tablet   amLODipine (NORVASC) 10 MG tablet   Mild obesity (Chronic)    He has now dropped 6 pounds from last year.  Consistent with increased exercise.  Now he does need to have him work on his diet while he continues his exercise.  Hopefully he can lose another 6 to 54  pounds before I see him back in a year.        Peripheral arterial disease (HCC) (Chronic)    No active claudication.  His left foot pain is probably more related to gout.      Relevant Medications   atorvastatin (LIPITOR) 20 MG tablet   furosemide (LASIX) 20  MG tablet   amLODipine (NORVASC) 10 MG tablet   Presence of drug coated stent in left circumflex coronary artery; Promus Premier 2.25 mm x 12 mm (Chronic)    Mostly because of his significant disease and now having a stent we are using maintenance clopidogrel 75 mg daily.  No aspirin.  Okay to hold clopidogrel for procedures, but just restart after.  Is also on statin which we can increase to 40 mg daily.         Current medicines are reviewed at length with the patient today. (+/- concerns) ?  Left foot pain The following changes have been made: .  Titrate Norvasc  Patient Instructions  Medication Instructions:  STOP TAKING HCTZ  START LASIX 20 MG - MAY USE AS NEEDED FOR SWELLING  INCREASE TAKING AMLOPIDINE 10 MG  ONE TABLET DAILY   If you need a refill on your cardiac medications before your next appointment, please call your pharmacy.   Lab work: NOT NEEDED If you have labs (blood work) drawn today and your tests are completely normal, you will receive your results only by: Marland Kitchen MyChart Message (if you have MyChart) OR . A paper copy in the mail If you have any lab test that is abnormal or we need to change your treatment, we will call you to review the results.  Testing/Procedures: NOT NEEDED  Follow-Up: At [facility], you and your health needs are our priority.  As part of our continuing mission to provide you with exceptional heart care, we have created designated Provider Care Teams.  These Care Teams include your primary Cardiologist (physician) and Advanced Practice Providers (APPs -  Physician Assistants and Nurse Practitioners) who all work together to provide you with the care you need, when you need it. You will need a follow up appointment in 12 months.  Please call our office 2 months in advance to schedule this appointment.  You may see Glenetta Hew, MD or one of the following Advanced Practice Providers on your designated Care Team:   Rosaria Ferries,  PA-C . Jory Sims, DNP, ANP  Any Other Special Instructions Will Be Listed Below (If Applicable).  DISCUSS WITH PRIMARY  ABOUT FOOT AND ANKLE PAIN    Studies Ordered:   Orders Placed This Encounter  Procedures  . EKG 12-Lead      Glenetta Hew, M.D., M.S. Interventional Cardiologist   Pager # [phone] Phone # [phone] [address] [place], [place] [address]

## 2017-12-03 NOTE — Patient Instructions (Signed)
Medication Instructions:  STOP TAKING HCTZ  START LASIX 20 MG - MAY USE AS NEEDED FOR SWELLING  INCREASE TAKING AMLOPIDINE 10 MG  ONE TABLET DAILY   If you need a refill on your cardiac medications before your next appointment, please call your pharmacy.   Lab work: NOT NEEDED If you have labs (blood work) drawn today and your tests are completely normal, you will receive your results only by: Marland Kitchen MyChart Message (if you have MyChart) OR . A paper copy in the mail If you have any lab test that is abnormal or we need to change your treatment, we will call you to review the results.  Testing/Procedures: NOT NEEDED  Follow-Up: At [facility], you and your health needs are our priority.  As part of our continuing mission to provide you with exceptional heart care, we have created designated Provider Care Teams.  These Care Teams include your primary Cardiologist (physician) and Advanced Practice Providers (APPs -  Physician Assistants and Nurse Practitioners) who all work together to provide you with the care you need, when you need it. You will need a follow up appointment in 12 months.  Please call our office 2 months in advance to schedule this appointment.  You may see Glenetta Hew, MD or one of the following Advanced Practice Providers on your designated Care Team:   Rosaria Ferries, PA-C . Jory Sims, DNP, ANP  Any Other Special Instructions Will Be Listed Below (If Applicable).  DISCUSS WITH PRIMARY  ABOUT FOOT AND ANKLE PAIN

## 2017-12-03 NOTE — Telephone Encounter (Signed)
Rx request sent to pharmacy.  

## 2017-12-09 ENCOUNTER — Encounter: Payer: Self-pay | Admitting: Cardiology

## 2017-12-09 NOTE — Assessment & Plan Note (Signed)
No active claudication.  His left foot pain is probably more related to gout.

## 2017-12-09 NOTE — Assessment & Plan Note (Signed)
Converted from simvastatin to atorvastatin because of amlodipine.  Looking at his labs from February 2019, would probably want to be a little more aggressive.  Can increase him to 40 mg Lipitor.  Do not have labs from PCP unfortunately.

## 2017-12-09 NOTE — Assessment & Plan Note (Addendum)
Blood pressure is a little bit high today --> also concern for possible gout in left foot.  Plan: DC HCTZ and use PRN Lasix 20 mg  Titrate Norvasc up to 10 mg daily  Continue max dose carvedilol and 160 mg of Micardis

## 2017-12-09 NOTE — Assessment & Plan Note (Signed)
Mostly because of his significant disease and now having a stent we are using maintenance clopidogrel 75 mg daily.  No aspirin.  Okay to hold clopidogrel for procedures, but just restart after.  Is also on statin which we can increase to 40 mg daily.

## 2017-12-09 NOTE — Assessment & Plan Note (Signed)
He has now dropped 6 pounds from last year.  Consistent with increased exercise.  Now he does need to have him work on his diet while he continues his exercise.  Hopefully he can lose another 6 to 12 pounds before I see him back in a year.

## 2017-12-09 NOTE — Assessment & Plan Note (Signed)
He does have significant native CAD with occluded vein graft as well to the chronically occluded RCA.  This probably explains the inferior defect on Myoview. He is now relatively asymptomatic after PCI to this circumflex into OM1 and the anastomotic segment of SVG-RI.  We have talked about whether or not we will do another stress test.  I am a little bit concerned because we have always had abnormal findings, but still think is not unreasonable to check one in May 2022 for surveillance mostly because he has not really had symptoms as warning signs.  Would probably also check echocardiogram at that time just to confirm EF.

## 2017-12-09 NOTE — Assessment & Plan Note (Signed)
Prevention relatively normal echo back in April 2018.  No heart failure symptoms.  Only left foot edema. Because of concern for gout we will get DC HCTZ and just use PRN 20 mg Lasix.  He is on max dose carvedilol, high-dose Micardis and we are increasing amlodipine 10 mg.

## 2017-12-15 ENCOUNTER — Other Ambulatory Visit: Payer: Self-pay | Admitting: Cardiology

## 2017-12-16 NOTE — Telephone Encounter (Signed)
Rx(s) sent to pharmacy electronically.  

## 2017-12-17 ENCOUNTER — Other Ambulatory Visit: Payer: Self-pay | Admitting: Cardiovascular Disease

## 2018-01-08 ENCOUNTER — Other Ambulatory Visit: Payer: Self-pay | Admitting: Cardiology

## 2018-03-07 ENCOUNTER — Encounter: Payer: Self-pay | Admitting: Cardiology

## 2018-03-07 ENCOUNTER — Ambulatory Visit: Payer: Medicare Other | Admitting: Cardiology

## 2018-03-07 VITALS — BP 154/74 | HR 67 | Ht 74.0 in | Wt 223.6 lb

## 2018-03-07 DIAGNOSIS — I2510 Atherosclerotic heart disease of native coronary artery without angina pectoris: Secondary | ICD-10-CM

## 2018-03-07 DIAGNOSIS — I25708 Atherosclerosis of coronary artery bypass graft(s), unspecified, with other forms of angina pectoris: Secondary | ICD-10-CM

## 2018-03-07 DIAGNOSIS — I255 Ischemic cardiomyopathy: Secondary | ICD-10-CM

## 2018-03-07 DIAGNOSIS — R55 Syncope and collapse: Secondary | ICD-10-CM

## 2018-03-07 DIAGNOSIS — I10 Essential (primary) hypertension: Secondary | ICD-10-CM

## 2018-03-07 DIAGNOSIS — I739 Peripheral vascular disease, unspecified: Secondary | ICD-10-CM | POA: Diagnosis not present

## 2018-03-07 DIAGNOSIS — E118 Type 2 diabetes mellitus with unspecified complications: Secondary | ICD-10-CM

## 2018-03-07 MED ORDER — HYDROCHLOROTHIAZIDE 12.5 MG PO CAPS: 12.5000 mg | ORAL_CAPSULE | Freq: Every day | ORAL | 6 refills | Status: DC

## 2018-03-07 NOTE — Progress Notes (Signed)
PCP: Neale Burly, MD  Clinic Note: Chief Complaint  Patient presents with  . Hospitalization Follow-up    [facility]  . Loss of Consciousness    While at church  . Coronary Artery Disease    No angina    HPI: MOE GRACA is a 78 y.o. male with a PMH below who presents today for six-month follow-up for CAD- CABG. Despite having multivessel CAD and multiple bypasses as well as PCI's, the patient has never noted any chest tightness or pressure. He simply notes fatigue.  Initial cardiac evaluation was for preoperative risk stratification back in April 2012.-> Nuclear stress test was grossly abnormal and cardiac cath showed multivessel CAD he was referred for CABG.  2 years later - Abnormal stress test: Return the Cath Lab where he was found to have occluded SVG RCA as well as SVG-OM1-OM 2 --> PCI of native circumflex-OM1 and SVG-RI  Myoview 04/2016 - read as HIGH RISK b/c reduced LVEF -- Echo confirmed relatively Normal EF. -- checked Echo.  No ischemia - Med Rx.  2-D echo 05/29/2016: EF estimated 55-60% with GRD 2 DD. This was contrary to low we have noted on Myoview.  [name] was last seen on December 03, 2017 -no major cardiac complaints.  No angina or heart failure.  Somewhat limited because of a recent gout flare.  No significant fatigue, just little bit of dyspnea with rapid exertion.  Continued to be very active, does exercise routinely at the gym without any major issues. He exercises at least 20-30 minutes a day.   Recent Hospitalizations:   Recently admitted to [place] [place] this past weekend with syncope. ->  Had a CT scan and MRI but no echo.  Studies Personally Reviewed - (if available, images/films reviewed: From Epic Chart or Care Everywhere)  No new studies available.  Interval History: Luiz Ochoa presents here today to follow-up his hospitalization.  This past weekend he was at church and otherwise feeling okay but then he started  breaking out into a hot sweat and felt lightheaded and woozy.  He then apparently passed out losing consciousness for a minute or 2.  Friend there that is a paramedic with some cold water on his face and loosen up his clothing, got him into a cool area and he was easily aroused.  He went to the hospital and was spent the the evening overnight be monitored.  They did not see anything on telemetry.  They also did a CT and MRI and ruled out stroke.  His PCP suggested that he see his cardiologist in order to consider event monitor and echo.  Since that episode, he has been doing fine no further episodes or symptoms.  He went to the gym on Thursday and worked out as usual with no issues.  He did take Tuesday off.  When asked about his episode this weekend, he did not acknowledge any change to his normal routine.  He did not recall it being specifically all that hot at the church, but he did have a tie on.  He did not eat breakfast that morning which was an unusual thing for him.  He has not had any recent illnesses no fevers or chills.  Is been eating and drinking otherwise okay.  Despite one syncopal episode he has not had any further episodes nor has he had any TIA or amaurosis fugax symptoms.  No sensation of rapid irregular heartbeats palpitations either during that episode, leading up to  it or since.  No chest pain or pressure with rest or exertion (he never has).  No exertional dyspnea.  No PND, orthopnea or edema.   ROS: A comprehensive was performed. Review of Systems  Constitutional: Negative for fever, malaise/fatigue and weight loss.  HENT: Negative for nosebleeds.   Respiratory: Negative for cough, shortness of breath and wheezing.   Cardiovascular: Negative for leg swelling (Well-controlled).  Gastrointestinal: Positive for heartburn (depending upon what he eats). Negative for abdominal pain and constipation.  Genitourinary: Negative for dysuria.  Musculoskeletal: Positive for joint pain (Left  great toe and ankle). Negative for back pain, falls and myalgias.  Skin: Negative.   Neurological: Positive for loss of consciousness (See HPI). Negative for dizziness (None since or before his episode this weekend.) and focal weakness.  Endo/Heme/Allergies: Negative for environmental allergies. Does not bruise/bleed easily.  Psychiatric/Behavioral: Negative for depression and memory loss. The patient is not nervous/anxious and does not have insomnia.   All other systems reviewed and are negative.   I have reviewed and (if needed) personally updated the patient's problem list, medications, allergies, past medical and surgical history, social and family history.   Past Medical History:  Diagnosis Date  . 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 Ellyn Hack (Cardiologist): For Abnormal ST: CABG X 5; cath 03/2012 --> LIMA-LAD - patent, SVG-Ramus - anastamotic 80% - DES stent; SVG-RCA & SVG-OM1-OM2-- occluded; DES Stent to Cx-OM1  . Cardiomyopathy, ischemic- moderate LVD (EF of roughly 45%) at cath 2/143 11/03/2012  . Carpal tunnel syndrome on right   . Chronic back pain    spinal stenosis and scoliosis  . DDD (degenerative disc disease), lumbosacral   . Diabetes type 2, controlled ([place])    takes Metformin once a wk  . 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 Dyslipidemia    takes Simvastatin daily  . First degree heart block   . GERD (gastroesophageal reflux disease)   . History of colon polyps    benign  . History of shingles    forehead --  dec 2016--  no residual pain  . Hypertension    takes Coreg,HCTZ,and Micardis daily  . Osteoarthritis   . Peripheral arterial disease [facility]) May 2016  (seen by dr berry)   L. ABI 0.58 with high grade L. SFA stenosis and occluded L. DP. R. ABI 0.48 with occluded R. SFA and occluded R. DP.  . Pneumonia    hx of-as a child  . Weakness    numbness and tingling    Past Surgical History:  Procedure Laterality Date  . CARDIAC  CATHETERIZATION  05/15/2010   abnormal stress test-- 3 vessel CAD/  LVEDP=47mmHg  (Dr. Debara Pickett for Dr,. Ellyn Hack)   . CARPAL TUNNEL RELEASE Right 10/10/2015   Procedure: RIGHT HAND CARPAL TUNNEL RELEASE;  Surgeon: Iran Planas, MD;  Location: [place];  Service: Orthopedics;  Laterality: Right;  . COLONOSCOPY    . CORONARY ANGIOPLASTY     3 stents  . CORONARY ARTERY BYPASS GRAFT  05/22/2010  dr Lucianne Lei tright   x 5 (LIMA-LAD, SVG- RI,. SVG-OM1-OM2, SVG-dRCA)  . DECOMPRESSIVE LUMBAR LAMINECTOMY LEVEL 1 N/A 03/08/2016   Procedure: Lumbar 3-5 decompression with in situ fusion;  Surgeon: Melina Schools, MD;  Location: [place];  Service: Orthopedics;  Laterality: N/A;  . LEFT HEART CATHETERIZATION WITH CORONARY ANGIOGRAM N/A 04/02/2012   Procedure: LEFT HEART CATHETERIZATION WITH CORONARY ANGIOGRAM;  Surgeon: Leonie Man, MD;  Location: [facility] CATH  LAB: Occluded SVG-rPDA & native RCA, 100% SVG-OM. Severe anastomotic lesion in SVG-RI.  Patent LIMA-LAD. Severe mid Cx-OM  . NM MYOVIEW LTD  03/25/2012   Dr. Ellyn Hack: High Risk exercise nuclear study w/ scar in the apex and upper mid to basal inferior wall w/ signgicnat additional iscemia inferiorly to lateral-inferiorly from apex to base/  severe LV dysfunction w/ infero-apical dyskinesis and hypocontractility in the inferior and lateral wall, ef 33%  . NM MYOVIEW LTD  04/2016   large-size, moderate intensity reversible perfusion defect in the inferior wall suggestive of ischemia. EF 34%. Global hypokinesis and severe inferior hypokinesis to akinesis. Also noted horizontal ST segment depression in inferior-lateralleads. HIGH RISK  . PERCUTANEOUS CORONARY STENT INTERVENTION (PCI-S) N/A 04/08/2012   Procedure: PERCUTANEOUS CORONARY STENT INTERVENTION (PCI-S);  Surgeon: Leonie Man, MD;  Location: [facility] CATH LAB: Promus Premier DES 2.25 mm x 12 mm - Mid Cx-OM1; Promus Premier DES 2.5 mm x 16 mm -- anastomotic SVG-RI (graft into native)  . TOTAL KNEE  ARTHROPLASTY Right 10/26/2010  . TRANSTHORACIC ECHOCARDIOGRAM  08/21/2010   mild LVH, EF 45-50%, inferior hypokinesis,  grade 1 diastolic dysfunction/  moderate LAE/   mild MR/  trivial TR/  mild dilated RV  . TRANSTHORACIC ECHOCARDIOGRAM  05/2016   EF estimated 55-60% with GRD 2 DD. This was contrary to low we have noted on Myoview.  Marland Kitchen UMBILICAL HERNIA REPAIR  1980's    Current Meds  Medication Sig  . atorvastatin (LIPITOR) 20 MG tablet TAKE 1 TABLET BY MOUTH EVERY DAY  . carvedilol (COREG) 25 MG tablet TAKE 1 TABLET (25 MG TOTAL) BY MOUTH 2 (TWO) TIMES DAILY.  Marland Kitchen clopidogrel (PLAVIX) 75 MG tablet TAKE 1 TABLET BY MOUTH EVERY DAY  . fish oil-omega-3 fatty acids 1000 MG capsule Take 1 g by mouth daily.   . metFORMIN (GLUCOPHAGE) 500 MG tablet Take 500 mg by mouth daily with breakfast.  . nitroGLYCERIN (NITROSTAT) 0.4 MG SL tablet Place 1 tablet (0.4 mg total) under the tongue every 5 (five) minutes as needed for chest pain.  . simvastatin (ZOCOR) 40 MG tablet TAKE 1 TABLET (40 MG TOTAL) BY MOUTH EVERY EVENING.  Marland Kitchen telmisartan (MICARDIS) 80 MG tablet TAKE 2 TABLETS BY MOUTH DAILY  . [DISCONTINUED] amLODipine (NORVASC) 10 MG tablet Take 1 tablet (10 mg total) by mouth daily.  . [DISCONTINUED] Ca Carbonate-Mag Hydroxide (ROLAIDS PO) Take 1 tablet by mouth 3 (three) times daily as needed.   . [DISCONTINUED] furosemide (LASIX) 20 MG tablet Take 1 tablet (20 mg total) by mouth daily as needed.    Allergies  Allergen Reactions  . No Known Allergies    Social History   Tobacco Use  . Smoking status: Former Smoker    Types: Cigarettes    Last attempt to quit: 09/26/1980    Years since quitting: 37.4  . Smokeless tobacco: Never Used  Substance Use Topics  . Alcohol use: No  . Drug use: No   Social History   Social History Narrative   He is a married father of 2. He exercises routinely at the [facility].Marland Kitchen    He quit smoking about 30 years ago and has social alcohol.    Family History family  history includes Cancer in his mother.  Wt Readings from Last 3 Encounters:  03/07/18 223 lb 9.6 oz (101.4 kg)  12/03/17 219 lb 12.8 oz (99.7 kg)  11/27/16 225 lb (102.1 kg)    PHYSICAL EXAM BP (!) 154/74   Pulse 67  Ht 6\' 2"  (1.88 m)   Wt 223 lb 9.6 oz (101.4 kg)   BMI 28.71 kg/m  Physical Exam  Constitutional: He is oriented to person, place, and time. He appears well-developed and well-nourished. No distress.  Healthy-appearing. Well-groomed.  HENT:  Head: Normocephalic and atraumatic.  Mouth/Throat: Oropharynx is clear and moist.  Neck: Normal range of motion. Neck supple. No hepatojugular reflux and no JVD present. Carotid bruit is not present. No thyromegaly present.  Cardiovascular: Normal rate, regular rhythm, normal heart sounds and intact distal pulses.  Occasional extrasystoles are present. PMI is not displaced. Exam reveals no gallop.  No murmur heard. Pulmonary/Chest: Effort normal and breath sounds normal. No respiratory distress. He has no wheezes. He has no rales. He exhibits no tenderness.  Abdominal: Soft. Bowel sounds are normal. He exhibits no distension. There is no abdominal tenderness. There is no rebound and no guarding.  Musculoskeletal: Normal range of motion.        General: No deformity or edema.  Lymphadenopathy:    He has no cervical adenopathy.  Neurological: He is alert and oriented to person, place, and time. No cranial nerve deficit.  Skin: Skin is warm and dry. No erythema.  Psychiatric: He has a normal mood and affect. His behavior is normal. Judgment and thought content normal.  Vitals reviewed.   Adult ECG Report  Rate: 64 ;  Rhythm: normal sinus rhythm and Inferior MI, age undetermined.  Otherwise normal axis, intervals & durations;   Narrative Interpretation: stable EKG   Other studies Reviewed: Additional studies/ records that were reviewed today include:  Recent Labs:  Checked by PCP ~3 months ago. No results found for: CHOL, HDL,  LDLCALC, LDLDIRECT, TRIG, CHOLHDL  ASSESSMENT / PLAN: Problem List Items Addressed This Visit    Atherosclerotic heart disease of artery bypass graft - occluded SVG-RCA and occluded SVG-OM. Status post PCI to SVG-RI (Chronic)    No active angina.  However with no real flow to the RCA system, I am worried that there could be a potential of ischemic arrhythmia. Plan: 30-day event monitor. Checking 2D echocardiogram, if new wall motion and normality noted, would have to consider ischemic evaluation.      Relevant Medications   hydrochlorothiazide (MICROZIDE) 12.5 MG capsule   CAD, CABG X 18 May 2010, progression at cath 04/02/12 -- status post PCI to SVG-RI & native Cx-OM. (Chronic)   Relevant Medications   hydrochlorothiazide (MICROZIDE) 12.5 MG capsule   Other Relevant Orders   ECHOCARDIOGRAM COMPLETE   CARDIAC EVENT MONITOR   VAS US CAROTID   Cardiomyopathy, ischemic-EF improved from 45% post CABG up to 55-60% (Chronic)    EF looked to have improved by most recent echo.  But now with him having a syncopal episode we will recheck to ensure there is no new structural abnormalities.  If there is new wall motion normality, may need to consider relook ischemic evaluation      Relevant Medications   hydrochlorothiazide (MICROZIDE) 12.5 MG capsule   Essential hypertension (Chronic)    He is always had a little bit elevated blood pressures, but with his recent syncope, I would like to allow for mild permissive hypertension.  Please see recommendations for syncope.  Reducing HCTZ 12.5 mg and changing telmisartan to 40 mg twice daily with the additional 40 mg the afternoon being for increased systolic pressures greater than 140 mmHg.      Relevant Medications   hydrochlorothiazide (MICROZIDE) 12.5 MG capsule   Peripheral arterial disease (  [place]) (Chronic)    No active claudication.  But with him having a syncopal episode, will check carotid Dopplers to ensure no carotid or vertebral artery  disease.      Relevant Medications   hydrochlorothiazide (MICROZIDE) 12.5 MG capsule   Other Relevant Orders   ECHOCARDIOGRAM COMPLETE   CARDIAC EVENT MONITOR   VAS US CAROTID   Syncope - Primary    I need to get the information from [facility] to know what all was done and and what the initial suspicion was. It sounds probably neurocardiogenic or potential vasovagal.  It seems like he got hot, partly because of his tie and then passed out because of drop in blood pressure.  He may have been somewhat dehydrated.  Hard to tell.  He did note sensation of palpitations.  Plan:   We will order the standard tests including echocardiogram, event monitor and carotid Dopplers.  Because am concerned about possible dehydration and hypotension, I would have him decrease his HCTZ dose down to 12.5 mg daily   convert his antihypertensive regimen from telmisartan 80 mg in the morning and 40 mg in the afternoon --> take 40 mg in the morning along with the HCTZ and only take 40 mg in the afternoon unless his blood pressure tend to increase.    If blood pressures stay at the current level, would increase afternoon dose to 80 mg.      Relevant Medications   hydrochlorothiazide (MICROZIDE) 12.5 MG capsule   Other Relevant Orders   ECHOCARDIOGRAM COMPLETE   CARDIAC EVENT MONITOR   VAS US CAROTID   Type II diabetes mellitus with complication-- CAD (Chronic)    There is no suggestion that his syncopal episode had to do with hypoglycemia, however need to confirm.         Current medicines are reviewed at length with the patient today. (+/- concerns) ?  Left foot pain The following changes have been made: .  Titrate Norvasc  Patient Instructions  Medication Instructions:   DECREASE  HCTZ TO 12.5 MG  DAILY  FOR THE NEXT 2 WEEKS TAKE TELMISARTAN 40 MG( 1/2 TABLET) TWICE A DAY IF BLOOD PRESURE REMAIN RANGE CONTINUE  WITH THIS DOSE. BUT IF BLOOD PRESSURE INCREASE  TAKE 40 MG IN THE MORNING  AND 80 MG IN THE EVENING  If you need a refill on your cardiac medications before your next appointment, please call your pharmacy.   Lab work: NOT NEEDED If you have labs (blood work) drawn today and your tests are completely normal, you will receive your results only by: Marland Kitchen MyChart Message (if you have MyChart) OR . A paper copy in the mail If you have any lab test that is abnormal or we need to change your treatment, we will call you to review the results.  Testing/Procedures: SCHEDULE AT [place] has recommended that you wear an event monitor-30 DAY. Event monitors are medical devices that record the heart's electrical activity. Doctors most often [place] these monitors to diagnose arrhythmias. Arrhythmias are problems with the speed or rhythm of the heartbeat. The monitor is a small, portable device. You can wear one while you do your normal daily activities. This is usually used to diagnose what is causing palpitations/syncope (passing out).  AND Your physician has requested that you have an echocardiogram. Echocardiography is a painless test that uses sound waves to create images of your heart. It provides your doctor with information about the size  and shape of your heart and how well your heart's chambers and valves are working. This procedure takes approximately one hour. There are no restrictions for this procedure.  SCHEDULE [place] has requested that you have a carotid duplex. This test is an ultrasound of the carotid arteries in your neck. It looks at blood flow through these arteries that supply the brain with blood. Allow one hour for this exam. There are no restrictions or special instructions.    Follow-Up: At [facility], you and your health needs are our priority.  As part of our continuing mission to provide you with exceptional heart care, we have created designated Provider Care Teams.   These Care Teams include your primary Cardiologist (physician) and Advanced Practice Providers (APPs -  Physician Assistants and Nurse Practitioners) who all work together to provide you with the care you need, when you need it. . Your physician recommends that you schedule a follow-up appointment in 2 [place] .   Any Other Special Instructions Will Be Listed Below (If Applicable).      Studies Ordered:   Orders Placed This Encounter  Procedures  . CARDIAC EVENT MONITOR  . ECHOCARDIOGRAM COMPLETE      Glenetta Hew, M.D., M.S. Interventional Cardiologist   Pager # [phone] Phone # [phone] [address] [place], [place] [address]

## 2018-03-07 NOTE — Patient Instructions (Signed)
Medication Instructions:   DECREASE  HCTZ TO 12.5 MG  DAILY  FOR THE NEXT 2 WEEKS TAKE TELMISARTAN 40 MG( 1/2 TABLET) TWICE A DAY IF BLOOD PRESURE REMAIN RANGE CONTINUE  WITH THIS DOSE. BUT IF BLOOD PRESSURE INCREASE  TAKE 40 MG IN THE MORNING AND 80 MG IN THE EVENING  If you need a refill on your cardiac medications before your next appointment, please call your pharmacy.   Lab work: NOT NEEDED If you have labs (blood work) drawn today and your tests are completely normal, you will receive your results only by: Marland Kitchen MyChart Message (if you have MyChart) OR . A paper copy in the mail If you have any lab test that is abnormal or we need to change your treatment, we will call you to review the results.  Testing/Procedures: SCHEDULE AT [place] has recommended that you wear an event monitor-30 DAY. Event monitors are medical devices that record the heart's electrical activity. Doctors most often [place] these monitors to diagnose arrhythmias. Arrhythmias are problems with the speed or rhythm of the heartbeat. The monitor is a small, portable device. You can wear one while you do your normal daily activities. This is usually used to diagnose what is causing palpitations/syncope (passing out).  AND Your physician has requested that you have an echocardiogram. Echocardiography is a painless test that uses sound waves to create images of your heart. It provides your doctor with information about the size and shape of your heart and how well your heart's chambers and valves are working. This procedure takes approximately one hour. There are no restrictions for this procedure.  SCHEDULE [place] has requested that you have a carotid duplex. This test is an ultrasound of the carotid arteries in your neck. It looks at blood flow through these arteries that supply the brain with blood. Allow one hour for this exam. There are no  restrictions or special instructions.    Follow-Up: At [facility], you and your health needs are our priority.  As part of our continuing mission to provide you with exceptional heart care, we have created designated Provider Care Teams.  These Care Teams include your primary Cardiologist (physician) and Advanced Practice Providers (APPs -  Physician Assistants and Nurse Practitioners) who all work together to provide you with the care you need, when you need it. . Your physician recommends that you schedule a follow-up appointment in 2 [place] .   Any Other Special Instructions Will Be Listed Below (If Applicable).

## 2018-03-09 ENCOUNTER — Encounter: Payer: Self-pay | Admitting: Cardiology

## 2018-03-09 NOTE — Assessment & Plan Note (Signed)
There is no suggestion that his syncopal episode had to do with hypoglycemia, however need to confirm.

## 2018-03-09 NOTE — Assessment & Plan Note (Signed)
I need to get the information from [facility] to know what all was done and and what the initial suspicion was. It sounds probably neurocardiogenic or potential vasovagal.  It seems like he got hot, partly because of his tie and then passed out because of drop in blood pressure.  He may have been somewhat dehydrated.  Hard to tell.  He did note sensation of palpitations.  Plan:   We will order the standard tests including echocardiogram, event monitor and carotid Dopplers.  Because am concerned about possible dehydration and hypotension, I would have him decrease his HCTZ dose down to 12.5 mg daily   convert his antihypertensive regimen from telmisartan 80 mg in the morning and 40 mg in the afternoon --> take 40 mg in the morning along with the HCTZ and only take 40 mg in the afternoon unless his blood pressure tend to increase.    If blood pressures stay at the current level, would increase afternoon dose to 80 mg.

## 2018-03-09 NOTE — Assessment & Plan Note (Signed)
No active claudication.  But with him having a syncopal episode, will check carotid Dopplers to ensure no carotid or vertebral artery disease.

## 2018-03-09 NOTE — Assessment & Plan Note (Signed)
No active angina.  However with no real flow to the RCA system, I am worried that there could be a potential of ischemic arrhythmia. Plan: 30-day event monitor. Checking 2D echocardiogram, if new wall motion and normality noted, would have to consider ischemic evaluation.

## 2018-03-09 NOTE — Assessment & Plan Note (Signed)
EF looked to have improved by most recent echo.  But now with him having a syncopal episode we will recheck to ensure there is no new structural abnormalities.  If there is new wall motion normality, may need to consider relook ischemic evaluation

## 2018-03-09 NOTE — Assessment & Plan Note (Addendum)
He is always had a little bit elevated blood pressures, but with his recent syncope, I would like to allow for mild permissive hypertension.  Please see recommendations for syncope.  Reducing HCTZ 12.5 mg and changing telmisartan to 40 mg twice daily with the additional 40 mg the afternoon being for increased systolic pressures greater than 140 mmHg.

## 2018-03-15 DIAGNOSIS — I6529 Occlusion and stenosis of unspecified carotid artery: Secondary | ICD-10-CM | POA: Insufficient documentation

## 2018-03-15 HISTORY — PX: OTHER SURGICAL HISTORY: SHX169

## 2018-03-15 HISTORY — DX: Occlusion and stenosis of unspecified carotid artery: I65.29

## 2018-03-15 HISTORY — PX: TRANSTHORACIC ECHOCARDIOGRAM: SHX275

## 2018-03-20 ENCOUNTER — Ambulatory Visit (INDEPENDENT_AMBULATORY_CARE_PROVIDER_SITE_OTHER): Payer: Medicare Other

## 2018-03-20 ENCOUNTER — Ambulatory Visit (HOSPITAL_COMMUNITY)
Admission: RE | Admit: 2018-03-20 | Discharge: 2018-03-20 | Disposition: A | Discharge status: Home / Self Care | Payer: Medicare Other | Source: Ambulatory Visit | Attending: Internal Medicine | Admitting: Internal Medicine

## 2018-03-20 ENCOUNTER — Ambulatory Visit (HOSPITAL_BASED_OUTPATIENT_CLINIC_OR_DEPARTMENT_OTHER): Payer: Medicare Other

## 2018-03-20 DIAGNOSIS — I2510 Atherosclerotic heart disease of native coronary artery without angina pectoris: Secondary | ICD-10-CM | POA: Diagnosis not present

## 2018-03-20 DIAGNOSIS — I739 Peripheral vascular disease, unspecified: Secondary | ICD-10-CM

## 2018-03-20 DIAGNOSIS — R55 Syncope and collapse: Secondary | ICD-10-CM | POA: Insufficient documentation

## 2018-03-20 MED ORDER — PERFLUTREN LIPID MICROSPHERE
1.0000 mL | INTRAVENOUS | Status: AC | PRN
  Administered 2018-03-20: 2 mL via INTRAVENOUS

## 2018-03-21 ENCOUNTER — Telehealth: Payer: Self-pay | Admitting: *Deleted

## 2018-03-21 DIAGNOSIS — I6523 Occlusion and stenosis of bilateral carotid arteries: Secondary | ICD-10-CM

## 2018-03-21 NOTE — Telephone Encounter (Signed)
-----   Message from Leonie Man, MD sent at 03/20/2018  8:38 PM EST ----- Carotid Dopplers: Mild right carotid <39% stenosis.  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  Bilateral vertebral arteries and subclavian arteries are normal.  Nothing to explain syncope.  With moderate left carotid stenosis, would recommend follow-up in 1 year.  Glenetta Hew, MD  pls fwd to PCP: Neale Burly, MD

## 2018-03-21 NOTE — Telephone Encounter (Signed)
Advised wife, ok per DPR. Order and recall for repeat study in [place]. Sent to PCP

## 2018-03-21 NOTE — Telephone Encounter (Signed)
-----   Message from Leonie Man, MD sent at 03/20/2018  8:37 PM EST ----- Echo results: EF 55 to 60%.  This is even better than before.  Grade 1 diastolic dysfunction. Moderate left atrial dilation.  Aortic valve calcification with evidence of sclerosis but no stenosis.  Essentially normal echo for age.  Glenetta Hew, MD  pls fwd to PCP: Neale Burly, MD

## 2018-04-03 ENCOUNTER — Other Ambulatory Visit: Payer: Self-pay | Admitting: Cardiology

## 2018-05-08 ENCOUNTER — Ambulatory Visit: Payer: Medicare Other | Admitting: Cardiology

## 2018-06-03 ENCOUNTER — Telehealth: Payer: Self-pay | Admitting: *Deleted

## 2018-06-03 NOTE — Telephone Encounter (Signed)
   TELEPHONE CALL NOTE  This patient has been deemed a candidate for follow-up tele-health visit to limit community exposure during the Covid-19 pandemic. I spoke with the patient via phone to discuss instructions.  The patient will receive a phone call 2-3 days prior to their E-Visit at which time consent will be verbally confirmed.   A Virtual Office Visit appointment type has been scheduled for 2 PM , 06/05/18 with [place] patient prefers  [place]/telephone type.   Raiford Simmonds, RN 06/03/2018 2:45 PM

## 2018-06-06 ENCOUNTER — Telehealth (INDEPENDENT_AMBULATORY_CARE_PROVIDER_SITE_OTHER): Payer: Medicare Other | Admitting: Cardiology

## 2018-06-06 ENCOUNTER — Encounter: Payer: Self-pay | Admitting: Cardiology

## 2018-06-06 VITALS — BP 138/66 | HR 66 | Ht 74.0 in | Wt 211.0 lb

## 2018-06-06 DIAGNOSIS — I43 Cardiomyopathy in diseases classified elsewhere: Secondary | ICD-10-CM

## 2018-06-06 DIAGNOSIS — I25708 Atherosclerosis of coronary artery bypass graft(s), unspecified, with other forms of angina pectoris: Secondary | ICD-10-CM | POA: Diagnosis not present

## 2018-06-06 DIAGNOSIS — E1169 Type 2 diabetes mellitus with other specified complication: Secondary | ICD-10-CM

## 2018-06-06 DIAGNOSIS — Z955 Presence of coronary angioplasty implant and graft: Secondary | ICD-10-CM

## 2018-06-06 DIAGNOSIS — I10 Essential (primary) hypertension: Secondary | ICD-10-CM

## 2018-06-06 DIAGNOSIS — I255 Ischemic cardiomyopathy: Secondary | ICD-10-CM

## 2018-06-06 DIAGNOSIS — I6529 Occlusion and stenosis of unspecified carotid artery: Secondary | ICD-10-CM | POA: Diagnosis not present

## 2018-06-06 DIAGNOSIS — I6522 Occlusion and stenosis of left carotid artery: Secondary | ICD-10-CM

## 2018-06-06 DIAGNOSIS — Z951 Presence of aortocoronary bypass graft: Secondary | ICD-10-CM

## 2018-06-06 DIAGNOSIS — R55 Syncope and collapse: Secondary | ICD-10-CM

## 2018-06-06 DIAGNOSIS — E785 Hyperlipidemia, unspecified: Secondary | ICD-10-CM

## 2018-06-06 DIAGNOSIS — I119 Hypertensive heart disease without heart failure: Secondary | ICD-10-CM | POA: Diagnosis not present

## 2018-06-06 NOTE — Assessment & Plan Note (Signed)
Has moderate left carotid artery disease.  Will need follow-up carotid Dopplers next year.

## 2018-06-06 NOTE — Progress Notes (Signed)
Virtual Visit via Video Note   This visit type was conducted due to national recommendations for restrictions regarding the COVID-19 Pandemic (e.g. social distancing) in an effort to limit this patient's exposure and mitigate transmission in our community.  Due to his co-morbid illnesses, this patient is at least at moderate risk for complications without adequate follow up.  This format is felt to be most appropriate for this patient at this time.  All issues noted in this document were discussed and addressed.  A limited physical exam was performed with this format.  Please refer to the patient's chart for his consent to telehealth for [facility].   Patient has given verbal permission to conduct this visit via virtual appointment and to bill insurance 06/06/2018 4:12 PM     Evaluation Performed:  Follow-up visit  Date:  06/06/2018   ID:  CHRISTIANJAMES SOULE, DOB 05/01/1940, MRN [id number]  Patient Location: Home Provider Location: Home  PCP:  Neale Burly, MD  Cardiologist:  Glenetta Hew, MD  Electrophysiologist:  None   Chief Complaint:  3 month f/u - test results  History of Present Illness:    ORYON GARY is a 78 y.o. male with PMH notable for CAD-CABG (essentially asymptomatic multivessel CAD with an abnormal Myoview done for preop evaluation leading to CABG, then followed up again by abnormal Myoview leading to PCI).  Tandre presents via Engineer, civil (consulting) for a telehealth visit today as a 86-month follow-up to review the results of studies.Marland Kitchen --Most recent Myoview in March 2018 was read as high risk because of reduced EF.  Recheck an echocardiogram which showed an EF of 55 to 60%.  No ischemia on Myoview.  [name] was last seen in January after an ER visit for syncopal episode.  Had them back off on his HCTZ dose 12.5 mg daily.  We also backed up on his Micardis to 1 tablet a day with the option of taking additional half tablet for elevated blood pressures.  --Was evaluated with an event monitor, echocardiogram, and carotid Dopplers all reviewed below.  Interval History:  Marin is doing "GREAT".  Only issue is that he is not able to do his [employer] workouts. -- trying to keep up using Treadmill @ home.   Cardiovascular ROS: no chest pain or dyspnea on exertion negative for - edema, loss of consciousness, orthopnea, palpitations, paroxysmal nocturnal dyspnea, rapid heart rate, shortness of breath or syncope / near syncope; TIA / amaurosis fugax  The patient does have symptoms concerning for COVID-19 infection (fever, chills, cough, or new shortness of breath).  The patient is practicing social distancing.  ROS:  Please see the history of present illness.    Review of Systems  Constitutional: Negative for malaise/fatigue and weight loss.  HENT: Negative for congestion, nosebleeds and sinus pain.   Respiratory: Negative for cough, shortness of breath and wheezing.        Sneezing 2/2 pollen off & on  Cardiovascular: Negative for claudication.  Gastrointestinal: Negative for blood in stool, constipation, melena, nausea and vomiting.  Genitourinary: Negative for hematuria.  Musculoskeletal: Positive for back pain (still some pain - but better).  All other systems reviewed and are negative.   Past Medical History:  Diagnosis Date  . 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 Ellyn Hack (Cardiologist): For Abnormal ST: CABG X 5; cath 03/2012 --> LIMA-LAD - patent, SVG-Ramus - anastamotic 80% - DES stent; SVG-RCA & SVG-OM1-OM2-- occluded; DES Stent to Cx-OM1  . Cardiomyopathy,  ischemic- moderate LVD (EF of roughly 45%) at cath 2/143 11/03/2012  . Carotid artery narrowings 03/2018   Moderate L ICA 40-59%  . Carpal tunnel syndrome on right   . Chronic back pain    spinal stenosis and scoliosis  . DDD (degenerative disc disease), lumbosacral   . Diabetes type 2, controlled ([place])    takes Metformin once a wk  . DJD (degenerative joint disease) 9/12    Rt TKR  . Dyslipidemia    takes Simvastatin daily  . First degree heart block   . GERD (gastroesophageal reflux disease)   . History of colon polyps    benign  . History of shingles    forehead --  dec 2016--  no residual pain  . Hypertension    takes Coreg,HCTZ,and Micardis daily  . Osteoarthritis   . Peripheral arterial disease [facility]) May 2016  (seen by dr berry)   L. ABI 0.58 with high grade L. SFA stenosis and occluded L. DP. R. ABI 0.48 with occluded R. SFA and occluded R. DP.  . Pneumonia    hx of-as a child  . Weakness    numbness and tingling   Past Surgical History:  Procedure Laterality Date  . CARDIAC CATHETERIZATION  05/15/2010   abnormal stress test-- 3 vessel CAD/  LVEDP=22mmHg  (Dr. Debara Pickett for Dr,. Ellyn Hack)   . Carotid Dopplers  03/2018    Normal LV size and function.  EF 55 to 60%.  No R WMA.  Impaired relaxation (GR 1 DD).  Moderate LA dilation.  Moderate aortic thickening and calcification-sclerosis with no stenosis.  . CARPAL TUNNEL RELEASE Right 10/10/2015   Procedure: RIGHT HAND CARPAL TUNNEL RELEASE;  Surgeon: Iran Planas, MD;  Location: [place];  Service: Orthopedics;  Laterality: Right;  . COLONOSCOPY    . CORONARY ANGIOPLASTY     3 stents  . CORONARY ARTERY BYPASS GRAFT  05/22/2010  dr Lucianne Lei tright   x 5 (LIMA-LAD, SVG- RI,. SVG-OM1-OM2, SVG-dRCA)  . DECOMPRESSIVE LUMBAR LAMINECTOMY LEVEL 1 N/A 03/08/2016   Procedure: Lumbar 3-5 decompression with in situ fusion;  Surgeon: Melina Schools, MD;  Location: [place];  Service: Orthopedics;  Laterality: N/A;  . LEFT HEART CATHETERIZATION WITH CORONARY ANGIOGRAM N/A 04/02/2012   Procedure: LEFT HEART CATHETERIZATION WITH CORONARY ANGIOGRAM;  Surgeon: Leonie Man, MD;  Location: [facility] CATH LAB: Occluded SVG-rPDA & native RCA, 100% SVG-OM. Severe anastomotic lesion in SVG-RI.  Patent LIMA-LAD. Severe mid Cx-OM  . NM MYOVIEW LTD  03/25/2012   Dr. Ellyn Hack: High Risk exercise nuclear study w/ scar in the  apex and upper mid to basal inferior wall w/ signgicnat additional iscemia inferiorly to lateral-inferiorly from apex to base/  severe LV dysfunction w/ infero-apical dyskinesis and hypocontractility in the inferior and lateral wall, ef 33%  . NM MYOVIEW LTD  04/2016   large-size, moderate intensity reversible perfusion defect in the inferior wall suggestive of ischemia. EF 34%. Global hypokinesis and severe inferior hypokinesis to akinesis. Also noted horizontal ST segment depression in inferior-lateralleads. HIGH RISK  . PERCUTANEOUS CORONARY STENT INTERVENTION (PCI-S) N/A 04/08/2012   Procedure: PERCUTANEOUS CORONARY STENT INTERVENTION (PCI-S);  Surgeon: Leonie Man, MD;  Location: [facility] CATH LAB: Promus Premier DES 2.25 mm x 12 mm - Mid Cx-OM1; Promus Premier DES 2.5 mm x 16 mm -- anastomotic SVG-RI (graft into native)  . TOTAL KNEE ARTHROPLASTY Right 10/26/2010  . TRANSTHORACIC ECHOCARDIOGRAM  08/21/2010   mild LVH, EF 45-50%, inferior hypokinesis,  grade 1  diastolic dysfunction/  moderate LAE/   mild MR/  trivial TR/  mild dilated RV  . TRANSTHORACIC ECHOCARDIOGRAM  03/2018    Normal LV size and function.  EF 55 to 60%.  No R WMA.  Impaired relaxation (GR 1 DD).  Moderate LA dilation.  Moderate aortic thickening and calcification-sclerosis with no stenosis.  Marland Kitchen UMBILICAL HERNIA REPAIR  1980's     Current Meds  Medication Sig  . atorvastatin (LIPITOR) 20 MG tablet TAKE 1 TABLET BY MOUTH EVERY DAY  . carvedilol (COREG) 25 MG tablet TAKE 1 TABLET (25 MG TOTAL) BY MOUTH 2 (TWO) TIMES DAILY.  Marland Kitchen clopidogrel (PLAVIX) 75 MG tablet TAKE 1 TABLET BY MOUTH EVERY DAY  . fish oil-omega-3 fatty acids 1000 MG capsule Take 1 g by mouth daily.   . hydrochlorothiazide (MICROZIDE) 12.5 MG capsule Take 1 capsule (12.5 mg total) by mouth daily.  . metFORMIN (GLUCOPHAGE) 500 MG tablet Take 500 mg by mouth daily with breakfast.  . nitroGLYCERIN (NITROSTAT) 0.4 MG SL tablet Place 1 tablet (0.4 mg total) under the  tongue every 5 (five) minutes as needed for chest pain.  Marland Kitchen telmisartan (MICARDIS) 80 MG tablet TAKE 2 TABLETS BY MOUTH DAILY    -- Micardis - taking 1 tab once daily (occasionally takes additional 1/2 tab)   Allergies:   No known allergies   Social History   Tobacco Use  . Smoking status: Former Smoker    Types: Cigarettes    Last attempt to quit: 09/26/1980    Years since quitting: 37.7  . Smokeless tobacco: Never Used  Substance Use Topics  . Alcohol use: No  . Drug use: No     Family Hx: The patient's family history includes Cancer in his mother.   Prior CV studies:   The following studies were reviewed today: . Event Monitor February-March 2020: Predominantly sinus rhythm with sinus bradycardia and tachycardia (minimum heart rate 52 bpm, max 124 bpm.  Average 69 bpm).  Occasional PVCs but no pauses or arrhythmias.  No bradycardia. -->  Essentially normal. . Echocardiogram 03/20/2018: Normal LV size and function.  EF 55 to 60%.  No R WMA.  Impaired relaxation (GR 1 DD).  Moderate LA dilation.  Moderate aortic thickening and calcification-sclerosis with no stenosis: Moderate left internal carotid 40-59%.  Mild right carotid less than 39%.  Bilateral vertebral and subclavian arteries normal. ->  Recommend one-year follow-up.. . Carotid Dopplers March 20, 2018:: Moderate left internal carotid 40-59%.  Mild right carotid less than 39%.  Bilateral vertebral and subclavian arteries normal. ->  Recommend one-year follow-up.  Labs/Other Tests and Data Reviewed:    EKG:  No ECG reviewed.  Recent Labs: No results found for requested labs within last 8760 hours.   Recent Lipid Panel No results found for: CHOL, TRIG, HDL, CHOLHDL, LDLCALC, LDLDIRECT -- just checked in March by PCP (not available today)  Wt Readings from Last 3 Encounters:  06/06/18 211 lb (95.7 kg)  03/07/18 223 lb 9.6 oz (101.4 kg)  12/03/17 219 lb 12.8 oz (99.7 kg)     Objective:    Vital Signs:  BP 138/66    Pulse 66   Ht 6\' 2"  (1.88 m)   Wt 211 lb (95.7 kg)   BMI 27.09 kg/m   VITAL SIGNS:  reviewed Well nourished, well developed male in no acute distress. A&O x 3.  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 Status post PCI to SVG-RI - Primary (Chronic)    He remains asymptomatic.  Still active and exercising.  Has no real flow to the RCA system, but despite there is no significant wall wall motion abnormality on echocardiogram. Plan: Continue carvedilol,and myocarditis current dosing (1 tablet daily with additional half tablet as needed high blood pressure) Has 2 statins listed.  Need to confirm which one he is on. With significant understands, he remains on clopidogrel.      Cardiomyopathy, ischemic-EF improved from 45% post CABG up to 55-60% (Chronic)     No longer on diuretic as he is euvolemic.  On ARB and beta-blocker stable doses.  EF now confirmed to be normal with 2 sequential echocardiograms.      Carotid artery narrowings (Chronic)    Has moderate left carotid artery disease.  Will need follow-up carotid Dopplers next year.      Dyslipidemia associated with type 2 diabetes mellitus (HCC) (Chronic)    Had lipids checked by PCP.  Would like to get the copy of the labs.   Has simvastatin listed as medication, but is only taking atorvastatin.      Essential hypertension (Chronic)    Blood pressure seems to be stable now back on the once daily dose of Micardis.  Will use PRN additional to half tab if necessary for blood pressure greater than 150 mmHg.      Presence of drug coated stent in left circumflex coronary artery; Promus Premier 2.25 mm x 12 mm (Chronic)    Remains on Plavix long-term.  Would be okay to hold Plavix for procedures but having for 5-7 days.      Syncope    Syncope was most likely  vasovagal-neurocardiogenic, not cardiac in nature.  Work-up is been totally normal.        Overall progress is doing very well.  No further angina symptoms.  No heart failure symptoms.  No further syncope.  COVID-19 Education: The signs and symptoms of COVID-19 were discussed with the patient and how to seek care for testing (follow up with PCP or arrange E-visit).   The importance of social distancing was discussed today.  Time:   Today, I have spent 15 minutes with the patient with telehealth technology discussing the above problems.     Medication Adjustments/Labs and Tests Ordered: Current medicines are reviewed at length with the patient today.  Concerns regarding medicines are outlined above.  Medication Instructions:   Continue with the current way you are taking Micardis.  Tests Ordered: No orders of the defined types were placed in this encounter. None  Medication Changes: No orders of the defined types were placed in this encounter. None  Disposition:  Follow up In November-December    Signed, Ladarian Bonczek, MD  06/06/2018 [facility]

## 2018-06-06 NOTE — Patient Instructions (Addendum)
Medication Instructions:   Continue with the current You are taking Micardis.  If you need a refill on your cardiac medications before your next appointment, please call your pharmacy.   Lab work: None.   Please ask PCP to send copy of recent labs  Testing/Procedures: None   Follow-Up: At [facility], you and your health needs are our priority.  As part of our continuing mission to provide you with exceptional heart care, we have created designated Provider Care Teams.  These Care Teams include your primary Cardiologist (physician) and Advanced Practice Providers (APPs -  Physician Assistants and Nurse Practitioners) who all work together to provide you with the care you need, when you need it. You will need a follow up appointment in November-December     2020         .  Please call our office 2 months in advance to schedule this appointment.  You may see Glenetta Hew, MD or one of the following Advanced Practice Providers on your designated Care Team:   Rosaria Ferries, PA-C . Jory Sims, DNP, ANP  Any Other Special Instructions Will Be Listed Below (If Applicable).

## 2018-06-06 NOTE — Assessment & Plan Note (Signed)
Syncope was most likely vasovagal-neurocardiogenic, not cardiac in nature.  Work-up is been totally normal.

## 2018-06-06 NOTE — Assessment & Plan Note (Addendum)
Had lipids checked by PCP.  Would like to get the copy of the labs.   Has simvastatin listed as medication, but is only taking atorvastatin.

## 2018-06-06 NOTE — Assessment & Plan Note (Signed)
Blood pressure seems to be stable now back on the once daily dose of Micardis.  Will use PRN additional to half tab if necessary for blood pressure greater than 150 mmHg.

## 2018-06-06 NOTE — Assessment & Plan Note (Signed)
Remains on Plavix long-term.  Would be okay to hold Plavix for procedures but having for 5-7 days.

## 2018-06-06 NOTE — Assessment & Plan Note (Signed)
No longer on diuretic as he is euvolemic.  On ARB and beta-blocker stable doses.  EF now confirmed to be normal with 2 sequential echocardiograms.

## 2018-06-06 NOTE — Assessment & Plan Note (Signed)
He remains asymptomatic.  Still active and exercising.  Has no real flow to the RCA system, but despite there is no significant wall wall motion abnormality on echocardiogram. Plan: Continue carvedilol,and myocarditis current dosing (1 tablet daily with additional half tablet as needed high blood pressure) Has 2 statins listed.  Need to confirm which one he is on. With significant understands, he remains on clopidogrel.

## 2018-08-15 IMAGING — NM NM MISC PROCEDURE
9 series · 54 of 54 positions shown · non-contrast
Comparison: none

[Series 1: rest sax · 6.4mm · 6.40mm/px · 6 of 26 frames shown]
[frame 3/26]
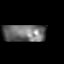
[frame 7/26]
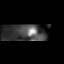
[frame 11/26]
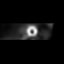
[frame 16/26]
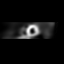
[frame 20/26]
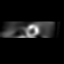
[frame 24/26]
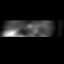

[Series 1: wbr rest · 6.40mm/px · 6 of 64 frames shown]
[frame 6/64]
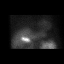
[frame 16/64]
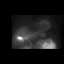
[frame 27/64]
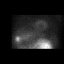
[frame 38/64]
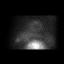
[frame 48/64]
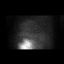
[frame 59/64]
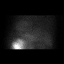

[Series 1: wbr_r-proj_st wbr rest · 6.40mm/px · 6 of 64 frames shown]
[frame 6/64]
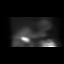
[frame 16/64]
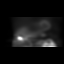
[frame 27/64]
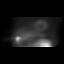
[frame 38/64]
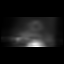
[frame 48/64]
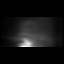
[frame 59/64]
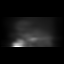

[Series 2: stress sax · 6.4mm · 6.40mm/px · 6 of 25 frames shown]
[frame 3/25]
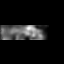
[frame 7/25]
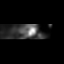
[frame 11/25]
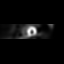
[frame 15/25]
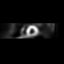
[frame 19/25]
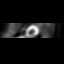
[frame 23/25]
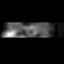

[Series 2: wbr stress-gsp · 6.40mm/px · 6 of 486 frames shown]
[frame 41/486]
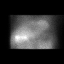
[frame 122/486]
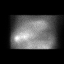
[frame 203/486]
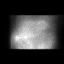
[frame 284/486]
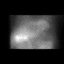
[frame 365/486]
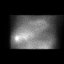
[frame 446/486]
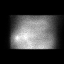

[Series 2: wbr_s-proj_st wbr stress-gsp · 6.40mm/px · 6 of 512 frames shown]
[frame 43/512]
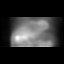
[frame 128/512]
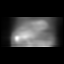
[frame 214/512]
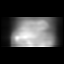
[frame 299/512]
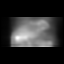
[frame 384/512]
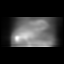
[frame 470/512]
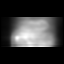

[Series 2: stress sax gs · 6.4mm · 6.40mm/px · 6 of 200 frames shown]
[frame 17/200]
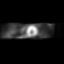
[frame 50/200]
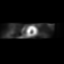
[frame 84/200]
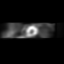
[frame 117/200]
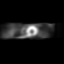
[frame 150/200]
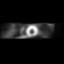
[frame 184/200]
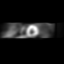

[Series 3: wbr_s-proj_st wbr stress-sum-em · 6.40mm/px · 6 of 64 frames shown]
[frame 6/64]
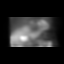
[frame 16/64]
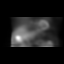
[frame 27/64]
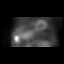
[frame 38/64]
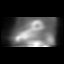
[frame 48/64]
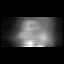
[frame 59/64]
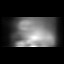

[Series 3: wbr stress-sum-em · 6.40mm/px · 6 of 64 frames shown]
[frame 6/64]
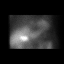
[frame 16/64]
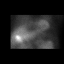
[frame 27/64]
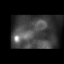
[frame 38/64]
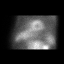
[frame 48/64]
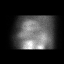
[frame 59/64]
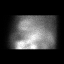

[54 of 54 positions shown; findings below may reference images not displayed]

Canned report from images found in remote index.

Refer to host system for actual result text.

## 2018-12-20 ENCOUNTER — Other Ambulatory Visit: Payer: Self-pay | Admitting: Cardiology

## 2018-12-22 ENCOUNTER — Other Ambulatory Visit: Payer: Self-pay

## 2018-12-22 ENCOUNTER — Ambulatory Visit (INDEPENDENT_AMBULATORY_CARE_PROVIDER_SITE_OTHER): Payer: Medicare Other | Admitting: Cardiology

## 2018-12-22 ENCOUNTER — Encounter: Payer: Self-pay | Admitting: Cardiology

## 2018-12-22 VITALS — BP 148/71 | HR 68 | Ht 74.0 in | Wt 214.0 lb

## 2018-12-22 DIAGNOSIS — I255 Ischemic cardiomyopathy: Secondary | ICD-10-CM

## 2018-12-22 DIAGNOSIS — I2510 Atherosclerotic heart disease of native coronary artery without angina pectoris: Secondary | ICD-10-CM | POA: Diagnosis not present

## 2018-12-22 DIAGNOSIS — I739 Peripheral vascular disease, unspecified: Secondary | ICD-10-CM

## 2018-12-22 DIAGNOSIS — I10 Essential (primary) hypertension: Secondary | ICD-10-CM | POA: Diagnosis not present

## 2018-12-22 DIAGNOSIS — Z9861 Coronary angioplasty status: Secondary | ICD-10-CM | POA: Diagnosis not present

## 2018-12-22 DIAGNOSIS — E785 Hyperlipidemia, unspecified: Secondary | ICD-10-CM

## 2018-12-22 DIAGNOSIS — I6529 Occlusion and stenosis of unspecified carotid artery: Secondary | ICD-10-CM

## 2018-12-22 DIAGNOSIS — E1169 Type 2 diabetes mellitus with other specified complication: Secondary | ICD-10-CM

## 2018-12-22 DIAGNOSIS — R9439 Abnormal result of other cardiovascular function study: Secondary | ICD-10-CM

## 2018-12-22 MED ORDER — HYDROCHLOROTHIAZIDE 25 MG PO TABS: 25.0000 mg | ORAL_TABLET | Freq: Every day | ORAL | 3 refills | Status: DC

## 2018-12-22 NOTE — Progress Notes (Signed)
Primary Care Provider: Neale Burly, MD Cardiologist: Billy Hew, MD Electrophysiologist:   Clinic Note: Chief Complaint  Patient presents with  . Follow-up    As usual, no complaints  . Coronary Artery Disease    Always asymptomatic   HPI:    [name] is a 78 y.o. male with a Cardiac history noted below who presents today for 6 month f/u   Initial cardiac evaluation was for preoperative risk stratification back in April 2012.-> Nuclear stress test was grossly abnormal and cardiac cath showed multivessel CAD he was referred for CABG.  2 years later - Abnormal stress test: Return the Cath Lab where he was found to have occluded SVG RCA as well as SVG-OM1-OM 2 --> PCI of native circumflex-OM1 and SVG-RI  Myoview 04/2016 - read as HIGH RISK b/c reduced LVEF -- Echo confirmed relatively Normal EF. -- checked Echo.  No ischemia - Med Rx.   2-D echo 03/21/2018: EF ~55-60% with GRD 1 DD.  Moderate LA dilation.  Moderate aortic thickening/sclerosis no stenosis. [name] was last seen on June 06, 2018 by Telehealth to follow-up test results syncopal episode presumably related to dehydration and vasovagal syncope.  We reviewed results of echocardiogram, event monitor and carotid Dopplers.  Event monitor was essentially normal.  No arrhythmias or significant bradycardia.  Rare PVCs.  Echo was relatively stable.  Carotids also relatively normal.  Recent Hospitalizations: None  Reviewed  CV studies:    The following studies were reviewed today: (if available, images/films reviewed: From Epic Chart or Care Everywhere) . none:  Interval History:   [name] returns today as usual feeling well.  He never really does have any symptoms of chest pain or pressure with rest or exertion, never did despite having documented CAD.  No heart failure symptoms or any arrhythmias.  Not being over the [facility], he is try to make the best of what he can do.  He is using a  treadmill downstairs in the basement.  He does at least 3 to 4 days a week for 20 to 30 minutes.  He is also doing the routine yard work and walking in the neighborhood with his wife.  He tried to do the best he can to avoid the drop in exercise level because of Covid lockdown.  He is also been paying much more attention to what he eats, eating less.  CV Review of Symptoms (Summary) Cardiovascular ROS: no chest pain or dyspnea on exertion positive for - Just slowing up a little bit.  Not able to be quite as much as he used to. negative for - edema, irregular heartbeat, orthopnea, palpitations, paroxysmal nocturnal dyspnea, rapid heart rate, shortness of breath or Syncope/near syncope, TIA/amaurosis fugax.  Claudication  The patient does not have symptoms concerning for COVID-19 infection (fever, chills, cough, or new shortness of breath).  The patient is practicing social distancing. ++ Masking.  Occasionally goes out for groceries/shopping.  Otherwise, staying separate and "safe "   REVIEWED OF SYSTEMS   A comprehensive ROS was performed. Review of Systems  Constitutional: Positive for weight loss (Intentional with picked up levels of exercise and dietary modification.). Negative for malaise/fatigue.  HENT: Negative for congestion and nosebleeds.   Respiratory: Negative for shortness of breath.   Cardiovascular: Negative for claudication.  Gastrointestinal: Positive for heartburn. Negative for blood in stool and melena.  Genitourinary: Negative for hematuria.  Musculoskeletal: Positive for back pain and joint pain. Negative for  falls.  Neurological: Negative for dizziness, focal weakness, loss of consciousness (No further episodes of passing out.), weakness and headaches.  Psychiatric/Behavioral: Negative.  Negative for depression and memory loss. The patient is not nervous/anxious and does not have insomnia.   All other systems reviewed and are negative.  I have reviewed and (if needed)  personally updated the patient's problem list, medications, allergies, past medical and surgical history, social and family history.   PAST MEDICAL HISTORY   Past Medical History:  Diagnosis Date  . 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 Ellyn Hack (Cardiologist): For Abnormal ST: CABG X 5; cath 03/2012 --> LIMA-LAD - patent, SVG-Ramus - anastamotic 80% - DES stent; SVG-RCA & SVG-OM1-OM2-- occluded; DES Stent to Cx-OM1  . Cardiomyopathy, ischemic- moderate LVD (EF of roughly 45%) at cath 2/143 11/03/2012  . Carotid artery narrowings 03/2018   Moderate L ICA 40-59%  . Carpal tunnel syndrome on right   . Chronic back pain    spinal stenosis and scoliosis  . DDD (degenerative disc disease), lumbosacral   . Diabetes type 2, controlled ([place])    takes Metformin once a wk  . 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 Dyslipidemia    takes Simvastatin daily  . First degree heart block   . GERD (gastroesophageal reflux disease)   . History of colon polyps    benign  . History of shingles    forehead --  dec 2016--  no residual pain  . Hypertension    takes Coreg,HCTZ,and Micardis daily  . Osteoarthritis   . Peripheral arterial disease [facility]) May 2016  (seen by dr berry)   L. ABI 0.58 with high grade L. SFA stenosis and occluded L. DP. R. ABI 0.48 with occluded R. SFA and occluded R. DP.  . Pneumonia    hx of-as a child  . Weakness    numbness and tingling     PAST SURGICAL HISTORY   Past Surgical History:  Procedure Laterality Date  . CARDIAC CATHETERIZATION  05/15/2010   abnormal stress test-- 3 vessel CAD/  LVEDP=15mmHg  (Dr. Debara Pickett for Dr,. Ellyn Hack)   . Carotid Dopplers  03/2018    Normal LV size and function.  EF 55 to 60%.  No R WMA.  Impaired relaxation (GR 1 DD).  Moderate LA dilation.  Moderate aortic thickening and calcification-sclerosis with no stenosis.  . CARPAL TUNNEL RELEASE Right 10/10/2015   Procedure: RIGHT HAND CARPAL TUNNEL RELEASE;  Surgeon: Iran Planas, MD;  Location: [place];  Service: Orthopedics;  Laterality: Right;  . COLONOSCOPY    . CORONARY ANGIOPLASTY     3 stents  . CORONARY ARTERY BYPASS GRAFT  05/22/2010  dr Lucianne Lei tright   x 5 (LIMA-LAD, SVG- RI,. SVG-OM1-OM2, SVG-dRCA)  . DECOMPRESSIVE LUMBAR LAMINECTOMY LEVEL 1 N/A 03/08/2016   Procedure: Lumbar 3-5 decompression with in situ fusion;  Surgeon: Melina Schools, MD;  Location: [place];  Service: Orthopedics;  Laterality: N/A;  . LEFT HEART CATHETERIZATION WITH CORONARY ANGIOGRAM N/A 04/02/2012   Procedure: LEFT HEART CATHETERIZATION WITH CORONARY ANGIOGRAM;  Surgeon: Leonie Man, MD;  Location: [facility] CATH LAB: Occluded SVG-rPDA & native RCA, 100% SVG-OM. Severe anastomotic lesion in SVG-RI.  Patent LIMA-LAD. Severe mid Cx-OM  . NM MYOVIEW LTD  03/25/2012   Dr. Ellyn Hack: High Risk exercise nuclear study w/ scar in the apex and upper mid to basal inferior wall w/ signgicnat additional iscemia inferiorly to lateral-inferiorly from apex to base/  severe LV dysfunction w/ infero-apical dyskinesis and hypocontractility in the inferior and lateral wall, ef 33%  . NM MYOVIEW LTD  04/2016   large-size, moderate intensity reversible perfusion defect in the inferior wall suggestive of ischemia. EF 34%. Global hypokinesis and severe inferior hypokinesis to akinesis. Also noted horizontal ST segment depression in inferior-lateralleads. HIGH RISK  . PERCUTANEOUS CORONARY STENT INTERVENTION (PCI-S) N/A 04/08/2012   Procedure: PERCUTANEOUS CORONARY STENT INTERVENTION (PCI-S);  Surgeon: Leonie Man, MD;  Location: [facility] CATH LAB: Promus Premier DES 2.25 mm x 12 mm - Mid Cx-OM1; Promus Premier DES 2.5 mm x 16 mm -- anastomotic SVG-RI (graft into native)  . TOTAL KNEE ARTHROPLASTY Right 10/26/2010  . TRANSTHORACIC ECHOCARDIOGRAM  08/21/2010   mild LVH, EF 45-50%, inferior hypokinesis,  grade 1 diastolic dysfunction/  moderate LAE/   mild MR/  trivial TR/  mild dilated RV  .  TRANSTHORACIC ECHOCARDIOGRAM  03/2018    Normal LV size and function.  EF 55 to 60%.  No R WMA.  Impaired relaxation (GR 1 DD).  Moderate LA dilation.  Moderate aortic thickening and calcification-sclerosis with no stenosis.  Marland Kitchen UMBILICAL HERNIA REPAIR  1980's    Event Monitor February-March 2020: Predominantly sinus rhythm with sinus tach bradycardia and tachycardia (minimum heart rate 52 bpm, max 124 bpm.  Average 69 bpm).  Occasional PVCs but no pauses or arrhythmias.  No bradycardia. -->  Essentially normal.  Carotid Dopplers March 20, 2018:: Moderate left internal carotid 40-59%.  Mild right carotid less than 39%.  Bilateral vertebral and subclavian arteries normal. ->  Recommend one-year follow-up.  MEDICATIONS/ALLERGIES   Current Meds  Medication Sig  . atorvastatin (LIPITOR) 20 MG tablet TAKE 1 TABLET BY MOUTH EVERY DAY  . carvedilol (COREG) 25 MG tablet TAKE 1 TABLET (25 MG TOTAL) BY MOUTH 2 (TWO) TIMES DAILY.  Marland Kitchen clopidogrel (PLAVIX) 75 MG tablet TAKE 1 TABLET BY MOUTH EVERY DAY  . fish oil-omega-3 fatty acids 1000 MG capsule Take 1 g by mouth daily.   . metFORMIN (GLUCOPHAGE) 500 MG tablet Take 500 mg by mouth daily with breakfast.  . nitroGLYCERIN (NITROSTAT) 0.4 MG SL tablet Place 1 tablet (0.4 mg total) under the tongue every 5 (five) minutes as needed for chest pain.  Marland Kitchen telmisartan (MICARDIS) 80 MG tablet TAKE 2 TABLETS BY MOUTH DAILY    Allergies  Allergen Reactions  . No Known Allergies      SOCIAL HISTORY/FAMILY HISTORY   Social History   Tobacco Use  . Smoking status: Former Smoker    Types: Cigarettes    Quit date: 09/26/1980    Years since quitting: 38.2  . Smokeless tobacco: Never Used  Substance Use Topics  . Alcohol use: No  . Drug use: No   Social History   Social History Narrative   He is a married father of 2. He exercises routinely at the [facility].Marland Kitchen    He quit smoking about 30 years ago and has social alcohol.     Family History family history  includes Cancer in his mother.   OBJCTIVE -PE, EKG, labs   Wt Readings from Last 3 Encounters:  12/22/18 214 lb (97.1 kg)  06/06/18 211 lb (95.7 kg)  03/07/18 223 lb 9.6 oz (101.4 kg)    Physical Exam: BP (!) 148/71   Pulse 68   Ht 6\' 2"  (1.88 m)   Wt 214 lb (97.1 kg)   SpO2 99%   BMI 27.48 kg/m  Physical Exam  Constitutional: He is  oriented to person, place, and time. He appears well-developed and well-nourished. No distress.  HENT:  Head: Normocephalic and atraumatic.  Neck: Normal range of motion. Neck supple. No hepatojugular reflux present. Carotid bruit is not present.  Cardiovascular: Normal rate, regular rhythm, S1 normal, S2 normal and intact distal pulses.  Occasional extrasystoles are present. PMI is not displaced. Exam reveals distant heart sounds. Exam reveals no gallop and no friction rub.  No murmur heard. Pulmonary/Chest: Effort normal and breath sounds normal. No respiratory distress. He has no wheezes. He has no rales.  Abdominal: Soft. Bowel sounds are normal. He exhibits no distension. There is no abdominal tenderness. There is no rebound.  No HSM  Musculoskeletal: Normal range of motion.        General: No edema (Trivial).  Neurological: He is alert and oriented to person, place, and time.  Psychiatric: He has a normal mood and affect. His behavior is normal. Judgment and thought content normal.  Vitals reviewed.   Adult ECG Report  Rate: 68 ;  Rhythm: normal sinus rhythm and Voltage criteria for LVH.  Borderline criteria for inferior MI, age-indeterminate.  Otherwise normal axis, intervals and durations.;   Narrative Interpretation: Stable EKG  Recent Labs: October 23, 2018: TC 155, HDL 37, triglycerides 124.  LDL C not listed.  A1c 6.2.  ASSESSMENT/PLAN    Problem List Items Addressed This Visit    CAD, CABG X 18 May 2010, progression at cath 04/02/12 -- status post PCI to SVG-RI & native Cx-OM. - Primary (Chronic)    Interestingly, despite  having significant native CAD and occluded RCA system, he seems to be doing fine.  He had a inferior perfusion defect on Myoview that goes along with known anatomy.  Also now has stents in the native circumflex and OM as well as the SVG-RI.  He is on atorvastatin and appears to have relatively well-controlled lipids.      Relevant Medications   hydrochlorothiazide (HYDRODIURIL) 25 MG tablet   Other Relevant Orders   EKG 12-Lead (Completed)   S/P elective  PTCA / DES to SVG-RI and OM1 04/08/12 (Chronic)    He has PCI to both vein graft and native artery.  He is on Plavix without aspirin.  At his troponin without now that he can hold Plavix for surgery.  He is otherwise on stable regimen as noted with beta-blocker, ARB and statin.      Relevant Orders   EKG 12-Lead (Completed)   Essential hypertension (Chronic)    Blood pressure is little high today.  Relatively unusual for him.  He is on max dose carvedilol and telmisartan.  We can increase his HCTZ to 25 mg daily       Relevant Medications   hydrochlorothiazide (HYDRODIURIL) 25 MG tablet   Dyslipidemia associated with type 2 diabetes mellitus ([place]) (Chronic)    Lipids followed by PCP.  Last checked look like it was pretty well controlled, but did not have the LDL available.  He is on stable dose of atorvastatin with no complaints of myalgias.  Target LDL is less than 70.      Cardiomyopathy, ischemic-EF improved from 45% post CABG up to 55-60% (Chronic)    Resolved cardiomyopathy with improved EF.  Has diastolic dysfunction.  He is on max dose carvedilol and ARB.  On HCTZ but not requiring loop diuretic.      Relevant Medications   hydrochlorothiazide (HYDRODIURIL) 25 MG tablet   Other Relevant Orders   EKG 12-Lead (Completed)  Peripheral arterial disease (H[facility]) (Chronic)    No claudication.  Mild carotid disease.  Will be due for follow-up evaluation at roughly 67-month follow-up.      Relevant Medications    hydrochlorothiazide (HYDRODIURIL) 25 MG tablet   Abnormal nuclear stress test (Chronic)    Totally asymptomatic with grossly abnormal stress test.  The distribution noted on the stress test was consistent with known occluded RCA and graft to the RCA.  Not much to be done from a revascularization standpoint.  As such, I am really reluctant to recheck a stress test, although he was totally asymptomatic despite having pretty significant disease.  We can discuss follow-up stress test in a couple years.      Carotid artery narrowings (Chronic)    Moderate left carotid disease follow-up Dopplers next spring.  Can order at next visit.      Relevant Medications   hydrochlorothiazide (HYDRODIURIL) 25 MG tablet       COVID-19 Education: The signs and symptoms of COVID-19 were discussed with the patient and how to seek care for testing (follow up with PCP or arrange E-visit).   The importance of social distancing was discussed today.  I spent a total of 26minutes with the patient and chart review. >  50% of the time was spent in direct patient consultation.  Additional time spent with chart review (studies, outside notes, etc): 8 Total Time: 25 min   Current medicines are reviewed at length with the patient today.  (+/- concerns) n/a  => ? Pre-Colonoscopy --> OK to hold Plavix 5-7 d pre-procedure  Patient Instructions / Medication Changes & Studies & Tests Ordered   Patient Instructions  Medication Instructions:  Increase  To HCTZ ( HYDROCHLOROTHIAZIDE ) TO 25 MG DAILY   *If you need a refill on your cardiac medications before your next appointment, please call your pharmacy*  Lab Work: Not needed  Testing/Procedures: Not needed  Follow-Up: At [facility], you and your health needs are our priority.  As part of our continuing mission to provide you with exceptional heart care, we have created designated Provider Care Teams.  These Care Teams include your primary Cardiologist  (physician) and Advanced Practice Providers (APPs -  Physician Assistants and Nurse Practitioners) who all work together to provide you with the care you need, when you need it.  Your next appointment:   6 months  The format for your next appointment:   In Person  Provider:   Glenetta Hew, MD  Other Instructions    Studies Ordered:   Orders Placed This Encounter  Procedures  . EKG 12-Lead     [name], M.D., M.S. Interventional Cardiologist   Pager # [phone] Phone # [phone] [address] [place], [place] [address]   Thank you for choosing Heartcare at [facility]!!

## 2018-12-22 NOTE — Patient Instructions (Signed)
Medication Instructions:  Increase  To HCTZ ( HYDROCHLOROTHIAZIDE ) TO 25 MG DAILY   *If you need a refill on your cardiac medications before your next appointment, please call your pharmacy*  Lab Work: Not needed  Testing/Procedures: Not needed  Follow-Up: At [facility], you and your health needs are our priority.  As part of our continuing mission to provide you with exceptional heart care, we have created designated Provider Care Teams.  These Care Teams include your primary Cardiologist (physician) and Advanced Practice Providers (APPs -  Physician Assistants and Nurse Practitioners) who all work together to provide you with the care you need, when you need it.  Your next appointment:   6 months  The format for your next appointment:   In Person  Provider:   Glenetta Hew, MD  Other Instructions

## 2018-12-23 ENCOUNTER — Encounter: Payer: Self-pay | Admitting: Cardiology

## 2018-12-23 ENCOUNTER — Telehealth: Payer: Self-pay | Admitting: Cardiology

## 2018-12-23 NOTE — Assessment & Plan Note (Signed)
Blood pressure is little high today.  Relatively unusual for him.  He is on max dose carvedilol and telmisartan.  We can increase his HCTZ to 25 mg daily

## 2018-12-23 NOTE — Telephone Encounter (Signed)
Patient's wife called she has some questions about [name] medication that she would like clarified.

## 2018-12-23 NOTE — Assessment & Plan Note (Addendum)
No claudication.  Mild carotid disease.  Will be due for follow-up evaluation at roughly 61-month follow-up.

## 2018-12-23 NOTE — Assessment & Plan Note (Signed)
Totally asymptomatic with grossly abnormal stress test.  The distribution noted on the stress test was consistent with known occluded RCA and graft to the RCA.  Not much to be done from a revascularization standpoint.  As such, I am really reluctant to recheck a stress test, although he was totally asymptomatic despite having pretty significant disease.  We can discuss follow-up stress test in a couple years.

## 2018-12-23 NOTE — Assessment & Plan Note (Signed)
Resolved cardiomyopathy with improved EF.  Has diastolic dysfunction.  He is on max dose carvedilol and ARB.  On HCTZ but not requiring loop diuretic.

## 2018-12-23 NOTE — Assessment & Plan Note (Signed)
Moderate left carotid disease follow-up Dopplers next spring.  Can order at next visit.

## 2018-12-23 NOTE — Assessment & Plan Note (Signed)
Interestingly, despite having significant native CAD and occluded RCA system, he seems to be doing fine.  He had a inferior perfusion defect on Myoview that goes along with known anatomy.  Also now has stents in the native circumflex and OM as well as the SVG-RI.  He is on atorvastatin and appears to have relatively well-controlled lipids.

## 2018-12-23 NOTE — Assessment & Plan Note (Signed)
Lipids followed by PCP.  Last checked look like it was pretty well controlled, but did not have the LDL available.  He is on stable dose of atorvastatin with no complaints of myalgias.  Target LDL is less than 70.

## 2018-12-23 NOTE — Assessment & Plan Note (Signed)
He has PCI to both vein graft and native artery.  He is on Plavix without aspirin.  At his troponin without now that he can hold Plavix for surgery.  He is otherwise on stable regimen as noted with beta-blocker, ARB and statin.

## 2018-12-23 NOTE — Telephone Encounter (Signed)
Called and spoke to wife- patient had visit yesterday; his HCTZ was increased to 25 mg daily, from 12.5 mg. But wife states that PCP already had patient taking the HCTZ 25 mg daily- he has been on that for a while now, he was not taking 12.5 mg. I advised I would notify MD and see what recommendations he would have since already been taking 25 mg daily.

## 2018-12-24 DIAGNOSIS — Z86010 Personal history of colonic polyps: Secondary | ICD-10-CM | POA: Insufficient documentation

## 2018-12-24 NOTE — Telephone Encounter (Signed)
Spoke to wife . She states she will monitor patient's blood pressure and contact office if  pressure stays above [id number] systolic for than 2 weeks or more contact office.

## 2018-12-24 NOTE — Telephone Encounter (Signed)
Okay.  It is hard to know how to treat people when the medication list is not correct when I am seeing the patient.  If that is what he is taking, then we certainly can titrate any further.  Just ask him to monitor his blood pressures and make sure that not staying consistently above [id number] systolic.  Glenetta Hew, MD Psych

## 2019-01-02 ENCOUNTER — Telehealth: Payer: Self-pay | Admitting: Cardiology

## 2019-01-02 NOTE — Telephone Encounter (Signed)
If the blood pressures are higher later in the day, take the ARB (telmisartan) at lunchtime so that it covers more effective in the afternoon as opposed to the morning.  This will help in the morning low blood pressures and decrease the nighttime high pressures.  Glenetta Hew, MD

## 2019-01-02 NOTE — Telephone Encounter (Signed)
Patient's wife wanted to share [name] bloodpressure readings with Trixie Dredge, RN 12/25/18--116/60  Pulse 69 12/26/18--117/58  Pulse 79 12/27/18--156/69  Pulse 70 12/28/18--173/78  Pulse 77 12/29/18--160/81  Pulse 70 12/30/18--136/63  Pulse 71 12/31/18--136/63  Pulse 67 01/01/19--142/60  Pulse 71

## 2019-01-02 NOTE — Telephone Encounter (Signed)
Spoke with pt's wife and some of the higher readings appear are later in the day  The 173/78 was 8:00 pm and some of the lower readings are from mid day Per pt's wife will make sure is taking the meds appropriately as well Will forward to Dr Ellyn Hack for review and recommendations ./cy

## 2019-01-05 NOTE — Telephone Encounter (Signed)
.  instruction given to wife  She states patient is having an colonoscopy in dec 2020 ( per last  Office note okay to hold plavix 5 - 7 days)  wife states she has some more reading to give  11/20 /20 99/64  p 84 at 1 pm 01/03/19  152/96 p 75 at 3 pm 01/04/19 132/65 p  65  at 12 noon   Wife voice understanding

## 2019-03-23 ENCOUNTER — Encounter (INDEPENDENT_AMBULATORY_CARE_PROVIDER_SITE_OTHER): Payer: Self-pay

## 2019-03-23 ENCOUNTER — Ambulatory Visit (HOSPITAL_COMMUNITY)
Admission: RE | Admit: 2019-03-23 | Discharge: 2019-03-23 | Disposition: A | Discharge status: Home / Self Care | Payer: Medicare Other | Source: Ambulatory Visit | Attending: Cardiology | Admitting: Cardiology

## 2019-03-23 ENCOUNTER — Other Ambulatory Visit: Payer: Self-pay

## 2019-03-23 ENCOUNTER — Other Ambulatory Visit (HOSPITAL_COMMUNITY): Payer: Self-pay | Admitting: Cardiology

## 2019-03-23 DIAGNOSIS — I6523 Occlusion and stenosis of bilateral carotid arteries: Secondary | ICD-10-CM

## 2019-03-31 ENCOUNTER — Telehealth: Payer: Self-pay | Admitting: *Deleted

## 2019-03-31 DIAGNOSIS — I739 Peripheral vascular disease, unspecified: Secondary | ICD-10-CM

## 2019-03-31 DIAGNOSIS — I6523 Occlusion and stenosis of bilateral carotid arteries: Secondary | ICD-10-CM

## 2019-03-31 NOTE — Telephone Encounter (Signed)
The patient's wife per dpr. has been notified of the result and verbalized understanding.  All questions (if any) were answered.  Wife aware  CTA of head and neck will be ordered per Dr Ellyn Hack to follow up  Raiford Simmonds, RN 03/31/2019 1:44 PM

## 2019-03-31 NOTE — Telephone Encounter (Signed)
-----   Message from Leonie Man, MD sent at 03/28/2019  1:05 PM EST ----- Carotid Dopplers show less than 40% in the right internal carotid.  There is low end of moderate (40 to 59%) stenosis in the left internal carotid.  The right vertebral artery shows high resistant flow in both subclavian arteries are normal flow.  This would argue that it may be significant for right vertebral artery disease.  Think we should at least confirm if there is or is not vertebral artery stenosis.  This can be done with CTA of head and neck.  Glenetta Hew, MD

## 2019-04-06 ENCOUNTER — Telehealth: Payer: Self-pay | Admitting: *Deleted

## 2019-04-06 ENCOUNTER — Other Ambulatory Visit: Payer: Self-pay | Admitting: *Deleted

## 2019-04-06 DIAGNOSIS — I6529 Occlusion and stenosis of unspecified carotid artery: Secondary | ICD-10-CM

## 2019-04-06 DIAGNOSIS — Z79899 Other long term (current) drug therapy: Secondary | ICD-10-CM

## 2019-04-06 DIAGNOSIS — I6523 Occlusion and stenosis of bilateral carotid arteries: Secondary | ICD-10-CM

## 2019-04-06 NOTE — Telephone Encounter (Signed)
Spoke with [name] regarding appointment for CTA neck scheduled Friday 04/10/19 at [facility]---arrival time is 4:15pm 1st floor admissions office Liquids only 4 hours prior to study----pt will go to [facility] Tuesday 04/06/19 for labs

## 2019-04-07 ENCOUNTER — Other Ambulatory Visit: Payer: Self-pay

## 2019-04-07 ENCOUNTER — Other Ambulatory Visit (HOSPITAL_COMMUNITY)
Admission: RE | Admit: 2019-04-07 | Discharge: 2019-04-07 | Disposition: A | Discharge status: Home / Self Care | Payer: Medicare Other | Source: Ambulatory Visit | Attending: Internal Medicine | Admitting: Internal Medicine

## 2019-04-07 DIAGNOSIS — I6529 Occlusion and stenosis of unspecified carotid artery: Secondary | ICD-10-CM | POA: Insufficient documentation

## 2019-04-07 DIAGNOSIS — Z79899 Other long term (current) drug therapy: Secondary | ICD-10-CM | POA: Insufficient documentation

## 2019-04-07 DIAGNOSIS — I6523 Occlusion and stenosis of bilateral carotid arteries: Secondary | ICD-10-CM | POA: Insufficient documentation

## 2019-04-07 LAB — BASIC METABOLIC PANEL
Anion gap: 9 (ref 5–15)
BUN: 25 mg/dL — ABNORMAL HIGH (ref 8–23)
CO2: 22 mmol/L (ref 22–32)
Calcium: 9.6 mg/dL (ref 8.9–10.3)
Chloride: 100 mmol/L (ref 98–111)
Creatinine, Ser: 1.47 mg/dL — ABNORMAL HIGH (ref 0.61–1.24)
GFR calc Af Amer: 53 mL/min — ABNORMAL LOW (ref >60)
GFR calc non Af Amer: 46 mL/min — ABNORMAL LOW (ref >60)
Glucose, Bld: 139 mg/dL — ABNORMAL HIGH (ref 70–99)
Potassium: 4.1 mmol/L (ref 3.5–5.1)
Sodium: 131 mmol/L — ABNORMAL LOW (ref 135–145)

## 2019-04-10 ENCOUNTER — Other Ambulatory Visit: Payer: Self-pay

## 2019-04-10 ENCOUNTER — Ambulatory Visit (HOSPITAL_COMMUNITY)
Admission: RE | Admit: 2019-04-10 | Discharge: 2019-04-10 | Disposition: A | Discharge status: Home / Self Care | Payer: Medicare Other | Source: Ambulatory Visit | Attending: Cardiology | Admitting: Cardiology

## 2019-04-10 DIAGNOSIS — I739 Peripheral vascular disease, unspecified: Secondary | ICD-10-CM | POA: Insufficient documentation

## 2019-04-10 DIAGNOSIS — I6523 Occlusion and stenosis of bilateral carotid arteries: Secondary | ICD-10-CM | POA: Insufficient documentation

## 2019-04-10 MED ORDER — IOHEXOL 350 MG/ML SOLN
75.0000 mL | Freq: Once | INTRAVENOUS | Status: AC | PRN
  Administered 2019-04-10: 75 mL via INTRAVENOUS

## 2019-04-16 ENCOUNTER — Telehealth: Payer: Self-pay | Admitting: *Deleted

## 2019-04-16 DIAGNOSIS — I6523 Occlusion and stenosis of bilateral carotid arteries: Secondary | ICD-10-CM

## 2019-04-16 DIAGNOSIS — I739 Peripheral vascular disease, unspecified: Secondary | ICD-10-CM

## 2019-04-16 MED ORDER — HYDROCHLOROTHIAZIDE 25 MG PO TABS: ORAL_TABLET | ORAL | 3 refills | Status: DC

## 2019-04-16 NOTE — Telephone Encounter (Signed)
Spoke to wife about result CTA neck.  She had question about  Instruction for labs.  Question answered. Verbalized understanding.  Changed medication list to reflect changes

## 2019-04-16 NOTE — Telephone Encounter (Signed)
-----   Message from Leonie Man, MD sent at 04/14/2019  8:37 PM EST ----- Kidney function shows that he may be just a little dry side.  Recommend holding HCTZ for couple days and increasing fluid intake but 2 to 3 cups.  Reduced dose of HCTZ to one half tabs on weekends.  Glenetta Hew, MD

## 2019-04-16 NOTE — Telephone Encounter (Signed)
-----   Message from Leonie Man, MD sent at 04/14/2019  8:36 PM EST ----- Neck artery CT scan results: There is moderate disease in the right internal carotid roughly 40%, and 65% in the left internal carotid (we need to watch this closely.  Both vertebral arteries (the smaller arteries to go to the head) are 100% blocked as they come off the main branch and then filling by collaterals (natures bypasses) and continue on up to the brain.  There is significant atherosclerotic disease in both arteries.  At this point I think it may recommend referral to vascular surgery simply to follow this closely because of the vertebral artery disease (although we cannot do much about that), carotids become more important.  Glenetta Hew, MD   Please refer to vascular surgery.

## 2019-04-16 NOTE — Telephone Encounter (Signed)
The patient and wife has been notified of the result and verbalized understanding.  All questions (if any) were answered. The name and address  And phone number .given to wife .  Information  given on recent call about lab work-  HCTZ changes. she  verbalized understanding. Raiford Simmonds, RN 04/16/2019 3:11 PM

## 2019-04-20 ENCOUNTER — Encounter: Payer: Self-pay | Admitting: Surgery

## 2019-04-20 ENCOUNTER — Other Ambulatory Visit: Payer: Self-pay

## 2019-04-20 ENCOUNTER — Ambulatory Visit (INDEPENDENT_AMBULATORY_CARE_PROVIDER_SITE_OTHER): Payer: Medicare Other | Admitting: Surgery

## 2019-04-20 VITALS — BP 137/63 | HR 71 | Temp 97.4°F | Resp 20 | Ht 74.0 in | Wt 216.0 lb

## 2019-04-20 DIAGNOSIS — I6523 Occlusion and stenosis of bilateral carotid arteries: Secondary | ICD-10-CM

## 2019-04-20 NOTE — Progress Notes (Signed)
[facility]  Patient name: [name] MRN: [id number] DOB: 05-Sep-1940 Sex: male   REQUESTING PROVIDER:    Dr. Ellyn Hack   REASON FOR CONSULT:    Carotid and vertebral disease  HISTORY OF PRESENT ILLNESS:   [name] is a 79 y.o. 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  The patient has a history of CABG as well as PCI.  He did have a syncopal episode and 2020 which led to a work-up which included carotid Dopplers that showed bilateral vertebral artery occlusion which was confirmed with CT scan.  He was referred for further evaluation.  Patient states that he does not get dizzy or lose his balance.  He reports no focal symptoms such as numbness or weakness in either extremity.  He denies he is a former smoker.  Blurry vision or amaurosis.  He does not feel he has balance issues.  The patient does take a statin for hypercholesterolemia.  He is medically managed for hypertension as well as diabetes.  He has ischemic cardiomyopathy which has improved since CABG.  PAST MEDICAL HISTORY    Past Medical History:  Diagnosis Date  . 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 Ellyn Hack (Cardiologist): For Abnormal ST: CABG X 5; cath 03/2012 --> LIMA-LAD - patent, SVG-Ramus - anastamotic 80% - DES stent; SVG-RCA & SVG-OM1-OM2-- occluded; DES Stent to Cx-OM1  . Cardiomyopathy, ischemic- moderate LVD (EF of roughly 45%) at cath 2/143 11/03/2012  . Carotid artery narrowings 03/2018   Moderate L ICA 40-59%  . Carpal tunnel syndrome on right   . Chronic back pain    spinal stenosis and scoliosis  . DDD (degenerative disc disease), lumbosacral   . Diabetes type 2, controlled ([place])    takes Metformin once a wk  . 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 Dyslipidemia    takes Simvastatin daily  . First degree heart block   . GERD (gastroesophageal reflux disease)   . History  of colon polyps    benign  . History of shingles    forehead --  dec 2016--  no residual pain  . Hypertension    takes Coreg,HCTZ,and Micardis daily  . Osteoarthritis   . Peripheral arterial disease [facility]) May 2016  (seen by dr berry)   L. ABI 0.58 with high grade L. SFA stenosis and occluded L. DP. R. ABI 0.48 with occluded R. SFA and occluded R. DP.  . Pneumonia    hx of-as a child  . Weakness    numbness and tingling     FAMILY HISTORY   Family History  Problem Relation Age of Onset  . Cancer Mother     SOCIAL HISTORY:   Social History   Socioeconomic History  . Marital status: Married    Spouse name: Not on file  . Number of children: Not on file  . Years of education: Not on file  . Highest education level: Not on file  Occupational History  . Not on file  Tobacco Use  . Smoking status: Former Smoker    Types: Cigarettes    Quit date: 09/26/1980    Years since quitting: 38.5  . Smokeless tobacco: Never Used  Substance and Sexual Activity  . Alcohol use: No  . Drug use: No  . Sexual activity: Not on file  Other Topics Concern  . Not on file  Social History Narrative   He is  a married father of 2. He exercises routinely at the [facility].Marland Kitchen    He quit smoking about 30 years ago and has social alcohol.    Social Determinants of Health   Financial Resource Strain:   . Difficulty of Paying Living Expenses: Not on file  Food Insecurity:   . Worried About Charity fundraiser in the Last Year: Not on file  . Ran Out of Food in the Last Year: Not on file  Transportation Needs:   . Lack of Transportation (Medical): Not on file  . Lack of Transportation (Non-Medical): Not on file  Physical Activity:   . Days of Exercise per Week: Not on file  . Minutes of Exercise per Session: Not on file  Stress:   . Feeling of Stress : Not on file  Social Connections:   . Frequency of Communication with Friends and Family: Not on file  . Frequency of Social Gatherings with  Friends and Family: Not on file  . Attends Religious Services: Not on file  . Active Member of Clubs or Organizations: Not on file  . Attends Archivist Meetings: Not on file  . Marital Status: Not on file  Intimate Partner Violence:   . Fear of Current or Ex-Partner: Not on file  . Emotionally Abused: Not on file  . Physically Abused: Not on file  . Sexually Abused: Not on file    ALLERGIES:    Allergies  Allergen Reactions  . No Known Allergies     CURRENT MEDICATIONS:    Current Outpatient Medications  Medication Sig Dispense Refill  . atorvastatin (LIPITOR) 20 MG tablet TAKE 1 TABLET BY MOUTH EVERY DAY 90 tablet 3  . carvedilol (COREG) 25 MG tablet TAKE 1 TABLET (25 MG TOTAL) BY MOUTH 2 (TWO) TIMES DAILY. 180 tablet 1  . clopidogrel (PLAVIX) 75 MG tablet TAKE 1 TABLET BY MOUTH EVERY DAY 90 tablet 3  . fish oil-omega-3 fatty acids 1000 MG capsule Take 1 g by mouth daily.     . hydrochlorothiazide (HYDRODIURIL) 25 MG tablet Take 25 mg( 1 tablet)  by mouth daily ,except on Saturday and Sundays take 12.5 mg ( 1/2 tablet) a day 90 tablet 3  . metFORMIN (GLUCOPHAGE) 500 MG tablet Take 500 mg by mouth daily with breakfast.    . nitroGLYCERIN (NITROSTAT) 0.4 MG SL tablet Place 1 tablet (0.4 mg total) under the tongue every 5 (five) minutes as needed for chest pain. 25 tablet 2  . telmisartan (MICARDIS) 80 MG tablet TAKE 2 TABLETS BY MOUTH DAILY 180 tablet 3  . triamcinolone cream (KENALOG) 0.1 % EVERY DAY     No current facility-administered medications for this visit.    REVIEW OF SYSTEMS:   [X]  denotes positive finding, [ ]  denotes negative finding Cardiac  Comments:  Chest pain or chest pressure:    Shortness of breath upon exertion:    Short of breath when lying flat:    Irregular heart rhythm:        Vascular    Pain in calf, thigh, or hip brought on by ambulation:    Pain in feet at night that wakes you up from your sleep:     Blood clot in your veins:      Leg swelling:         Pulmonary    Oxygen at home:    Productive cough:     Wheezing:         Neurologic    Sudden  weakness in arms or legs:     Sudden numbness in arms or legs:     Sudden onset of difficulty speaking or slurred speech:    Temporary loss of vision in one eye:     Problems with dizziness:         Gastrointestinal    Blood in stool:      Vomited blood:         Genitourinary    Burning when urinating:     Blood in urine:        Psychiatric    Major depression:         Hematologic    Bleeding problems:    Problems with blood clotting too easily:        Skin    Rashes or ulcers:        Constitutional    Fever or chills:     PHYSICAL EXAM:   Vitals:   04/20/19 1014 04/20/19 1017  BP: 134/68 137/63  Pulse: 71   Resp: 20   Temp: (!) 97.4 F (36.3 C)   SpO2: 99%   Weight: 216 lb (98 kg)   Height: 6\' 2"  (1.88 m)     GENERAL: The patient is a well-nourished male, in no acute distress. The vital signs are documented above. CARDIAC: There is a regular rate and rhythm.  VASCULAR: Palpable radial pulses bilaterally.  I had difficulty palpating femoral pulse PULMONARY: Nonlabored respirations ABDOMEN: Soft and non-tender with normal pitched bowel sounds.  MUSCULOSKELETAL: There are no major deformities or cyanosis. NEUROLOGIC: No focal weakness or paresthesias are detected. SKIN: There are no ulcers or rashes noted. PSYCHIATRIC: The patient has a normal affect.  STUDIES:   I have reviewed the following:  Aortic atherosclerosis.  Atherosclerotic disease at the right carotid bifurcation and ICA bulb with soft and calcified plaque. Stenosis of 40% in the ICA bulb.  Atherosclerotic disease at the left carotid bifurcation and ICA bulb with narrowing and irregularity. Stenosis of 65% in the ICA bulb.  Both vertebral arteries are occluded at their origins and reconstituted in the neck by cervical collaterals. The vessels do show antegrade flow  through the foramen magnum to the basilar, but demonstrate atherosclerotic disease in both vertebral artery V4 segments  ASSESSMENT and PLAN   Carotid artery stenosis: The patient is asymptomatic.  The left carotid shows the grade of stenosis which measures 65%.  No intervention is recommended at this time.  I will have him follow-up in 6 months with repeat carotid duplex.  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  They do appear to reconstitute in the V4 section.  Fortunately, he really does not exhibit any symptoms of vertebral artery insufficiency.  I suspect he is well compensated intracranially.  I would not recommend revascularization of his vertebral arteries at this time.   Leia Alf, MD, FACS [facility] [phone] Pager [phone]

## 2019-04-21 ENCOUNTER — Other Ambulatory Visit: Payer: Self-pay | Admitting: *Deleted

## 2019-04-21 DIAGNOSIS — I6523 Occlusion and stenosis of bilateral carotid arteries: Secondary | ICD-10-CM

## 2019-04-25 ENCOUNTER — Other Ambulatory Visit: Payer: Self-pay | Admitting: Cardiology

## 2019-05-07 ENCOUNTER — Other Ambulatory Visit: Payer: Self-pay | Admitting: Cardiology

## 2019-05-08 ENCOUNTER — Other Ambulatory Visit: Payer: Self-pay | Admitting: Cardiology

## 2019-05-11 NOTE — Telephone Encounter (Signed)
Please advise, it states that the Telmesartan was changed to Olmesartan- please advise if this is correct, it is not on medication list, or recent messages.  Thank you!

## 2019-05-13 NOTE — Telephone Encounter (Signed)
I want to change his telmisartan prescription to 80 mg 1 tablet daily. .  No reason to change medications.  This was an attempt to allow her to have additional doses to take as needed, but clearly the insurance company is not willing to budge.  Glenetta Hew, MD

## 2019-05-13 NOTE — Telephone Encounter (Signed)
Telmisartan is on med list as 80 mg daily.  I refused the previous. Thank you!

## 2019-07-07 ENCOUNTER — Ambulatory Visit: Payer: Medicare Other | Admitting: Cardiology

## 2019-07-07 ENCOUNTER — Other Ambulatory Visit: Payer: Self-pay

## 2019-07-07 VITALS — BP 150/60 | HR 67 | Temp 97.7°F | Ht 74.0 in | Wt 215.0 lb

## 2019-07-07 DIAGNOSIS — I2510 Atherosclerotic heart disease of native coronary artery without angina pectoris: Secondary | ICD-10-CM

## 2019-07-07 DIAGNOSIS — I255 Ischemic cardiomyopathy: Secondary | ICD-10-CM | POA: Diagnosis not present

## 2019-07-07 DIAGNOSIS — E785 Hyperlipidemia, unspecified: Secondary | ICD-10-CM

## 2019-07-07 DIAGNOSIS — I6523 Occlusion and stenosis of bilateral carotid arteries: Secondary | ICD-10-CM

## 2019-07-07 DIAGNOSIS — E1169 Type 2 diabetes mellitus with other specified complication: Secondary | ICD-10-CM | POA: Diagnosis not present

## 2019-07-07 DIAGNOSIS — Z95828 Presence of other vascular implants and grafts: Secondary | ICD-10-CM

## 2019-07-07 DIAGNOSIS — I10 Essential (primary) hypertension: Secondary | ICD-10-CM

## 2019-07-07 NOTE — Patient Instructions (Signed)
Other Instructions MONITOR BLOOD PRESSURE  - THE RANGE SHOULD BE [id number] - [id number] SYSTOLIC   ( TOP NUMBER )   Medication Instructions:  NO CHANGES  *If you need a refill on your cardiac medications before your next appointment, please call your pharmacy*   Lab Work: NOT NEEDED If you have labs (blood work) drawn today and your tests are completely normal, you will receive your results only by: Marland Kitchen MyChart Message (if you have MyChart) OR . A paper copy in the mail If you have any lab test that is abnormal or we need to change your treatment, we will call you to review the results.   Testing/Procedures: NOT NEEDED   Follow-Up: At [facility], you and your health needs are our priority.  As part of our continuing mission to provide you with exceptional heart care, we have created designated Provider Care Teams.  These Care Teams include your primary Cardiologist (physician) and Advanced Practice Providers (APPs -  Physician Assistants and Nurse Practitioners) who all work together to provide you with the care you need, when you need it.  We recommend signing up for the patient portal called "MyChart".  Sign up information is provided on this After Visit Summary.  MyChart is used to connect with patients for Virtual Visits (Telemedicine).  Patients are able to view lab/test results, encounter notes, upcoming appointments, etc.  Non-urgent messages can be sent to your provider as well.   To learn more about what you can do with MyChart, go to [email or web address].    Your next appointment:   6 month(s)  The format for your next appointment:   In Person  Provider:   Glenetta Hew, MD

## 2019-07-07 NOTE — Progress Notes (Signed)
Primary Care Provider: Neale Burly, MD Cardiologist: Glenetta Hew, MD Electrophysiologist: None  Clinic Note: Chief Complaint  Patient presents with  . Follow-up    6 months.  . Coronary Artery Disease    Never really has any symptoms    HPI:    [name] is a 79 y.o. male with a cardiac history noted below who presents today for 67-month follow-up..    Initial cardiac evaluation was for preoperative risk stratification back in April 2012.-> Nuclear stress test was grossly abnormal and cardiac cath showed multivessel CAD he was referred for CABG.  2 years later - Abnormal stress test: Return the Cath Lab where he was found to have occluded SVG RCA as well as SVG-OM1-OM 2 --> PCI of native circumflex-OM1 and SVG-RI  Myoview 04/2016- read as HIGH RISK b/c reduced LVEF -- Echo confirmed relatively Normal EF. -- checked Echo. No ischemia - Med Rx.   2-D echo 03/21/2018: EF ~55-60% with GRD 1 DD.  Moderate LA dilation.  Moderate aortic thickening/sclerosis no stenosis.  [name] was last seen in November 2020.  He is feeling relatively well at that time.  Still not having chest pain or pressure with rest or exertion.  Was being frustrated by not going to the [facility].  Recent Hospitalizations: None  Reviewed  CV studies:    The following studies were reviewed today: (if available, images/films reviewed: From Epic Chart or Care Everywhere) . 03/23/2019 carotid dopplers -- CTA Neck : R ICA 139%, CCA <50%.  LICA lower end of 40 and 59%.  CCA not significant <50%.  Left vertebral artery antegrade flow, right vertebral artery demonstrates high resistance flow.  Normal bilateral subclavian's. o 04/10/2019 neck CTA: Both vertebral arteries occluded at their origins and reconstitutes in the neck by cervical collaterals.  Antegrade flow through the foramen magnum to the basilar artery atherosclerosis noted in both roughly 50%.  Right carotid bulb atherosclerotic disease roughly  40%.  Left ICA bulb 65%.  Aortic atherosclerosis.   Interval History:   [name] returns here today for routine follow-up stating that as usual he feels quite well.  He says usually his blood pressures are better controlled than they are today and is a little bit concerned.  He denies any headache or blurred vision.  No worsening dyspnea.  He never had symptoms of angina even prior to his CABG which is somewhat concerning.  He is pretty active however and denies any significant social dyspnea or chest pain/pressure.  No real PND, orthopnea or significant edema.  No real rapid irregular heartbeats or palpitations.  Nothing to suggest an arrhythmia.  No syncope or near syncope, TIA/amaurosis fugax.  No claudication  Is now try to get back into his exercise regimen, but has not yet gone back to the gym at the [facility]  He is doing treadmill exercises at home and walking as well as yard work.  The patient does not have symptoms concerning for COVID-19 infection (fever, chills, cough, or new shortness of breath).  The patient is practicing social distancing & Masking.   He and his wife both have the COVID-19 vaccine injections.  REVIEWED OF SYSTEMS   Review of Systems  Constitutional: Negative for malaise/fatigue and weight loss.  HENT: Negative for congestion and nosebleeds.   Respiratory: Positive for shortness of breath (Pretty much at baseline). Negative for cough.   Cardiovascular: Positive for leg swelling (Stable).  Gastrointestinal: Negative for blood in stool and melena.  Genitourinary: Negative for hematuria.  Musculoskeletal: Positive for joint pain. Negative for back pain and falls (None recently.).  Neurological: Negative for dizziness, focal weakness and weakness.  Psychiatric/Behavioral: Negative.      I have reviewed and (if needed) personally updated the patient's problem list, medications, allergies, past medical and surgical history, social and family history.    PAST MEDICAL HISTORY   Past Medical History:  Diagnosis Date  . 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 Ellyn Hack (Cardiologist): For Abnormal ST: CABG X 5; cath 03/2012 --> LIMA-LAD - patent, SVG-Ramus - anastamotic 80% - DES stent; SVG-RCA & SVG-OM1-OM2-- occluded; DES Stent to Cx-OM1  . Cardiomyopathy, ischemic- moderate LVD (EF of roughly 45%) at cath 2/143 11/03/2012  . Carotid artery narrowings 03/2018   Moderate L ICA 40-59%  . Carpal tunnel syndrome on right   . Chronic back pain    spinal stenosis and scoliosis  . DDD (degenerative disc disease), lumbosacral   . Diabetes type 2, controlled ([place])    takes Metformin once a wk  . 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 Dyslipidemia    takes Simvastatin daily  . First degree heart block   . GERD (gastroesophageal reflux disease)   . History of colon polyps    benign  . History of shingles    forehead --  dec 2016--  no residual pain  . Hypertension    takes Coreg,HCTZ,and Micardis daily  . Osteoarthritis   . Peripheral arterial disease [facility]) May 2016  (seen by dr berry)   L. ABI 0.58 with high grade L. SFA stenosis and occluded L. DP. R. ABI 0.48 with occluded R. SFA and occluded R. DP.  . Pneumonia    hx of-as a child  . Weakness    numbness and tingling    PAST SURGICAL HISTORY   Past Surgical History:  Procedure Laterality Date  . CARDIAC CATHETERIZATION  05/15/2010   abnormal stress test-- 3 vessel CAD/  LVEDP=80mmHg  (Dr. Debara Pickett for Dr,. Ellyn Hack)   . Carotid Dopplers  03/2018    Normal LV size and function.  EF 55 to 60%.  No R WMA.  Impaired relaxation (GR 1 DD).  Moderate LA dilation.  Moderate aortic thickening and calcification-sclerosis with no stenosis.  . CARPAL TUNNEL RELEASE Right 10/10/2015   Procedure: RIGHT HAND CARPAL TUNNEL RELEASE;  Surgeon: Iran Planas, MD;  Location: [place];  Service: Orthopedics;  Laterality: Right;  . COLONOSCOPY    . CORONARY ANGIOPLASTY      3 stents  . CORONARY ARTERY BYPASS GRAFT  05/22/2010  dr Lucianne Lei tright   x 5 (LIMA-LAD, SVG- RI,. SVG-OM1-OM2, SVG-dRCA)  . DECOMPRESSIVE LUMBAR LAMINECTOMY LEVEL 1 N/A 03/08/2016   Procedure: Lumbar 3-5 decompression with in situ fusion;  Surgeon: Melina Schools, MD;  Location: [place];  Service: Orthopedics;  Laterality: N/A;  . LEFT HEART CATHETERIZATION WITH CORONARY ANGIOGRAM N/A 04/02/2012   Procedure: LEFT HEART CATHETERIZATION WITH CORONARY ANGIOGRAM;  Surgeon: Leonie Man, MD;  Location[facility] CATH LAB: Occluded SVG-rPDA & native RCA, 100% SVG-OM. Severe anastomotic lesion in SVG-RI.  Patent LIMA-LAD. Severe mid Cx-OM  . NM MYOVIEW LTD  03/25/2012   Dr. Ellyn Hack: High Risk exercise nuclear study w/ scar in the apex and upper mid to basal inferior wall w/ signgicnat additional iscemia inferiorly to lateral-inferiorly from apex to base/  severe LV dysfunction w/ infero-apical dyskinesis and hypocontractility in the inferior and lateral wall, ef 33%  .  NM MYOVIEW LTD  04/2016   large-size, moderate intensity reversible perfusion defect in the inferior wall suggestive of ischemia. EF 34%. Global hypokinesis and severe inferior hypokinesis to akinesis. Also noted horizontal ST segment depression in inferior-lateralleads. HIGH RISK  . PERCUTANEOUS CORONARY STENT INTERVENTION (PCI-S) N/A 04/08/2012   Procedure: PERCUTANEOUS CORONARY STENT INTERVENTION (PCI-S);  Surgeon: Leonie Man, MD;  Location: [facility] CATH LAB: Promus Premier DES 2.25 mm x 12 mm - Mid Cx-OM1; Promus Premier DES 2.5 mm x 16 mm -- anastomotic SVG-RI (graft into native)  . TOTAL KNEE ARTHROPLASTY Right 10/26/2010  . TRANSTHORACIC ECHOCARDIOGRAM  08/21/2010   mild LVH, EF 45-50%, inferior hypokinesis,  grade 1 diastolic dysfunction/  moderate LAE/   mild MR/  trivial TR/  mild dilated RV  . TRANSTHORACIC ECHOCARDIOGRAM  03/2018    Normal LV size and function.  EF 55 to 60%.  No R WMA.  Impaired relaxation (GR 1 DD).  Moderate LA dilation.   Moderate aortic thickening and calcification-sclerosis with no stenosis.  Marland Kitchen UMBILICAL HERNIA REPAIR  1980's    MEDICATIONS/ALLERGIES   Current Meds  Medication Sig  . atorvastatin (LIPITOR) 20 MG tablet TAKE 1 TABLET BY MOUTH EVERY DAY  . carvedilol (COREG) 25 MG tablet TAKE 1 TABLET (25 MG TOTAL) BY MOUTH 2 (TWO) TIMES DAILY.  Marland Kitchen clopidogrel (PLAVIX) 75 MG tablet TAKE 1 TABLET BY MOUTH EVERY DAY  . fish oil-omega-3 fatty acids 1000 MG capsule Take 1 g by mouth daily.   . hydrochlorothiazide (HYDRODIURIL) 25 MG tablet Take 25 mg( 1 tablet)  by mouth daily ,except on Saturday and Sundays take 12.5 mg ( 1/2 tablet) a day  . metFORMIN (GLUCOPHAGE) 500 MG tablet Take 500 mg by mouth daily with breakfast.  . nitroGLYCERIN (NITROSTAT) 0.4 MG SL tablet Place 1 tablet (0.4 mg total) under the tongue every 5 (five) minutes as needed for chest pain.  Marland Kitchen telmisartan (MICARDIS) 80 MG tablet TAKE 2 TABLETS BY MOUTH EVERY DAY  . triamcinolone cream (KENALOG) 0.1 % EVERY DAY    Allergies  Allergen Reactions  . No Known Allergies     SOCIAL HISTORY/FAMILY HISTORY   Reviewed in Epic:  Pertinent findings: No new changes  OBJCTIVE -PE, EKG, labs   Wt Readings from Last 3 Encounters:  07/07/19 215 lb (97.5 kg)  04/20/19 216 lb (98 kg)  12/22/18 214 lb (97.1 kg)    Physical Exam: BP (!) 150/60 (BP Location: Left Arm, Patient Position: Sitting, Cuff Size: Normal)   Pulse 67   Temp 97.7 F (36.5 C)   Ht 6\' 2"  (1.88 m)   Wt 215 lb (97.5 kg)   BMI 27.60 kg/m  Physical Exam  Constitutional: He is oriented to person, place, and time. He appears well-developed and well-nourished. No distress.  HENT:  Head: Normocephalic and atraumatic.  Neck: No JVD (Minimal) present.  Pulmonary/Chest: Effort normal and breath sounds normal. No respiratory distress. He has no wheezes.  Distant breath sounds but normal.  Musculoskeletal:        General: No edema. Normal range of motion.     Cervical back:  Normal range of motion and neck supple.  Neurological: He is alert and oriented to person, place, and time.  Psychiatric: He has a normal mood and affect. His behavior is normal. Judgment and thought content normal.     Adult ECG Report  Rate: 67 ;  Rhythm: normal sinus rhythm and Cannot exclude LVH.  Also cannot exclude inferior MI, age-indeterminate.  Normal axis and intervals durations.;   Narrative Interpretation: Stable EKG  Recent Labs: Labs were just checked by PCP.  Last recorded labs I have are from September 2020-TC 155, TG 124, LDL 106 and HDL 37. No results found for: CHOL, HDL, LDLCALC, LDLDIRECT, TRIG, CHOLHDL Lab Results  Component Value Date   CREATININE 1.47 (H) 04/07/2019   BUN 25 (H) 04/07/2019   NA 131 (L) 04/07/2019   K 4.1 04/07/2019   CL 100 04/07/2019   CO2 22 04/07/2019   No results found for: TSH  ASSESSMENT/PLAN    Problem List Items Addressed This Visit    CAD, CABG X 18 May 2010, progression at cath 04/02/12 -- status post PCI to SVG-RI & native Cx-OM. - Primary (Chronic)    He never really did have anginal symptoms despite having significant CAD.  Inferior infarct noted on Myoview (and wall motion abnormality on echo) consistent with known disease with occluded RCA.  No need for relook catheter recently.  Plan: Continue maintenance clopidogrel for both CAD and carotid disease.--okay to hold for procedures 5 to 7 days preop.  Continue carvedilol and Micardis along with HCTZ and Lipitor.       Relevant Orders   EKG 12-Lead (Completed)   Presence of Saphenous Vein Bypass Graft stent -- anastomotic SVG-RI lesion, Promus Premier DES 2.5 mm x 16 mm (postdilated to 0.8 in graft) (Chronic)    Has pretty significant graft PCI--remains on long-term maintenance Plavix.  Okay to hold for procedures.      Essential hypertension (Chronic)    Blood pressure again is high today.  We will increase HCTZ 25 mg to go along with Micardis and Coreg.  At this point,  if pressures continue to be elevated, would probably need add calcium channel blocker.  Follow blood pressure recordings and discussed with PCP in follow-up.  Target SBP range 120-130 mmHg.      Dyslipidemia associated with type 2 diabetes mellitus (HCC) (Chronic)    Unfortunate I do not have the most recent labs.  Last labs are from September.  LDL is 106.  Not really at goal.  I think he probably needs to be on a stronger statin, but need to have labs to review.      Cardiomyopathy, ischemic-EF improved from 45% post CABG up to 55-60% (Chronic)   Relevant Orders   EKG 12-Lead (Completed)   Carotid artery narrowings (Chronic)    Pretty much stable disease noted on CTA as of February.  Continue to follow-up annually.  .  Aggressive cardiac risk factor modification.          COVID-19 Education: The signs and symptoms of COVID-19 were discussed with the patient and how to seek care for testing (follow up with PCP or arrange E-visit).   The importance of social distancing and COVID-19 vaccination was discussed today.  I spent a total of 35minutes with the patient. >  50% of the time was spent in direct patient consultation.  Additional time spent with chart review  / charting (studies, outside notes, etc): 8 Total Time: 30 min   Current medicines are reviewed at length with the patient today.  (+/- concerns) none  Notice: This dictation was prepared with Dragon dictation along with smaller phrase technology. Any transcriptional errors that result from this process are unintentional and may not be corrected upon review.  Patient Instructions / Medication Changes & Studies & Tests Ordered   Patient Instructions  Other Instructions MONITOR BLOOD PRESSURE  -  THE RANGE SHOULD BE [id number] - [id number] SYSTOLIC   ( TOP NUMBER )   Medication Instructions:  NO CHANGES  *If you need a refill on your cardiac medications before your next appointment, please call your pharmacy*   Lab Work: NOT  NEEDED If you have labs (blood work) drawn today and your tests are completely normal, you will receive your results only by: Marland Kitchen MyChart Message (if you have MyChart) OR . A paper copy in the mail If you have any lab test that is abnormal or we need to change your treatment, we will call you to review the results.   Testing/Procedures: NOT NEEDED   Follow-Up: At [facility], you and your health needs are our priority.  As part of our continuing mission to provide you with exceptional heart care, we have created designated Provider Care Teams.  These Care Teams include your primary Cardiologist (physician) and Advanced Practice Providers (APPs -  Physician Assistants and Nurse Practitioners) who all work together to provide you with the care you need, when you need it.  We recommend signing up for the patient portal called "MyChart".  Sign up information is provided on this After Visit Summary.  MyChart is used to connect with patients for Virtual Visits (Telemedicine).  Patients are able to view lab/test results, encounter notes, upcoming appointments, etc.  Non-urgent messages can be sent to your provider as well.   To learn more about what you can do with MyChart, go to [email or web address].    Your next appointment:   6 month(s)  The format for your next appointment:   In Person  Provider:   Glenetta Hew, MD        Studies Ordered:   Orders Placed This Encounter  Procedures  . EKG 12-Lead     Glenetta Hew, M.D., M.S. Interventional Cardiologist   Pager # [phone] Phone # [phone] [place], [place] [address]   Thank you for choosing Heartcare at [facility]!!

## 2019-07-13 ENCOUNTER — Encounter: Payer: Self-pay | Admitting: Cardiology

## 2019-07-13 NOTE — Assessment & Plan Note (Signed)
Has pretty significant graft PCI--remains on long-term maintenance Plavix.  Okay to hold for procedures.

## 2019-07-13 NOTE — Assessment & Plan Note (Signed)
He never really did have anginal symptoms despite having significant CAD.  Inferior infarct noted on Myoview (and wall motion abnormality on echo) consistent with known disease with occluded RCA.  No need for relook catheter recently.  Plan: Continue maintenance clopidogrel for both CAD and carotid disease.--okay to hold for procedures 5 to 7 days preop.  Continue carvedilol and Micardis along with HCTZ and Lipitor.

## 2019-07-13 NOTE — Assessment & Plan Note (Signed)
Blood pressure again is high today.  We will increase HCTZ 25 mg to go along with Micardis and Coreg.  At this point, if pressures continue to be elevated, would probably need add calcium channel blocker.  Follow blood pressure recordings and discussed with PCP in follow-up.  Target SBP range 120-130 mmHg.

## 2019-07-13 NOTE — Assessment & Plan Note (Signed)
Pretty much stable disease noted on CTA as of February.  Continue to follow-up annually.  .  Aggressive cardiac risk factor modification.

## 2019-07-13 NOTE — Assessment & Plan Note (Signed)
Unfortunate I do not have the most recent labs.  Last labs are from September.  LDL is 106.  Not really at goal.  I think he probably needs to be on a stronger statin, but need to have labs to review.

## 2019-12-18 ENCOUNTER — Other Ambulatory Visit: Payer: Self-pay

## 2019-12-18 DIAGNOSIS — I6523 Occlusion and stenosis of bilateral carotid arteries: Secondary | ICD-10-CM

## 2019-12-28 ENCOUNTER — Encounter: Payer: Self-pay | Admitting: Surgery

## 2019-12-28 ENCOUNTER — Other Ambulatory Visit: Payer: Self-pay

## 2019-12-28 ENCOUNTER — Ambulatory Visit (HOSPITAL_COMMUNITY)
Admission: RE | Admit: 2019-12-28 | Discharge: 2019-12-28 | Disposition: A | Discharge status: Home / Self Care | Payer: Medicare Other | Source: Ambulatory Visit | Attending: Surgery | Admitting: Surgery

## 2019-12-28 ENCOUNTER — Ambulatory Visit (INDEPENDENT_AMBULATORY_CARE_PROVIDER_SITE_OTHER): Payer: Medicare Other | Admitting: Surgery

## 2019-12-28 VITALS — BP 197/80 | HR 63 | Temp 97.2°F | Resp 20 | Ht 74.0 in | Wt 214.0 lb

## 2019-12-28 DIAGNOSIS — I70213 Atherosclerosis of native arteries of extremities with intermittent claudication, bilateral legs: Secondary | ICD-10-CM

## 2019-12-28 DIAGNOSIS — I6523 Occlusion and stenosis of bilateral carotid arteries: Secondary | ICD-10-CM

## 2019-12-28 NOTE — Progress Notes (Signed)
Vascular and Vein Specialist of [facility]  Patient name: [name] MRN: [id number] DOB: [birth year] Sex: male   REASON FOR VISIT:    Follow up  HISOTRY OF PRESENT ILLNESS:    [name] is a 79 y.o. male that I saw in March 2021 at the request of Dr. Ellyn Hack for evaluation of carotid and vertebral stenosis.  The patient has a history of CABG as well as PCI.  He had a syncopal episode in 2020 which led to a work-up which included carotid Dopplers that showed bilateral vertebral artery occlusion which was confirmed with CT scan.  The patient denied episodes of dizziness or loss of balance.  He denied focal symptoms of numbness or weakness in his extremities.  He also denied amaurosis fugax.  Imaging studies indicated a 40% right carotid stenosis and a 65% left carotid stenosis.  No intervention is recommended.  He is back for follow-up.  He has not had any new episodes since the last time I saw him.  The patient is medically managed for hypertension and diabetes.  He takes a statin for hypercholesterolemia.  He is a former smoker   PAST MEDICAL HISTORY:   Past Medical History:  Diagnosis Date  . 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 Ellyn Hack (Cardiologist): For Abnormal ST: CABG X 5; cath 03/2012 --> LIMA-LAD - patent, SVG-Ramus - anastamotic 80% - DES stent; SVG-RCA & SVG-OM1-OM2-- occluded; DES Stent to Cx-OM1  . Cardiomyopathy, ischemic- moderate LVD (EF of roughly 45%) at cath 2/143 11/03/2012  . Carotid artery narrowings 03/2018   Moderate L ICA 40-59%  . Carpal tunnel syndrome on right   . Chronic back pain    spinal stenosis and scoliosis  . DDD (degenerative disc disease), lumbosacral   . Diabetes type 2, controlled ([place])    takes Metformin once a wk  . 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 Dyslipidemia    takes Simvastatin daily  . First degree heart block   . GERD (gastroesophageal reflux disease)   .  History of colon polyps    benign  . History of shingles    forehead --  dec 2016--  no residual pain  . Hypertension    takes Coreg,HCTZ,and Micardis daily  . Osteoarthritis   . Peripheral arterial disease [facility]) May 2016  (seen by dr berry)   L. ABI 0.58 with high grade L. SFA stenosis and occluded L. DP. R. ABI 0.48 with occluded R. SFA and occluded R. DP.  . Pneumonia    hx of-as a child  . Weakness    numbness and tingling     FAMILY HISTORY:   Family History  Problem Relation Age of Onset  . Cancer Mother     SOCIAL HISTORY:   Social History   Tobacco Use  . Smoking status: Former Smoker    Types: Cigarettes    Quit date: 09/26/1980    Years since quitting: 39.2  . Smokeless tobacco: Never Used  Substance Use Topics  . Alcohol use: No     ALLERGIES:   Allergies  Allergen Reactions  . No Known Allergies      CURRENT MEDICATIONS:   Current Outpatient Medications  Medication Sig Dispense Refill  . atorvastatin (LIPITOR) 20 MG tablet TAKE 1 TABLET BY MOUTH EVERY DAY 90 tablet 3  . carvedilol (COREG) 25 MG tablet TAKE 1 TABLET (25 MG TOTAL) BY MOUTH 2 (TWO) TIMES DAILY. 180 tablet 1  .  clopidogrel (PLAVIX) 75 MG tablet TAKE 1 TABLET BY MOUTH EVERY DAY 90 tablet 3  . fish oil-omega-3 fatty acids 1000 MG capsule Take 1 g by mouth daily.     . hydrochlorothiazide (HYDRODIURIL) 25 MG tablet Take 25 mg( 1 tablet)  by mouth daily ,except on Saturday and Sundays take 12.5 mg ( 1/2 tablet) a day 90 tablet 3  . metFORMIN (GLUCOPHAGE) 500 MG tablet Take 500 mg by mouth daily with breakfast.    . nitroGLYCERIN (NITROSTAT) 0.4 MG SL tablet Place 1 tablet (0.4 mg total) under the tongue every 5 (five) minutes as needed for chest pain. 25 tablet 2  . telmisartan (MICARDIS) 80 MG tablet TAKE 2 TABLETS BY MOUTH EVERY DAY 180 tablet 2  . triamcinolone cream (KENALOG) 0.1 % EVERY DAY     No current facility-administered medications for this visit.    REVIEW OF SYSTEMS:    [X]  denotes positive finding, [ ]  denotes negative finding Cardiac  Comments:  Chest pain or chest pressure:    Shortness of breath upon exertion:    Short of breath when lying flat:    Irregular heart rhythm:        Vascular    Pain in calf, thigh, or hip brought on by ambulation:    Pain in feet at night that wakes you up from your sleep:     Blood clot in your veins:    Leg swelling:         Pulmonary    Oxygen at home:    Productive cough:     Wheezing:         Neurologic    Sudden weakness in arms or legs:     Sudden numbness in arms or legs:     Sudden onset of difficulty speaking or slurred speech:    Temporary loss of vision in one eye:     Problems with dizziness:         Gastrointestinal    Blood in stool:     Vomited blood:         Genitourinary    Burning when urinating:     Blood in urine:        Psychiatric    Major depression:         Hematologic    Bleeding problems:    Problems with blood clotting too easily:        Skin    Rashes or ulcers:        Constitutional    Fever or chills:      PHYSICAL EXAM:   Vitals:   12/28/19 1400 12/28/19 1402  BP: (!) 195/72 (!) 197/80  Pulse: 63   Resp: 20   Temp: (!) 97.2 F (36.2 C)   SpO2: 97%   Weight: 214 lb (97.1 kg)   Height: 6\' 2"  (1.88 m)     GENERAL: The patient is a well-nourished male, in no acute distress. The vital signs are documented above. CARDIAC: There is a regular rate and rhythm.  PULMONARY: Non-labored respirations MUSCULOSKELETAL: There are no major deformities or cyanosis. NEUROLOGIC: No focal weakness or paresthesias are detected. SKIN: There are no ulcers or rashes noted. PSYCHIATRIC: The patient has a normal affect.  STUDIES:   I have reviewed the following images: Right Carotid: Velocities in the right ICA are consistent with a 1-39%  stenosis.   Left Carotid: Velocities in the left ICA are consistent with a 40-59%  stenosis.  The ECA appears  >50% stenosed.   Vertebrals: Bilateral vertebral arteries demonstrate antegrade flow.  Possible        dissection in the left vertebral.  Subclavians: Normal flow hemodynamics were seen in bilateral subclavian        arteries.   MEDICAL ISSUES:   Carotid stenosis: The patient remains asymptomatic.  He will be scheduled for follow-up in 1 year with repeat duplex    Annamarie Major, IV, MD, FACS Vascular and Vein Specialists of [facility] [phone] Pager [phone]

## 2020-01-11 ENCOUNTER — Other Ambulatory Visit: Payer: Self-pay

## 2020-01-11 ENCOUNTER — Ambulatory Visit (INDEPENDENT_AMBULATORY_CARE_PROVIDER_SITE_OTHER): Payer: Medicare Other | Admitting: Cardiology

## 2020-01-11 ENCOUNTER — Encounter: Payer: Self-pay | Admitting: Cardiology

## 2020-01-11 VITALS — BP 161/76 | HR 68 | Temp 97.3°F | Ht 74.0 in | Wt 215.6 lb

## 2020-01-11 DIAGNOSIS — I255 Ischemic cardiomyopathy: Secondary | ICD-10-CM

## 2020-01-11 DIAGNOSIS — I6529 Occlusion and stenosis of unspecified carotid artery: Secondary | ICD-10-CM

## 2020-01-11 DIAGNOSIS — I2510 Atherosclerotic heart disease of native coronary artery without angina pectoris: Secondary | ICD-10-CM | POA: Diagnosis not present

## 2020-01-11 DIAGNOSIS — I10 Essential (primary) hypertension: Secondary | ICD-10-CM

## 2020-01-11 DIAGNOSIS — E1169 Type 2 diabetes mellitus with other specified complication: Secondary | ICD-10-CM

## 2020-01-11 DIAGNOSIS — E785 Hyperlipidemia, unspecified: Secondary | ICD-10-CM

## 2020-01-11 DIAGNOSIS — Z95828 Presence of other vascular implants and grafts: Secondary | ICD-10-CM | POA: Diagnosis not present

## 2020-01-11 DIAGNOSIS — I739 Peripheral vascular disease, unspecified: Secondary | ICD-10-CM

## 2020-01-11 DIAGNOSIS — Z9861 Coronary angioplasty status: Secondary | ICD-10-CM

## 2020-01-11 DIAGNOSIS — I25708 Atherosclerosis of coronary artery bypass graft(s), unspecified, with other forms of angina pectoris: Secondary | ICD-10-CM

## 2020-01-11 DIAGNOSIS — R9439 Abnormal result of other cardiovascular function study: Secondary | ICD-10-CM

## 2020-01-11 MED ORDER — AMLODIPINE BESYLATE 5 MG PO TABS: 5.0000 mg | ORAL_TABLET | Freq: Every day | ORAL | 3 refills | Status: DC

## 2020-01-11 NOTE — Progress Notes (Signed)
Primary Care Provider: Neale Burly, MD Cardiologist: Glenetta Hew, MD Electrophysiologist: None  Clinic Note: Chief Complaint  Patient presents with  . Follow-up    42-month  . Coronary Artery Disease    No angina  . Hypertension    Blood pressure not yet at goal    HPI:    [name] is a 79 y.o. male with a PMH of CAD, PAD & Mild ICM with notable CRFs reviewed below who presents today for 6 month f/u.    Initial cardiac evaluation was for preoperative risk stratification back in April 2012.-> Nuclear stress test was grossly abnormal and cardiac cath showed multivessel CAD he was referred for CABG.  2 years later - Abnormal stress test: Return the Cath Lab where he was found to have occluded SVG RCA as well as SVG-OM1-OM 2 --> PCI of native circumflex-OM1 and SVG-RI  Myoview 04/2016- read as HIGH RISK b/c reduced LVEF -- Echo confirmed relatively Normal EF. -- checked Echo. No ischemia - Med Rx.  2-D echo 03/21/2018: EF~55-60%with GRD 1DD. Moderate LA dilation. Moderate aortic thickening/sclerosis no stenosis.  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 Status post PCI to SVG-RI (Chronic)   Resistant hypertension (Chronic)   Dyslipidemia associated with type 2 diabetes mellitus (HCC) (Chronic)   Cardiomyopathy, ischemic-EF improved from 45% post CABG up to 55-60% - Primary (Chronic)   Abnormal nuclear stress test (Chronic)      [name] was last seen on Jul 07, 2019 for routine follow-up.  Doing well.  Usually blood pressure is better controlled than they had been.  We will try to get back into his exercise regimen.  Has not yet gone back to the gym/[employer].  Again baseline dyspnea.   Stable joint pain that limits exercise.  As usual, no SSx of Angina or CHF (he never did have angina - even before his CABG).  Plan was to monitor home BP.   Recent Hospitalizations: None  Reviewed  CV studies:    The following studies were reviewed today: (if available, images/films reviewed: From Epic Chart or Care Everywhere) . none:  Interval History:   [name] presents today (actually unexpectedly - incorrectly thinking that he was scheduled for today) doing pretty well overall.   Still not comfortable going back to the Gym - his children got together to purchase a treadmill machine that he is using with good regularity - at least 30 min most days.  He continues to deny any SSx of Angina or exertional dyspnea.    He tells me that his SBP levels @ home range from 140s-150s, but denis any Headaches or dizziness.    CV Review of Symptoms (Summary): no chest pain or dyspnea on exertion negative for - edema, irregular heartbeat, orthopnea, palpitations, paroxysmal nocturnal dyspnea, rapid heart rate, shortness of breath or syncope/ near syncope, TIA/ amaurosis fugax, or claudication  The patient does not have symptoms concerning for COVID-19 infection (fever, chills, cough, or new shortness of breath).   REVIEWED OF SYSTEMS   ROS   I have reviewed and (if needed) personally updated the patient's problem list, medications, allergies, past medical and surgical history, social and  family history.   PAST MEDICAL HISTORY   Past Medical History:  Diagnosis Date  . 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 Ellyn Hack (Cardiologist): For Abnormal ST: CABG X 5; cath 03/2012 --> LIMA-LAD - patent, SVG-Ramus - anastamotic 80% - DES stent; SVG-RCA & SVG-OM1-OM2-- occluded; DES Stent to Cx-OM1  . Cardiomyopathy, ischemic- moderate LVD (EF of roughly 45%) at cath 2/143 11/03/2012   Resolved - f/u Echo with EF 55-60%  . Carotid artery narrowings 03/2018   Moderate L ICA 40-59%  . Carpal  tunnel syndrome on right   . Chronic back pain    spinal stenosis and scoliosis  . DDD (degenerative disc disease), lumbosacral   . Diabetes type 2, controlled ([place])    takes Metformin once a wk  . 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 Dyslipidemia    takes Simvastatin daily  . First degree heart block   . GERD (gastroesophageal reflux disease)   . History of colon polyps    benign  . History of shingles    forehead --  dec 2016--  no residual pain  . Osteoarthritis   . Peripheral arterial disease [facility]) May 2016  (seen by dr berry)   L. ABI 0.58 with high grade L. SFA stenosis and occluded L. DP. R. ABI 0.48 with occluded R. SFA and occluded R. DP.  . Pneumonia    hx of-as a child  . Resistant hypertension    takes Coreg,HCTZ,and Micardis daily  . Weakness    numbness and tingling    PAST SURGICAL HISTORY   Past Surgical History:  Procedure Laterality Date  . Carotid Dopplers  03/2018    Normal LV size and function.  EF 55 to 60%.  No R WMA.  Impaired relaxation (GR 1 DD).  Moderate LA dilation.  Moderate aortic thickening and calcification-sclerosis with no stenosis.  . CARPAL TUNNEL RELEASE Right 10/10/2015   Procedure: RIGHT HAND CARPAL TUNNEL RELEASE;  Surgeon: Iran Planas, MD;  Location: [place];  Service: Orthopedics;  Laterality: Right;  . COLONOSCOPY    . CORONARY ANGIOPLASTY     3 stents  . CORONARY ARTERY BYPASS GRAFT  05/22/2010  dr Lucianne Lei tright   x 5 (LIMA-LAD, SVG- RI,. SVG-OM1-OM2, SVG-dRCA)  . DECOMPRESSIVE LUMBAR LAMINECTOMY LEVEL 1 N/A 03/08/2016   Procedure: Lumbar 3-5 decompression with in situ fusion;  Surgeon: Melina Schools, MD;  Location: [facility];  Service: Orthopedics;  Laterality: N/A;  . LEFT HEART CATH AND CORONARY ANGIOGRAPHY  05/15/2010   abnormal stress test-- 3 vessel CAD/  LVEDP=95mmHg  (Dr. Debara Pickett for Dr,. Ellyn Hack)   . LEFT HEART CATHETERIZATION WITH CORONARY ANGIOGRAM N/A 04/02/2012   Procedure: LEFT HEART  CATHETERIZATION WITH CORONARY ANGIOGRAM;  Surgeon: Leonie Man, MD;  Location: [facility] CATH LAB: Occluded SVG-rPDA & native RCA, 100% SVG-OM. Severe anastomotic lesion in SVG-RI.  Patent LIMA-LAD. Severe mid Cx-OM  . NM MYOVIEW LTD  03/25/2012   Dr. Ellyn Hack: High Risk exercise nuclear study w/ scar in the apex and upper mid to basal inferior wall w/ signgicnat additional iscemia inferiorly to lateral-inferiorly from apex to base/  severe LV dysfunction w/ infero-apical dyskinesis and hypocontractility in the inferior and lateral wall, ef 33%  . NM MYOVIEW LTD  04/2016   large-size, moderate intensity reversible perfusion defect in the inferior wall suggestive of ischemia. EF 34%. Global hypokinesis and severe inferior hypokinesis to akinesis. Also noted horizontal ST segment depression in inferior-lateralleads. HIGH RISK  .  PERCUTANEOUS CORONARY STENT INTERVENTION (PCI-S) N/A 04/08/2012   Procedure: PERCUTANEOUS CORONARY STENT INTERVENTION (PCI-S);  Surgeon: Leonie Man, MD;  Location: [facility] CATH LAB: Promus Premier DES 2.25 mm x 12 mm - Mid Cx-OM1; Promus Premier DES 2.5 mm x 16 mm -- anastomotic SVG-RI (graft into native)  . TOTAL KNEE ARTHROPLASTY Right 10/26/2010  . TRANSTHORACIC ECHOCARDIOGRAM  08/21/2010   mild LVH, EF 45-50%, inferior hypokinesis,  grade 1 diastolic dysfunction/  moderate LAE/   mild MR/  trivial TR/  mild dilated RV  . TRANSTHORACIC ECHOCARDIOGRAM  03/2018    Normal LV size and function.  EF 55 to 60%.  No R WMA.  Impaired relaxation (GR 1 DD).  Moderate LA dilation.  Moderate aortic thickening and calcification-sclerosis with no stenosis.  Marland Kitchen UMBILICAL HERNIA REPAIR  1980's    There is no immunization history on file for this patient.  MEDICATIONS/ALLERGIES   Current Meds  Medication Sig  . atorvastatin (LIPITOR) 20 MG tablet TAKE 1 TABLET BY MOUTH EVERY DAY  . carvedilol (COREG) 25 MG tablet TAKE 1 TABLET (25 MG TOTAL) BY MOUTH 2 (TWO) TIMES DAILY.  Marland Kitchen clopidogrel  (PLAVIX) 75 MG tablet TAKE 1 TABLET BY MOUTH EVERY DAY  . fish oil-omega-3 fatty acids 1000 MG capsule Take 1 g by mouth daily.   . hydrochlorothiazide (HYDRODIURIL) 25 MG tablet Take 25 mg( 1 tablet)  by mouth daily ,except on Saturday and Sundays take 12.5 mg ( 1/2 tablet) a day  . metFORMIN (GLUCOPHAGE) 500 MG tablet Take 500 mg by mouth daily with breakfast.  . nitroGLYCERIN (NITROSTAT) 0.4 MG SL tablet Place 1 tablet (0.4 mg total) under the tongue every 5 (five) minutes as needed for chest pain.  Marland Kitchen telmisartan (MICARDIS) 80 MG tablet TAKE 2 TABLETS BY MOUTH EVERY DAY  . triamcinolone cream (KENALOG) 0.1 % EVERY DAY    Allergies  Allergen Reactions  . No Known Allergies     SOCIAL HISTORY/FAMILY HISTORY   Reviewed in Epic:  Pertinent findings:  - Now using home TM machine almost every day  OBJCTIVE -PE, EKG, labs   Wt Readings from Last 3 Encounters:  01/11/20 215 lb 9.6 oz (97.8 kg)  12/28/19 214 lb (97.1 kg)  07/07/19 215 lb (97.5 kg)    Physical Exam: BP (!) 161/76   Pulse 68   Temp (!) 97.3 F (36.3 C)   Ht 6\' 2"  (1.88 m)   Wt 215 lb 9.6 oz (97.8 kg)   SpO2 (!) 9%   BMI 27.68 kg/m  Physical Exam Constitutional:      General: He is not in acute distress.    Appearance: Normal appearance. He is normal weight. He is not ill-appearing or toxic-appearing.     Comments: Well groomed.   HENT:     Head: Normocephalic and atraumatic.  Neck:     Vascular: No carotid bruit.  Cardiovascular:     Rate and Rhythm: Normal rate and regular rhythm.     Pulses: Normal pulses.     Heart sounds: Normal heart sounds.  Pulmonary:     Effort: Pulmonary effort is normal. No respiratory distress.     Breath sounds: Normal breath sounds.  Musculoskeletal:        General: No swelling. Normal range of motion.     Cervical back: Normal range of motion.  Neurological:     General: No focal deficit present.     Mental Status: He is alert and oriented to person, place, and  time.      Gait: Gait normal.  Psychiatric:        Mood and Affect: Mood normal.        Behavior: Behavior normal.        Thought Content: Thought content normal.        Judgment: Judgment normal.     Adult ECG Report  n/a  Recent Labs:    04/23/2019:  TC 167, TG 88, HDL 44, LDL-c ? (not reported); A1c 6.3, Hgb 12.5; Cr 1.12, K+ 4.1 --> Due for labs from PCP in ~2 weeks.  No results found for: CHOL, HDL, LDLCALC, LDLDIRECT, TRIG, CHOLHDL Lab Results  Component Value Date   CREATININE 1.47 (H) 04/07/2019   BUN 25 (H) 04/07/2019   NA 131 (L) 04/07/2019   K 4.1 04/07/2019   CL 100 04/07/2019   CO2 22 04/07/2019   No results found for: TSH  ASSESSMENT/PLAN    Problem List Items Addressed This Visit    CAD, CABG X 18 May 2010, progression at cath 04/02/12 -- status post PCI to SVG-RI & native Cx-OM. (Chronic)    Never had active anginal symptoms.  Remains symptom-free with no anginal heart failure. Documented evidence of likely inferior infarct on both Myoview (in March 2018) and echo with confirmed occluded RCA.   He will be due for follow-up stress test next year.  We talked about it briefly again he indicated that he would like to think about whether or not he wants to do another test, or just hold off.   His lack of symptoms despite having progression of disease is a bit concerning.   Plan: Hold off on relook Myoview & discuss next visit  On atorvastatin and omega-3 fatty acids, will wait for results of upcoming labs to determine if changes need to be made  Continue beta-blocker and ARB along with maintenance dose Plavix  Okay to hold Plavix 5 days preop for any surgeries or procedures.      Relevant Medications   amLODipine (NORVASC) 5 MG tablet   Presence of Saphenous Vein Bypass Graft stent -- anastomotic SVG-RI lesion, Promus Premier DES 2.5 mm x 16 mm (postdilated to 0.8 in graft) (Chronic)    Known TO of SVG-RCA & SeqSVG-OM1-2 & DES PCI of 80% anastomosis lesion in  SVG-RI.  --> PCI native LCx (requiring scoring PTCA) & PCI of SVG-RI-nRI anastomotic PCI. Marland Kitchen   Remains on maintenance Plavix/clopidogrel   Okay to hold clopidogrel 5 days preop for surgeries or procedures.      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 Status post PCI to SVG-RI - Primary (Chronic)    Remains active with routine exercise and had no symptoms of angina.  On stable medical regimen.  Plan to rediscuss potential Myoview next follow-up.  Continue medications for now.      Relevant Medications   amLODipine (NORVASC) 5 MG tablet   Resistant hypertension (Chronic)    He is now on HCTZ, telmisartan and carvedilol at all very high doses. BP still not yet under adequate control.  Plan: We will add amlodipine 5 mg daily.      Relevant Medications   amLODipine (NORVASC) 5 MG tablet   Dyslipidemia associated with type 2 diabetes mellitus ([place]) (Chronic)    He is due to have labs rechecked soon.  Plan was to potentially address lipids based on these  results.  Unfortunately not yet drawn.  He is having labs checked in the next month or so by his PCP.  Will ask for consult. I spent a participated with Tikosyn uptitrate his lipid medications currently 120 atorvastatin.  I would probably consider either increasing to 40 mg for converting to rosuvastatin (starting at 40 mg if necessary)      Cardiomyopathy, ischemic-EF improved from 45% post CABG up to 55-60% (Chronic)    Essentially resolved now.  Just diastolic dysfunction.  Is on max dose carvedilol and ARB along with HCTZ. Adding amlodipine for additional blood pressure control.  Otherwise euvolemic.  No diuretic besides HCTZ required.      Relevant Medications   amLODipine (NORVASC) 5 MG tablet   Peripheral arterial disease (HCC) (Chronic)    Mild carotid disease only.  No claudication.      Relevant  Medications   amLODipine (NORVASC) 5 MG tablet   Abnormal nuclear stress test (Chronic)    He has significant CAD, including PCI to vein graft.  Unfortunately, he had an abnormal Myoview that was grossly abnormal but not much to be done for revascularization standpoint.  As such, we remain reluctant to continue to check surveillance 4-5 years for fear of of this leading to unnecessary heart catheterization procedures.  For now we will simply monitor for symptoms.      Carotid artery narrowings (Chronic)    Will be due for follow-up carotid Dopplers next year.      Relevant Medications   amLODipine (NORVASC) 5 MG tablet     COVID-19 Education: The signs and symptoms of COVID-19 were discussed with the patient and how to seek care for testing (follow up with PCP or arrange E-visit).   The importance of social distancing and COVID-19 vaccination was discussed today. The patient is practicing social distancing & Masking.   I spent a total of 59minutes with the patient spent in direct patient consultation.  Additional time spent with chart review  / charting (studies, outside notes, etc): 14 Total Time: 42 min  Current medicines are reviewed at length with the patient today.  (+/- concerns) n/a  This visit occurred during the SARS-CoV-2 public health emergency.  Safety protocols were in place, including screening questions prior to the visit, additional usage of staff PPE, and extensive cleaning of exam room while observing appropriate contact time as indicated for disinfecting solutions.  Notice: This dictation was prepared with Dragon dictation along with smaller phrase technology. Any transcriptional errors that result from this process are unintentional and may not be corrected upon review.  Patient Instructions / Medication Changes & Studies & Tests Ordered   Patient Instructions  Medication Instructions:  Start Amlodipine 5 mg  One tablet daily.  *If you need a refill on your  cardiac medications before your next appointment, please call your pharmacy*   Lab Work: Not needed    Testing/Procedures: Not needed   Follow-Up: At [facility], you and your health needs are our priority.  As part of our continuing mission to provide you with exceptional heart care, we have created designated Provider Care Teams.  These Care Teams include your primary Cardiologist (physician) and Advanced Practice Providers (APPs -  Physician Assistants and Nurse Practitioners) who all work together to provide you with the care you need, when you need it.  We recommend signing up for the patient portal called "MyChart".  Sign up information is provided on this After Visit Summary.  MyChart is used to connect  with patients for Virtual Visits (Telemedicine).  Patients are able to view lab/test results, encounter notes, upcoming appointments, etc.  Non-urgent messages can be sent to your provider as well.   To learn more about what you can do with MyChart, go to [email or web address].    Your next appointment:   6 month(s)  The format for your next appointment:   In Person  Provider:   Glenetta Hew, MD   Other Instructions     Studies Ordered:   No orders of the defined types were placed in this encounter.    Glenetta Hew, M.D., M.S. Interventional Cardiologist   Pager # [phone] Phone # [phone] [address] [place], [place] [address]   Thank you for choosing Heartcare at [facility]!!

## 2020-01-11 NOTE — Assessment & Plan Note (Addendum)
He has significant CAD, including PCI to vein graft.  Unfortunately, he had an abnormal Myoview that was grossly abnormal but not much to be done for revascularization standpoint.  As such, we remain reluctant to continue to check surveillance 4-5 years for fear of of this leading to unnecessary heart catheterization procedures.  For now we will simply monitor for symptoms.

## 2020-01-11 NOTE — Patient Instructions (Signed)
Medication Instructions:  Start Amlodipine 5 mg  One tablet daily.  *If you need a refill on your cardiac medications before your next appointment, please call your pharmacy*   Lab Work: Not needed    Testing/Procedures: Not needed   Follow-Up: At [facility], you and your health needs are our priority.  As part of our continuing mission to provide you with exceptional heart care, we have created designated Provider Care Teams.  These Care Teams include your primary Cardiologist (physician) and Advanced Practice Providers (APPs -  Physician Assistants and Nurse Practitioners) who all work together to provide you with the care you need, when you need it.  We recommend signing up for the patient portal called "MyChart".  Sign up information is provided on this After Visit Summary.  MyChart is used to connect with patients for Virtual Visits (Telemedicine).  Patients are able to view lab/test results, encounter notes, upcoming appointments, etc.  Non-urgent messages can be sent to your provider as well.   To learn more about what you can do with MyChart, go to [email or web address].    Your next appointment:   6 month(s)  The format for your next appointment:   In Person  Provider:   Glenetta Hew, MD   Other Instructions

## 2020-01-16 ENCOUNTER — Encounter: Payer: Self-pay | Admitting: Cardiology

## 2020-01-16 NOTE — Assessment & Plan Note (Signed)
Essentially resolved now.  Just diastolic dysfunction.  Is on max dose carvedilol and ARB along with HCTZ. Adding amlodipine for additional blood pressure control.  Otherwise euvolemic.  No diuretic besides HCTZ required.

## 2020-01-16 NOTE — Assessment & Plan Note (Signed)
Remains active with routine exercise and had no symptoms of angina.  On stable medical regimen.  Plan to rediscuss potential Myoview next follow-up.  Continue medications for now.

## 2020-01-16 NOTE — Assessment & Plan Note (Signed)
He is due to have labs rechecked soon.  Plan was to potentially address lipids based on these results.  Unfortunately not yet drawn.  He is having labs checked in the next month or so by his PCP.  Will ask for consult. I spent a participated with Tikosyn uptitrate his lipid medications currently 120 atorvastatin.  I would probably consider either increasing to 40 mg for converting to rosuvastatin (starting at 40 mg if necessary)

## 2020-01-16 NOTE — Assessment & Plan Note (Signed)
Mild carotid disease only.  No claudication.

## 2020-01-16 NOTE — Assessment & Plan Note (Signed)
Will be due for follow-up carotid Dopplers next year.

## 2020-01-16 NOTE — Assessment & Plan Note (Addendum)
Never had active anginal symptoms.  Remains symptom-free with no anginal heart failure. Documented evidence of likely inferior infarct on both Myoview (in March 2018) and echo with confirmed occluded RCA.   He will be due for follow-up stress test next year.  We talked about it briefly again he indicated that he would like to think about whether or not he wants to do another test, or just hold off.   His lack of symptoms despite having progression of disease is a bit concerning.   Plan: Hold off on relook Myoview & discuss next visit  On atorvastatin and omega-3 fatty acids, will wait for results of upcoming labs to determine if changes need to be made  Continue beta-blocker and ARB along with maintenance dose Plavix  Okay to hold Plavix 5 days preop for any surgeries or procedures.

## 2020-01-16 NOTE — Assessment & Plan Note (Signed)
He is now on HCTZ, telmisartan and carvedilol at all very high doses. BP still not yet under adequate control.  Plan: We will add amlodipine 5 mg daily.

## 2020-01-16 NOTE — Assessment & Plan Note (Signed)
Known TO of SVG-RCA & SeqSVG-OM1-2 & DES PCI of 80% anastomosis lesion in SVG-RI.  --> PCI native LCx (requiring scoring PTCA) & PCI of SVG-RI-nRI anastomotic PCI. Marland Kitchen   Remains on maintenance Plavix/clopidogrel   Okay to hold clopidogrel 5 days preop for surgeries or procedures.

## 2020-02-08 ENCOUNTER — Other Ambulatory Visit: Payer: Self-pay | Admitting: Cardiology

## 2020-03-23 ENCOUNTER — Ambulatory Visit (HOSPITAL_COMMUNITY)
Admission: RE | Admit: 2020-03-23 | Discharge: 2020-03-23 | Disposition: A | Discharge status: Home / Self Care | Payer: Medicare Other | Source: Ambulatory Visit | Attending: Cardiovascular Disease | Admitting: Cardiovascular Disease

## 2020-03-23 ENCOUNTER — Other Ambulatory Visit: Payer: Self-pay

## 2020-03-23 DIAGNOSIS — I6523 Occlusion and stenosis of bilateral carotid arteries: Secondary | ICD-10-CM

## 2020-04-06 ENCOUNTER — Other Ambulatory Visit: Payer: Self-pay | Admitting: Cardiology

## 2020-04-21 ENCOUNTER — Other Ambulatory Visit: Payer: Self-pay | Admitting: Cardiology

## 2020-05-27 ENCOUNTER — Other Ambulatory Visit: Payer: Self-pay | Admitting: Cardiology

## 2020-06-30 ENCOUNTER — Ambulatory Visit: Payer: Medicare Other | Admitting: Cardiology

## 2020-07-12 ENCOUNTER — Other Ambulatory Visit: Payer: Self-pay

## 2020-07-12 ENCOUNTER — Ambulatory Visit: Payer: Medicare Other | Admitting: Cardiology

## 2020-07-12 ENCOUNTER — Encounter: Payer: Self-pay | Admitting: Cardiology

## 2020-07-12 VITALS — BP 150/84 | HR 63 | Ht 74.0 in | Wt 218.6 lb

## 2020-07-12 DIAGNOSIS — I1A0 Resistant hypertension: Secondary | ICD-10-CM

## 2020-07-12 DIAGNOSIS — I2510 Atherosclerotic heart disease of native coronary artery without angina pectoris: Secondary | ICD-10-CM | POA: Diagnosis not present

## 2020-07-12 DIAGNOSIS — I255 Ischemic cardiomyopathy: Secondary | ICD-10-CM | POA: Diagnosis not present

## 2020-07-12 DIAGNOSIS — E1169 Type 2 diabetes mellitus with other specified complication: Secondary | ICD-10-CM

## 2020-07-12 DIAGNOSIS — I25810 Atherosclerosis of coronary artery bypass graft(s) without angina pectoris: Secondary | ICD-10-CM

## 2020-07-12 DIAGNOSIS — E785 Hyperlipidemia, unspecified: Secondary | ICD-10-CM

## 2020-07-12 DIAGNOSIS — I10 Essential (primary) hypertension: Secondary | ICD-10-CM | POA: Diagnosis not present

## 2020-07-12 DIAGNOSIS — Z95828 Presence of other vascular implants and grafts: Secondary | ICD-10-CM | POA: Diagnosis not present

## 2020-07-12 DIAGNOSIS — I6523 Occlusion and stenosis of bilateral carotid arteries: Secondary | ICD-10-CM

## 2020-07-12 DIAGNOSIS — Z9861 Coronary angioplasty status: Secondary | ICD-10-CM

## 2020-07-12 MED ORDER — ATORVASTATIN CALCIUM 40 MG PO TABS: 40.0000 mg | ORAL_TABLET | Freq: Every day | ORAL | 3 refills | Status: DC

## 2020-07-12 NOTE — Patient Instructions (Signed)
Medication Instructions:    take 2 tablets of 20 mg Atorvastatin until bottle is empty then switch to 40 mg  One tablet daily   *If you need a refill on your cardiac medications before your next appointment, please call your pharmacy*   Lab Work:  in 2 weeks =CMP  LIPID -fasting   6 months _NOV 2022  Lipid hepatic   If you have labs (blood work) drawn today and your tests are completely normal, you will receive your results only by: Marland Kitchen MyChart Message (if you have MyChart) OR . A paper copy in the mail If you have any lab test that is abnormal or we need to change your treatment, we will call you to review the results.   Testing/Procedures: Not needed    Follow-Up: At [facility], you and your health needs are our priority.  As part of our continuing mission to provide you with exceptional heart care, we have created designated Provider Care Teams.  These Care Teams include your primary Cardiologist (physician) and Advanced Practice Providers (APPs -  Physician Assistants and Nurse Practitioners) who all work together to provide you with the care you need, when you need it.  We recommend signing up for the patient portal called "MyChart".  Sign up information is provided on this After Visit Summary.  MyChart is used to connect with patients for Virtual Visits (Telemedicine).  Patients are able to view lab/test results, encounter notes, upcoming appointments, etc.  Non-urgent messages can be sent to your provider as well.   To learn more about what you can do with MyChart, go to [email or web address].    Your next appointment:   6 month(s)  The format for your next appointment:   In Person  Provider:   Glenetta Hew, MD   Other Instructions   Keep blood pressure log  For 2 weeks and bring it back when you get  Lab work done

## 2020-07-12 NOTE — Progress Notes (Signed)
Primary Care Provider: Neale Burly, MD Cardiologist: Glenetta Hew, MD Electrophysiologist: None  Clinic Note: Chief Complaint  Patient presents with   Coronary Artery Disease    As usual no angina   Hypertension    Seems to be a little better controlled at home, but high today.    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 (Chronic)    Remained stable.  He never had anginal symptoms despite having multivessel disease.  This is the major concerning feature is that neither when he had his initial evaluation leading to CABG or when he was found to have 2 grafts closed did he have any symptoms.  It was all found on Myoview.  Plan: Continue maintenance dose Plavix/clopidogrel for extensive PCI Okay to hold Plavix 5 days preop for most surgeries, 7 days for high risk surgeries or procedures.  (Neurologic/spinal) On stable dose of carvedilol and now increasing dose of amlodipine for antianginal benefit.  This is along with stable dose of Micardis and HCTZ. Lipids not at goal, will increase atorvastatin to 40 mg until complete and then switch to rosuvastatin 40 mg. Will wait until next visit to consider ordering follow-up stress test.       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 Status post PCI to SVG-RI (Chronic)    On stable regimen.  Remains asymptomatic.  Consider stress test next visit.       Relevant Medications   atorvastatin (LIPITOR) 40 MG tablet   Resistant hypertension - Primary (Chronic)    Blood pressure still not quite at goal.  He is on 25 mg twice daily carvedilol, 25 mg HCTZ and 160 mg Micardis along with 5 mg Norvasc.  Plan: Increase Norvasc/amlodipine to 10 mg daily. Maintain blood pressure log for the next 2weeks, he will bring it into the office when he comes in for labs.       Relevant Medications   atorvastatin (LIPITOR) 40 MG tablet   Other Relevant Orders   Lipid panel   Comprehensive metabolic panel   Hepatic function panel   Lipid panel   Dyslipidemia associated with type 2 diabetes mellitus (HCC) (Chronic)    Unfortunately, his lipid panel was not at goal.  LDL 115.  Plan:  Increase atorvastatin to 40 mg until current bottle complete and switch to rosuvastatin 40 mg for next refill. Return for labs in roughly 2 weeks-lipid panel and chemistry panel.  Follow-up labs in November. Discussed importance of adjusting diet.  He is exercising  A1c 6.5 on metformin only. 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  Now only HFpEF.  Continue blood pressure management.  Euvolemic with no heart failure symptoms.       Relevant Medications   atorvastatin (LIPITOR) 40 MG tablet   Other Relevant Orders   EKG 12-Lead (Completed)   Lipid panel   Comprehensive metabolic panel   Hepatic function panel   Lipid panel   Carotid artery narrowings (Chronic)    Stable moderate carotid disease.  Follow-up Dopplers  annually.       Relevant Medications   atorvastatin (LIPITOR) 40 MG tablet   Other Relevant Orders   Lipid panel   Comprehensive metabolic panel    ===================================  HPI:    [name] is a 80 y.o. male with a PMH below who presents today for 20-month follow-up.   PMH of CAD, PAD & Mild ICM with notable CRFs reviewed below who presents today for 6 month f/u.    Initial cardiac evaluation was for Preoperative Risk Stratification back in April 2012.-> Nuclear stress test was grossly abnormal and cardiac cath showed MV CAD => referred for CABG. 2 years later - Abnormal surveillance Stress Test: Return the Cath Lab: Occluded SVG -RCA & SVG-OM1-OM 2 --> PCI of native LCx-OM1 and SVG-RI Myoview 04/2016 - read as HIGH RISK b/c reduced LVEF -- Echo confirmed relatively Normal EF. -- checked Echo.  No ischemia - Med Rx.  2-D echo 03/21/2018: EF ~55-60% with GRD 1 DD.  Moderate LA dilation.  Moderate aortic thickening/sclerosis no stenosis.  [name] was last seen on January 11, 2020.  This was an unexpected visit, but he was doing pretty well.  Was still not comfortable going back to the gym because of COVID-19.  His children Charlie Pitter did not to get him a treadmill machine that he was using with some regularity for least 30 minutes most days.  As is usual, no signs or symptoms of angina or exertional dyspnea.  Interestingly, his systolic blood pressure ranges at home or still in the 140-150 mmHg range.=> Added amlodipine 5 mg daily  Recent Hospitalizations: None  Reviewed  CV studies:    The following studies were reviewed today: (if available, images/films reviewed: From Epic Chart or Care Everywhere) Carotid Dopplers 12/28/2019: R ICA 1-39%, L ICA 40-59%. Bilateral Vertebral Arteries & Subclavian Arteries show normal flow hemodynamics. -- STABLE   Interval History:   [name] returns here today for 35-month follow-up doing pretty well.  He has been  back is really starting to limit him some.  He was starting to get some benefit from the injections, but now the injections are not working as well.  He is little bit frustrated, still is working on doing his treadmill walking but has to stop more frequently.  He tries to get some type of exercise and just about every day, and denies any resting or exertional chest tightness/pressure or dyspnea.  He said this morning on his blood home blood pressure cuff, his pressures was about 122/72 mmHg, but there have been readings as high as the 150s like they are today.  CV Review of Symptoms (Summary): no chest pain or dyspnea on exertion positive for - edema and this is more of mild end of day swelling negative for - irregular heartbeat, orthopnea, palpitations, paroxysmal nocturnal  dyspnea, rapid heart rate, shortness of breath, or he may get some lightheadedness starting for stands up on occasion, but no syncope/near syncope or TIA/amaurosis fugax.  No claudication.  REVIEWED OF SYSTEMS   Review of Systems  Constitutional:  Negative for malaise/fatigue (Somewhat limited by back pain now) and weight loss.  HENT:  Negative for nosebleeds.   Respiratory:  Negative for cough, shortness of breath and wheezing.   Gastrointestinal:  Negative for blood in stool and melena.  Genitourinary:  Negative for hematuria.  Musculoskeletal:  Positive for back pain and joint pain (Associated back pain his hips hurt him some to). Negative for falls.  Neurological:  Positive for dizziness (Sometimes positional with turning his head or standing up too quickly). Negative for focal weakness and weakness.  Psychiatric/Behavioral: Negative.     I have reviewed and (if needed) personally updated the patient's problem list, medications, allergies, past medical and surgical history, social and family history.   PAST MEDICAL HISTORY   Past Medical History:  Diagnosis Date   CAD, multiple vessel 05/2010; 03/2012   Dr. Ellyn Hack  (Cardiologist): For Abnormal ST: CABG X 5; cath 03/2012 --> LIMA-LAD - patent, SVG-Ramus - anastamotic 80% - DES stent; SVG-RCA & SVG-OM1-OM2-- occluded; DES Stent to Cx-OM1   Cardiomyopathy, ischemic- moderate LVD (EF of roughly 45%) at cath 2/143 11/03/2012   Resolved - f/u Echo with EF 55-60%   Carotid artery narrowings 03/2018   Moderate L ICA 40-59%   Carpal tunnel syndrome on right    Chronic back pain    spinal stenosis and scoliosis   DDD (degenerative disc disease), lumbosacral    Diabetes type 2, controlled ([place])    takes Metformin once a wk   DJD (degenerative joint disease) 9/12   Rt TKR   Dyslipidemia    takes Simvastatin daily   First degree heart block    GERD (gastroesophageal reflux disease)    History of colon polyps    benign   History of shingles    forehead --  dec 2016--  no residual pain   Osteoarthritis    Peripheral arterial disease ([place]) May 2016  (seen by dr berry)   L. ABI 0.58 with high grade L. SFA stenosis and occluded L. DP. R. ABI 0.48 with occluded R. SFA and occluded R. DP.   Pneumonia    hx of-as a child   Resistant hypertension    takes Coreg,HCTZ,and Micardis daily   Weakness    numbness and tingling    PAST SURGICAL HISTORY   Past Surgical History:  Procedure Laterality Date   Carotid Dopplers  03/2018    Normal LV size and function.  EF 55 to 60%.  No R WMA.  Impaired relaxation (GR 1 DD).  Moderate LA dilation.  Moderate aortic thickening and calcification-sclerosis with no stenosis.   CARPAL TUNNEL RELEASE Right 10/10/2015   Procedure: RIGHT HAND CARPAL TUNNEL RELEASE;  Surgeon: Iran Planas, MD;  Location: [facility];  Service: Orthopedics;  Laterality: Right;   COLONOSCOPY     CORONARY ANGIOPLASTY     3 stents   CORONARY ARTERY BYPASS GRAFT  05/22/2010  dr Lucianne Lei tright   x 5 (LIMA-LAD, SVG- RI,. SVG-OM1-OM2, SVG-dRCA)   DECOMPRESSIVE LUMBAR LAMINECTOMY LEVEL 1 N/A 03/08/2016   Procedure: Lumbar 3-5 decompression with  in situ fusion;  Surgeon: Melina Schools, MD;  Location: [facility];  Service: Orthopedics;  Laterality: N/A;   LEFT HEART CATH AND CORONARY ANGIOGRAPHY  05/15/2010  abnormal stress test-- 3 vessel CAD/  LVEDP=60mmHg  (Dr. Debara Pickett for Dr,. Ellyn Hack)    LEFT HEART CATHETERIZATION WITH CORONARY ANGIOGRAM N/A 04/02/2012   Procedure: LEFT HEART CATHETERIZATION WITH CORONARY ANGIOGRAM;  Surgeon: Leonie Man, MD;  Location: [facility] CATH LAB: Occluded SVG-rPDA & native RCA, 100% SVG-OM. Severe anastomotic lesion in SVG-RI.  Patent LIMA-LAD. Severe mid Cx-OM   NM MYOVIEW LTD  03/25/2012   Dr. Ellyn Hack: High Risk exercise nuclear study w/ scar in the apex and upper mid to basal inferior wall w/ signgicnat additional iscemia inferiorly to lateral-inferiorly from apex to base/  severe LV dysfunction w/ infero-apical dyskinesis and hypocontractility in the inferior and lateral wall, ef 33%   NM MYOVIEW LTD  04/2016   large-size, moderate intensity reversible perfusion defect in the inferior wall suggestive of ischemia. EF 34%. Global hypokinesis and severe inferior hypokinesis to akinesis. Also noted horizontal ST segment depression in inferior-lateralleads. HIGH RISK   PERCUTANEOUS CORONARY STENT INTERVENTION (PCI-S) N/A 04/08/2012   Procedure: PERCUTANEOUS CORONARY STENT INTERVENTION (PCI-S);  Surgeon: Leonie Man, MD;  Location: [facility] CATH LAB: Promus Premier DES 2.25 mm x 12 mm - Mid Cx-OM1; Promus Premier DES 2.5 mm x 16 mm -- anastomotic SVG-RI (graft into native)   TOTAL KNEE ARTHROPLASTY Right 10/26/2010   TRANSTHORACIC ECHOCARDIOGRAM  08/21/2010   mild LVH, EF 45-50%, inferior hypokinesis,  grade 1 diastolic dysfunction/  moderate LAE/   mild MR/  trivial TR/  mild dilated RV   TRANSTHORACIC ECHOCARDIOGRAM  03/2018    Normal LV size and function.  EF 55 to 60%.  No R WMA.  Impaired relaxation (GR 1 DD).  Moderate LA dilation.  Moderate aortic thickening and calcification-sclerosis with no stenosis.   UMBILICAL  HERNIA REPAIR  1980's     There is no immunization history on file for this patient.  MEDICATIONS/ALLERGIES   Current Meds  Medication Sig   amLODipine (NORVASC) 5 MG tablet Take 1 tablet (5 mg total) by mouth daily.   atorvastatin (LIPITOR) 40 MG tablet Take 1 tablet (40 mg total) by mouth daily.   carvedilol (COREG) 25 MG tablet TAKE 1 TABLET (25 MG TOTAL) BY MOUTH 2 (TWO) TIMES DAILY.   clopidogrel (PLAVIX) 75 MG tablet TAKE 1 TABLET BY MOUTH EVERY DAY   fish oil-omega-3 fatty acids 1000 MG capsule Take 1 g by mouth daily.    hydrochlorothiazide (HYDRODIURIL) 25 MG tablet TAKE 1 TABLET BY MOUTH EVERY DAY   metFORMIN (GLUCOPHAGE) 500 MG tablet Take 500 mg by mouth daily with breakfast.   nitroGLYCERIN (NITROSTAT) 0.4 MG SL tablet Place 1 tablet (0.4 mg total) under the tongue every 5 (five) minutes as needed for chest pain.   telmisartan (MICARDIS) 80 MG tablet TAKE 2 TABLETS BY MOUTH EVERY DAY   triamcinolone cream (KENALOG) 0.1 % EVERY DAY   [DISCONTINUED] atorvastatin (LIPITOR) 20 MG tablet TAKE 1 TABLET BY MOUTH EVERY DAY    Allergies  Allergen Reactions   No Known Allergies     SOCIAL HISTORY/FAMILY HISTORY   Reviewed in Epic:  Pertinent findings:  Social History   Tobacco Use   Smoking status: Former    Pack years: 0.00    Types: Cigarettes    Quit date: 09/26/1980    Years since quitting: 39.8   Smokeless tobacco: Never  Vaping Use   Vaping Use: Never used  Substance Use Topics   Alcohol use: No   Drug use: No   Social History   Social History  Narrative   He is a married father of 2. He exercises routinely at the [facility].[employer] Kitchen    He quit smoking about 30 years ago and has social alcohol.     OBJCTIVE -PE, EKG, labs   Wt Readings from Last 3 Encounters:  07/12/20 218 lb 9.6 oz (99.2 kg)  01/11/20 215 lb 9.6 oz (97.8 kg)  12/28/19 214 lb (97.1 kg)    Physical Exam: BP (!) 150/84 (BP Location: Left Arm, Patient Position: Sitting, Cuff Size: Normal)    Pulse 63   Ht 6\' 2"  (1.88 m)   Wt 218 lb 9.6 oz (99.2 kg)   BMI 28.07 kg/m  Physical Exam    Adult ECG Report  Rate: 63 ;  Rhythm: normal sinus rhythm and LVH with repolarization.  Cannot exclude inferior MI, age-indeterminate. ; Otherwise normal axis, intervals and durations.  Narrative Interpretation: Stable  Recent Labs:   02/02/2020: TC 181, TG 87, HDL 50, LDL 115.  A1c 6.5. No results found for: CHOL, HDL, LDLCALC, LDLDIRECT, TRIG, CHOLHDL Lab Results  Component Value Date   CREATININE 1.47 (H) 04/07/2019   BUN 25 (H) 04/07/2019   NA 131 (L) 04/07/2019   K 4.1 04/07/2019   CL 100 04/07/2019   CO2 22 04/07/2019   CBC Latest Ref Rng & Units 03/02/2016 10/10/2015 04/09/2012  WBC 4.0 - 10.5 K/uL 6.1 - 6.7  Hemoglobin 13.0 - 17.0 g/dL 12.5(L) 13.3 13.0  Hematocrit 39.0 - 52.0 % 38.0(L) 39.0 37.5(L)  Platelets 150 - 400 K/uL 145(L) - 178    No results found for: TSH  ==================================================  COVID-19 Education: The signs and symptoms of COVID-19 were discussed with the patient and how to seek care for testing (follow up with PCP or arrange E-visit).    I spent a total of 68minutes with the patient spent in direct patient consultation.  Additional time spent with chart review  / charting (studies, outside notes, etc): 15 min Total Time: 48 min  Current medicines are reviewed at length with the patient today.  (+/- concerns) N/A  This visit occurred during the SARS-CoV-2 public health emergency.  Safety protocols were in place, including screening questions prior to the visit, additional usage of staff PPE, and extensive cleaning of exam room while observing appropriate contact time as indicated for disinfecting solutions.  Notice: This dictation was prepared with Dragon dictation along with smaller phrase technology. Any transcriptional errors that result from this process are unintentional and may not be corrected upon review.  Patient  Instructions / Medication Changes & Studies & Tests Ordered   Patient Instructions  Medication Instructions:    take 2 tablets of 20 mg Atorvastatin until bottle is empty then switch to 40 mg  One tablet daily   *If you need a refill on your cardiac medications before your next appointment, please call your pharmacy*   Lab Work:  in 2 weeks =CMP  LIPID -fasting   6 months _NOV 2022  Lipid hepatic   If you have labs (blood work) drawn today and your tests are completely normal, you will receive your results only by: [place] (if you have MyChart) OR A paper copy in the mail If you have any lab test that is abnormal or we need to change your treatment, we will call you to review the results.   Testing/Procedures: Not needed    Follow-Up: At [facility], you and your health needs are our priority.  As part of our continuing mission to provide  you with exceptional heart care, we have created designated Provider Care Teams.  These Care Teams include your primary Cardiologist (physician) and Advanced Practice Providers (APPs -  Physician Assistants and Nurse Practitioners) who all work together to provide you with the care you need, when you need it.  We recommend signing up for the patient portal called "MyChart".  Sign up information is provided on this After Visit Summary.  MyChart is used to connect with patients for Virtual Visits (Telemedicine).  Patients are able to view lab/test results, encounter notes, upcoming appointments, etc.  Non-urgent messages can be sent to your provider as well.   To learn more about what you can do with MyChart, go to [email or web address].    Your next appointment:   6 month(s)  The format for your next appointment:   In Person  Provider:   Glenetta Hew, MD   Other Instructions   Keep blood pressure log  For 2 weeks and bring it back when you get  Lab work done    Studies Ordered:   Orders Placed This Encounter   Procedures   Lipid panel   Comprehensive metabolic panel   Hepatic function panel   Lipid panel   EKG 12-Lead     Glenetta Hew, M.D., M.S. Interventional Cardiologist   Pager # [phone] Phone # [phone] [address] [place],  [address]   Thank you for choosing Heartcare at [facility]!!

## 2020-07-23 ENCOUNTER — Encounter: Payer: Self-pay | Admitting: Cardiology

## 2020-07-23 NOTE — Assessment & Plan Note (Signed)
Remained stable.  He never had anginal symptoms despite having multivessel disease.  This is the major concerning feature is that neither when he had his initial evaluation leading to CABG or when he was found to have 2 grafts closed did he have any symptoms.  It was all found on Myoview.  Plan:  Continue maintenance dose Plavix/clopidogrel for extensive PCI  Okay to hold Plavix 5 days preop for most surgeries, 7 days for high risk surgeries or procedures.  (Neurologic/spinal)  On stable dose of carvedilol and now increasing dose of amlodipine for antianginal benefit.  This is along with stable dose of Micardis and HCTZ.  Lipids not at goal, will increase atorvastatin to 40 mg until complete and then switch to rosuvastatin 40 mg.  Will wait until next visit to consider ordering follow-up stress test.

## 2020-07-23 NOTE — Assessment & Plan Note (Signed)
Blood pressure still not quite at goal.  He is on 25 mg twice daily carvedilol, 25 mg HCTZ and 160 mg Micardis along with 5 mg Norvasc.  Plan: Increase Norvasc/amlodipine to 10 mg daily.  Maintain blood pressure log for the next 2weeks, he will bring it into the office when he comes in for labs.

## 2020-07-23 NOTE — Assessment & Plan Note (Signed)
Stable moderate carotid disease.  Follow-up Dopplers annually.

## 2020-07-23 NOTE — Assessment & Plan Note (Signed)
Resolved.  Now only HFpEF.  Continue blood pressure management.  Euvolemic with no heart failure symptoms.

## 2020-07-23 NOTE — Assessment & Plan Note (Signed)
On stable regimen.  Remains asymptomatic.  Consider stress test next visit.

## 2020-07-23 NOTE — Assessment & Plan Note (Signed)
Unfortunately, his lipid panel was not at goal.  LDL 115.  Plan:   Increase atorvastatin to 40 mg until current bottle complete and switch to rosuvastatin 40 mg for next refill.  Return for labs in roughly 2 weeks-lipid panel and chemistry panel.  Follow-up labs in November.  Discussed importance of adjusting diet.  He is exercising  A1c 6.5 on metformin only. >  Would consider SGLT2 inhibitor or GLP-1 agonist

## 2020-07-28 LAB — COMPREHENSIVE METABOLIC PANEL
ALT: 13 IU/L (ref 0–44)
AST: 16 IU/L (ref 0–40)
Albumin/Globulin Ratio: 1.5 (ref 1.2–2.2)
Albumin: 4.4 g/dL (ref 3.7–4.7)
Alkaline Phosphatase: 78 IU/L (ref 44–121)
BUN/Creatinine Ratio: 14 (ref 10–24)
BUN: 18 mg/dL (ref 8–27)
Bilirubin Total: 0.9 mg/dL (ref 0.0–1.2)
CO2: 19 mmol/L — ABNORMAL LOW (ref 20–29)
Calcium: 10.3 mg/dL — ABNORMAL HIGH (ref 8.6–10.2)
Chloride: 103 mmol/L (ref 96–106)
Creatinine, Ser: 1.27 mg/dL (ref 0.76–1.27)
Globulin, Total: 2.9 g/dL (ref 1.5–4.5)
Glucose: 105 mg/dL — ABNORMAL HIGH (ref 65–99)
Potassium: 5.3 mmol/L — ABNORMAL HIGH (ref 3.5–5.2)
Sodium: 139 mmol/L (ref 134–144)
Total Protein: 7.3 g/dL (ref 6.0–8.5)
eGFR: 58 mL/min/{1.73_m2} — ABNORMAL LOW (ref >59)

## 2020-07-28 LAB — LIPID PANEL
Chol/HDL Ratio: 3.8 ratio (ref 0.0–5.0)
Cholesterol, Total: 173 mg/dL (ref 100–199)
HDL: 45 mg/dL (ref >39)
LDL Chol Calc (NIH): 111 mg/dL — ABNORMAL HIGH (ref 0–99)
Triglycerides: 89 mg/dL (ref 0–149)
VLDL Cholesterol Cal: 17 mg/dL (ref 5–40)

## 2020-08-04 ENCOUNTER — Telehealth: Payer: Self-pay | Admitting: *Deleted

## 2020-08-04 NOTE — Telephone Encounter (Signed)
-----   Message from Leonie Man, MD sent at 07/30/2020  5:07 PM EDT ----- Cholesterol panel is pretty much stable from 6 months ago.  LDL was 111.  Target is less than 70.  Hopefully by increasing your cholesterol medicine to 40 mg and then changing to to rosuvastatin 40 mg, your labs will improve by follow-up check.  Chemistry panel is pretty stable.  Kidney function has improved a little bit.  Potassium levels are little high.  I would like for you to hold your telmisartan for 2 to 3 days, then restart.  Glenetta Hew, MD

## 2020-08-04 NOTE — Telephone Encounter (Signed)
Left message to call back for result

## 2020-08-05 NOTE — Telephone Encounter (Signed)
The patient's wife has been notified of the result and verbalized understanding.  All questions (if any) were answered. Raiford Simmonds, RN 08/05/2020 1:49 PM

## 2020-12-09 ENCOUNTER — Telehealth: Payer: Self-pay | Admitting: Cardiology

## 2020-12-09 NOTE — Telephone Encounter (Signed)
New Message:    Patient's wife wants to know if patient can have his lab work at [facility] in [place]?

## 2020-12-09 NOTE — Telephone Encounter (Signed)
Spoke to wife.  It will be okay to have labs done at [facility] or [place]. Voice understanding

## 2020-12-13 ENCOUNTER — Telehealth: Payer: Self-pay | Admitting: Cardiology

## 2020-12-13 NOTE — Telephone Encounter (Signed)
Patient's wife wanting to speak with the nurse in regards to appt that is needed.

## 2020-12-13 NOTE — Telephone Encounter (Signed)
Left message for wife to call back - next available would be in Jan 2023. May Call back

## 2020-12-14 ENCOUNTER — Telehealth: Payer: Self-pay | Admitting: Cardiology

## 2020-12-14 NOTE — Telephone Encounter (Signed)
Spoke with patient's wife who is asking about his 12/28/20 visit. Explained this is a carotid artery u/s at Dr. Allison Quarry office. Explained he is to have the test done but also needs f/u with [facility] and Dr. Ellyn Hack. Provided her with phone # to [facility] and told her to call to schedule visit after test date

## 2020-12-14 NOTE — Telephone Encounter (Signed)
Patient's wife called in asking that the dr harding nurse give them a call. Please adviswe

## 2020-12-15 ENCOUNTER — Other Ambulatory Visit (HOSPITAL_COMMUNITY): Payer: Self-pay | Admitting: Cardiology

## 2020-12-15 DIAGNOSIS — I6523 Occlusion and stenosis of bilateral carotid arteries: Secondary | ICD-10-CM

## 2020-12-15 LAB — HEPATIC FUNCTION PANEL
ALT: 20 IU/L (ref 0–44)
AST: 18 IU/L (ref 0–40)
Albumin: 4.3 g/dL (ref 3.7–4.7)
Alkaline Phosphatase: 69 IU/L (ref 44–121)
Bilirubin Total: 0.5 mg/dL (ref 0.0–1.2)
Bilirubin, Direct: 0.12 mg/dL (ref 0.00–0.40)
Total Protein: 7.1 g/dL (ref 6.0–8.5)

## 2020-12-15 LAB — LIPID PANEL
Chol/HDL Ratio: 3.3 ratio (ref 0.0–5.0)
Cholesterol, Total: 165 mg/dL (ref 100–199)
HDL: 50 mg/dL (ref >39)
LDL Chol Calc (NIH): 102 mg/dL — ABNORMAL HIGH (ref 0–99)
Triglycerides: 66 mg/dL (ref 0–149)
VLDL Cholesterol Cal: 13 mg/dL (ref 5–40)

## 2020-12-28 ENCOUNTER — Ambulatory Visit (HOSPITAL_COMMUNITY)
Admission: RE | Admit: 2020-12-28 | Discharge: 2020-12-28 | Disposition: A | Discharge status: Home / Self Care | Payer: Medicare Other | Source: Ambulatory Visit | Attending: Cardiovascular Disease | Admitting: Cardiovascular Disease

## 2020-12-28 ENCOUNTER — Other Ambulatory Visit: Payer: Self-pay

## 2020-12-28 ENCOUNTER — Encounter (HOSPITAL_COMMUNITY): Payer: Medicare Other

## 2020-12-28 DIAGNOSIS — I6523 Occlusion and stenosis of bilateral carotid arteries: Secondary | ICD-10-CM | POA: Diagnosis present

## 2021-01-01 NOTE — Progress Notes (Signed)
Lipid panel 12/14/2020: Total cholesterol has improved to 165, HDL improved from 45-50 triglycerides reduce from 89-66.  LDL is down from 111-102 -> with increasing [place] atorvastatin from 20-40.  Unfortunately we are still not at goal. Plan - d/c Atorvastatin 40 mg - convert to Rosuvastatin 40 mg & Zetia 10 mg  New Rx: Rosuvastatin 40 mg PO daily, Disp 90, 3 refill Ezetimibe 10 mg PO daily, Disp 90, 3 refill  Recheck Lipid panel in 3 months after change --> anticipate [facility] Lipid Clinic Referral if nota @ goal.  Glenetta Hew, MD

## 2021-01-09 ENCOUNTER — Other Ambulatory Visit: Payer: Self-pay

## 2021-01-09 ENCOUNTER — Ambulatory Visit: Payer: Medicare Other | Admitting: Cardiology

## 2021-01-09 ENCOUNTER — Ambulatory Visit: Payer: Medicare Other | Admitting: Physician Assistant

## 2021-01-09 ENCOUNTER — Encounter: Payer: Self-pay | Admitting: Cardiology

## 2021-01-09 ENCOUNTER — Encounter: Payer: Self-pay | Admitting: Physician Assistant

## 2021-01-09 VITALS — BP 162/74 | HR 64 | Temp 97.9°F | Resp 20 | Ht 74.0 in | Wt 218.9 lb

## 2021-01-09 VITALS — BP 197/81 | HR 66 | Ht 74.0 in | Wt 219.0 lb

## 2021-01-09 DIAGNOSIS — R9439 Abnormal result of other cardiovascular function study: Secondary | ICD-10-CM

## 2021-01-09 DIAGNOSIS — Z9861 Coronary angioplasty status: Secondary | ICD-10-CM

## 2021-01-09 DIAGNOSIS — I2510 Atherosclerotic heart disease of native coronary artery without angina pectoris: Secondary | ICD-10-CM | POA: Diagnosis not present

## 2021-01-09 DIAGNOSIS — I255 Ischemic cardiomyopathy: Secondary | ICD-10-CM

## 2021-01-09 DIAGNOSIS — I10 Essential (primary) hypertension: Secondary | ICD-10-CM | POA: Diagnosis not present

## 2021-01-09 DIAGNOSIS — I25810 Atherosclerosis of coronary artery bypass graft(s) without angina pectoris: Secondary | ICD-10-CM

## 2021-01-09 DIAGNOSIS — I6523 Occlusion and stenosis of bilateral carotid arteries: Secondary | ICD-10-CM | POA: Diagnosis not present

## 2021-01-09 DIAGNOSIS — E785 Hyperlipidemia, unspecified: Secondary | ICD-10-CM

## 2021-01-09 DIAGNOSIS — E1169 Type 2 diabetes mellitus with other specified complication: Secondary | ICD-10-CM

## 2021-01-09 MED ORDER — AMLODIPINE BESYLATE 10 MG PO TABS: 10.0000 mg | ORAL_TABLET | Freq: Every day | ORAL | 3 refills | Status: DC

## 2021-01-09 MED ORDER — CHLORTHALIDONE 50 MG PO TABS: 50.0000 mg | ORAL_TABLET | Freq: Every day | ORAL | 3 refills | Status: DC

## 2021-01-09 NOTE — Assessment & Plan Note (Signed)
He has clear graft disease.  Not notable symptoms.  Due for Myoview stress test follow-up in March 2023.  This was 5 years out.  Based on the fact that he has no symptoms, this is our only way of finding out if he has progression of disease.

## 2021-01-09 NOTE — Progress Notes (Signed)
Primary Care Provider: Neale Burly, MD Cardiologist: Glenetta Hew, MD Electrophysiologist: None  Clinic Note: No concerns  Chief Complaint  Patient presents with   Follow-up    82-month. ->  Did not increase amlodipine, did not convert to rosuvastatin or add Zetia.   Coronary Artery Disease    No angina   Hyperlipidemia    Did not convert from atorvastatin to rosuvastatin or add Zetia   Hypertension    Continues to have a home blood pressures in the 150s and 160s, not as high as it was here today. Never increased amlodipine to 10 mg.     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 (Chronic)    Significant native CAD, completely asymptomatic at the time of his initial evaluation for preop knee surgery.  Was found to have multivessel disease sent for CABG.  He then had graft disease requiring PCI again with no symptoms.  Found to abnormal Myoview.  Continues to be stable without any active angina or heart failure symptoms on stable meds.  Plan: Continue current dose of carvedilol and telmisartan. Increasing amlodipine to 10 mg for additional antianginal benefit and blood pressure control. Continue statin for now, but anticipate converting either to a different statin or potentially adding PCSK9 inhibitor-referring to [facility] lipid clinic based on recent labs. Will be 5 yrs out from last [place] in March 2023 --> with him essentially having asymptomatic ischemic CAD, will check Myoview stress test in March 2023, prior to follow-up..      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 Status post PCI to SVG-RI (Chronic)    He has clear graft disease.  Not notable symptoms.  Due for Myoview stress test follow-up in March 2023.  This was 5 years out.  Based on the fact that he has no symptoms, this is our only way of finding out if he has progression of disease.      Relevant Medications   amLODipine (NORVASC) 10 MG tablet   chlorthalidone (HYGROTON) 50 MG tablet   Resistant hypertension - Primary (Chronic)    -uncontrolled, BP 197/81 -discontinue HCTZ, start chlorthalidone 50 mg  -increased to amlodipine 10 mg -continue spironolactone, carvedilol and telmisartan  -recheck BP at follow up with pharmacist ([facility] run by our clinical pharmacist)      Relevant Medications   amLODipine (NORVASC) 10 MG tablet   chlorthalidone (HYGROTON) 50 MG tablet   Other Relevant Orders   Cardiac Stress Test: Informed Consent Details: Physician/Practitioner Attestation; Transcribe to consent form and obtain patient signature   AMB Referral to 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  No active heart failure symptoms.  Plan: Continue current dose of carvedilol and Micardis. Increase amlodipine to 10 mg daily, converting from HCTZ to chlorthalidone 50 mg  daily.      Relevant Medications   amLODipine (NORVASC) 10 MG tablet   chlorthalidone (HYGROTON) 50 MG tablet   Other Relevant Orders   Cardiac Stress Test: Informed Consent Details: Physician/Practitioner Attestation; Transcribe to consent form and obtain patient signature   AMB Referral to [place] metabolic panel      Other   S/P elective  PTCA / DES to SVG-RI and OM1 04/08/12 (Chronic)   Relevant Orders   Cardiac Stress Test: Informed Consent Details: Physician/Practitioner Attestation; Transcribe to consent form and obtain patient signature   AMB Referral to [place]   Dyslipidemia associated with type 2 diabetes mellitus ([place]) (Chronic)    Hyperlipidemia - with DM-2 -continue atorvastatin (since he never converted to rosuvastatin) -given not at goal, will Refer to [facility] run by [facility] Clinical Pharmacists, patient may] benefit from PCSK9-Inibitior -(Last A1c 7.0) consider SGLT-2+/- GLP1 agonist for DM-2       Relevant Orders   Cardiac Stress Test: Informed Consent Details: Physician/Practitioner Attestation; Transcribe to consent form and obtain patient signature   Abnormal nuclear stress test (Chronic)    Most recent Myoview in 2018 was clearly abnormal.  Findings are consistent with known coronary anatomy.  There is no evidence of true ischemia besides what would already be known.  Plan will be to recheck Lexiscan Myoview 5 years out which would be March 2023.  Would check Lexiscan not treadmill Myoview -> he is not able to walk on a treadmill for prolonged period time because his back, but also EKG changes would be read as abnormal.      Relevant Orders   Cardiac Stress Test: Informed Consent Details: Physician/Practitioner Attestation; Transcribe to consent form and obtain patient signature   AMB Referral to [place] metabolic panel     CVRR referral Follow up in 1-2 months. --   Signed:  Donney Dice, DO - Guadalupe Dawn, Fam Med  Attested:  Glenetta Hew, MD  ===================================  HPI:    ZELIG GACEK is a 80 y.o. male with a PMH below who presents today for 59-month follow up. Marland Kitchen PMH of CAD, PAD & Mild ICM with notable CRFs reviewed below who presents today for 6 month  f/u.   CAD-CABG-PCI; (resistant) HTN/HLD, DM-2 Initial cardiac evaluation was for Preoperative Risk Stratification back in April 2012.-> Nuclear stress test was grossly abnormal and cardiac cath showed MV CAD => referred for CABG. 2 years later - Abnormal surveillance Stress Test: Return the Cath Lab: Occluded SVG -RCA & SVG-OM1-OM 2 --> PCI of native LCx-OM1 and SVG-RI Myoview 04/2016 - read as HIGH RISK b/c reduced LVEF -- Echo confirmed relatively Normal EF. -- checked Echo.  No ischemia - Med Rx.  2-D echo 03/21/2018: EF ~55-60% with GRD 1 DD.  Moderate LA dilation.  Moderate aortic thickening/sclerosis no stenosis.  [name] was last seen on 07/12/2020.  As usual, he was doing fairly well with no active cardiac symptoms.  He has never had any chest pain.  He was mostly limited by significant back pain which is limiting [place] walk on the treadmill.  Try to get exercise, but does not as much that he is able to.  He noted home blood pressures of 120s to 130s over 70s as opposed to the 150s-160s in the office. -> Only noted some mild end of day edema.  Otherwise negative cardiac symptoms The plan was to increase Norvasc to 10 mg daily, convert to rosuvastatin 40 mg daily and recheck labs in November.  Also suggested SGLT2 inhibitor or GLP-1 agonist. => Unfortunately, clearly this did not happen After labs were checked on 12/14/2020: Again recommendation was DC atorvastatin convert to rosuvastatin 40 mg plus Zetia 10 mg and anticipate referral to [facility] lipid clinic.  Recent Hospitalizations: None  Reviewed  CV studies:    The following studies were reviewed today: (if available, images/films reviewed: From Epic Chart or Care Everywhere) None   Interval History:   COTE MAYABB is a 80 year old male with a past medical history of CAD s/p CABG, hypertension and hyperlipidemia presents for follow up visit. Denies any concerns today. Denies chest pain, dyspnea, palpitations, vision  changes, headaches, dizziness, leg swelling and syncopal episodes. Exercises regularly, at least 3-5 times weekly, goes to the [facility].  -> He denies any headaches or blurred vision, dizziness or wooziness.  Still notes that he would not build to walk on a treadmill as his back is still bothering him.  He tries to do light workouts on the treadmill at home, but is limited by his back.  He cannot run, generally to be walks slowly has to stop intermittently.  CV Review of Symptoms (Summary)  negative for - chest pain, dyspnea on exertion, edema, irregular heartbeat, murmur, palpitations, or shortness of breath; lightheadedness or dizziness or wooziness, syncope/near syncope or TIA/amaurosis fugax, claudication   REVIEWED OF SYSTEMS   Review of Systems  Eyes:  Negative for blurred vision and double vision.  Respiratory:  Negative for shortness of breath.   Cardiovascular:  Negative for chest pain, palpitations and leg swelling.  Gastrointestinal:  Negative for blood in stool and melena.  Genitourinary:  Negative for hematuria.  Musculoskeletal:  Positive for back pain (Very limiting.) and joint pain.  Neurological:  Negative for dizziness, weakness and headaches.  Endo/Heme/Allergies:  Does not bruise/bleed easily.  Psychiatric/Behavioral: Negative.     I have reviewed and (if needed) personally updated the patient's problem list, medications, allergies, past medical and surgical history, social and family history.   PAST MEDICAL HISTORY   Past Medical History:  Diagnosis Date   CAD, multiple vessel 05/2010; 03/2012   Dr. Ellyn Hack (Cardiologist): For Abnormal ST: CABG X 5; cath 03/2012 --> LIMA-LAD - patent, SVG-Ramus - anastamotic 80% - DES stent; SVG-RCA & SVG-OM1-OM2-- occluded; DES Stent to Cx-OM1   Cardiomyopathy, ischemic- moderate LVD (EF of roughly 45%) at cath 2/143 11/03/2012   Resolved - f/u Echo with EF 55-60%   Carotid artery narrowings 03/2018   Moderate L ICA 40-59%   Carpal  tunnel syndrome on right    Chronic back pain    spinal stenosis and scoliosis   DDD (degenerative disc disease), lumbosacral    Diabetes type 2, controlled ([place])    takes Metformin once a wk   DJD (degenerative joint disease) 9/12   Rt TKR   Dyslipidemia    takes Simvastatin daily   First degree heart block    GERD (gastroesophageal reflux disease)    History of colon polyps    benign   History of shingles    forehead --  dec 2016--  no residual pain   Osteoarthritis    Peripheral arterial disease ([place]) May 2016  (seen by dr berry)   L. ABI 0.58 with high grade L. SFA stenosis and occluded L. DP. R. ABI 0.48 with occluded R.  SFA and occluded R. DP.   Pneumonia    hx of-as a child   Resistant hypertension    takes Coreg,HCTZ,and Micardis daily   Weakness    numbness and tingling    PAST SURGICAL HISTORY   Past Surgical History:  Procedure Laterality Date   Carotid Dopplers  03/2018    Normal LV size and function.  EF 55 to 60%.  No R WMA.  Impaired relaxation (GR 1 DD).  Moderate LA dilation.  Moderate aortic thickening and calcification-sclerosis with no stenosis.   CARPAL TUNNEL RELEASE Right 10/10/2015   Procedure: RIGHT HAND CARPAL TUNNEL RELEASE;  Surgeon: Iran Planas, MD;  Location: [facility];  Service: Orthopedics;  Laterality: Right;   COLONOSCOPY     CORONARY ANGIOPLASTY     3 stents   CORONARY ARTERY BYPASS GRAFT  05/22/2010  dr Lucianne Lei tright   x 5 (LIMA-LAD, SVG- RI,. SVG-OM1-OM2, SVG-dRCA)   DECOMPRESSIVE LUMBAR LAMINECTOMY LEVEL 1 N/A 03/08/2016   Procedure: Lumbar 3-5 decompression with in situ fusion;  Surgeon: Melina Schools, MD;  Location: [facility];  Service: Orthopedics;  Laterality: N/A;   LEFT HEART CATH AND CORONARY ANGIOGRAPHY  05/15/2010   abnormal stress test-- 3 vessel CAD/  LVEDP=75mmHg  (Dr. Debara Pickett for Dr,. Ellyn Hack)    LEFT HEART CATHETERIZATION WITH CORONARY ANGIOGRAM N/A 04/02/2012   Procedure: LEFT HEART CATHETERIZATION WITH CORONARY  ANGIOGRAM;  Surgeon: Leonie Man, MD;  Location: [facility] CATH LAB: Occluded SVG-rPDA & native RCA, 100% SVG-OM. Severe anastomotic lesion in SVG-RI.  Patent LIMA-LAD. Severe mid Cx-OM   NM MYOVIEW LTD  03/25/2012   Dr. Ellyn Hack: High Risk exercise nuclear study w/ scar in the apex and upper mid to basal inferior wall w/ signgicnat additional iscemia inferiorly to lateral-inferiorly from apex to base/  severe LV dysfunction w/ infero-apical dyskinesis and hypocontractility in the inferior and lateral wall, ef 33%   NM MYOVIEW LTD  04/2016   large-size, moderate intensity reversible perfusion defect in the inferior wall suggestive of ischemia. EF 34%. Global hypokinesis and severe inferior hypokinesis to akinesis. Also noted horizontal ST segment depression in inferior-lateralleads. HIGH RISK   PERCUTANEOUS CORONARY STENT INTERVENTION (PCI-S) N/A 04/08/2012   Procedure: PERCUTANEOUS CORONARY STENT INTERVENTION (PCI-S);  Surgeon: Leonie Man, MD;  Location: [facility] CATH LAB: Promus Premier DES 2.25 mm x 12 mm - Mid Cx-OM1; Promus Premier DES 2.5 mm x 16 mm -- anastomotic SVG-RI (graft into native)   TOTAL KNEE ARTHROPLASTY Right 10/26/2010   TRANSTHORACIC ECHOCARDIOGRAM  08/21/2010   mild LVH, EF 45-50%, inferior hypokinesis,  grade 1 diastolic dysfunction/  moderate LAE/   mild MR/  trivial TR/  mild dilated RV   TRANSTHORACIC ECHOCARDIOGRAM  03/2018    Normal LV size and function.  EF 55 to 60%.  No R WMA.  Impaired relaxation (GR 1 DD).  Moderate LA dilation.  Moderate aortic thickening and calcification-sclerosis with no stenosis.   UMBILICAL HERNIA REPAIR  1980's     There is no immunization history on file for this patient.  MEDICATIONS/ALLERGIES   Current Meds  Medication Sig   amLODipine (NORVASC) 10 MG tablet Take 1 tablet (10 mg total) by mouth daily.   atorvastatin (LIPITOR) 40 MG tablet Take 1 tablet (40 mg total) by mouth daily.   carvedilol (COREG) 25 MG tablet TAKE 1 TABLET (25 MG  TOTAL) BY MOUTH 2 (TWO) TIMES DAILY.   chlorthalidone (HYGROTON) 50 MG tablet Take 1 tablet (50 mg total) by mouth  daily.   clopidogrel (PLAVIX) 75 MG tablet TAKE 1 TABLET BY MOUTH EVERY DAY   fish oil-omega-3 fatty acids 1000 MG capsule Take 1 g by mouth daily.    metFORMIN (GLUCOPHAGE) 500 MG tablet Take 500 mg by mouth daily with breakfast.   nitroGLYCERIN (NITROSTAT) 0.4 MG SL tablet Place 1 tablet (0.4 mg total) under the tongue every 5 (five) minutes as needed for chest pain.   telmisartan (MICARDIS) 80 MG tablet TAKE 2 TABLETS BY MOUTH EVERY DAY   triamcinolone cream (KENALOG) 0.1 % EVERY DAY   [DISCONTINUED] amLODipine (NORVASC) 5 MG tablet Take 1 tablet (5 mg total) by mouth daily.   [DISCONTINUED] hydrochlorothiazide (HYDRODIURIL) 25 MG tablet TAKE 1 TABLET BY MOUTH EVERY DAY    Allergies  Allergen Reactions   No Known Allergies     SOCIAL HISTORY/FAMILY HISTORY   Reviewed in Epic:  Pertinent findings:  Social History   Tobacco Use   Smoking status: Former    Types: Cigarettes    Quit date: 09/26/1980    Years since quitting: 40.3   Smokeless tobacco: Never  Vaping Use   Vaping Use: Never used  Substance Use Topics   Alcohol use: No   Drug use: No   Social History   Social History Narrative   He is a married father of 2. He exercises routinely at the [facility]Marland Kitchen    He quit smoking about 30 years ago and has social alcohol.     OBJCTIVE -PE, EKG, labs   Wt Readings from Last 3 Encounters:  01/09/21 219 lb (99.3 kg)  01/09/21 218 lb 14.4 oz (99.3 kg)  07/12/20 218 lb 9.6 oz (99.2 kg)    Physical Exam: BP (!) 197/81   Pulse 66   Ht 6\' 2"  (1.88 m)   Wt 219 lb (99.3 kg)   SpO2 97%   BMI 28.12 kg/m  Physical examination performed in conjunction with Dr. Ellyn Hack.  Findings confirmed prior to annotation. Physical Exam Vitals reviewed.  Constitutional:      General: He is not in acute distress.    Appearance: Normal appearance. He is not ill-appearing or  toxic-appearing.  Cardiovascular:     Rate and Rhythm: Normal rate and regular rhythm.     Pulses: Normal pulses.     Heart sounds: No murmur heard.   No friction rub. No gallop.  Pulmonary:     Effort: Pulmonary effort is normal. No respiratory distress.     Breath sounds: Normal breath sounds. No stridor. No wheezing or rales.  Musculoskeletal:     Right lower leg: No edema.     Left lower leg: No edema.  Skin:    General: Skin is warm and dry.     Capillary Refill: Capillary refill takes less than 2 seconds.  Neurological:     Mental Status: He is alert.   Recent Labs:    Lab Results  Component Value Date   CHOL 165 12/14/2020   HDL 50 12/14/2020   LDLCALC 102 (H) 12/14/2020   TRIG 66 12/14/2020   CHOLHDL 3.3 12/14/2020   Lab Results  Component Value Date   CREATININE 1.27 07/27/2020   BUN 18 07/27/2020   NA 139 07/27/2020   K 5.3 (H) 07/27/2020   CL 103 07/27/2020   CO2 19 (L) 07/27/2020   CBC Latest Ref Rng & Units 03/02/2016 10/10/2015 04/09/2012  WBC 4.0 - 10.5 K/uL 6.1 - 6.7  Hemoglobin 13.0 - 17.0 g/dL 12.5(L) 13.3 13.0  Hematocrit  39.0 - 52.0 % 38.0(L) 39.0 37.5(L)  Platelets 150 - 400 K/uL 145(L) - 178    Lab Results  Component Value Date   HGBA1C 7.3 (H) 03/08/2016   No results found for: TSH  ==================================================  COVID-19 Education: The signs and symptoms of COVID-19 were discussed with the patient and how to seek care for testing (follow up with PCP or arrange E-visit).    I spent a total of 26 minutes with the patient spent in direct patient consultation.  Additional time spent with chart review  / charting (studies, outside notes, etc): 24 min Total Time: 50 min  Current medicines are reviewed at length with the patient today.  (+/- concerns)   This visit occurred during the SARS-CoV-2 public health emergency.  Safety protocols were in place, including screening questions prior to the visit, additional usage of  staff PPE, and extensive cleaning of exam room while observing appropriate contact time as indicated for disinfecting solutions.  Notice: This dictation was prepared with Dragon dictation along with smart phrase technology. Any transcriptional errors that result from this process are unintentional and may not be corrected upon review.  Studies Ordered:   Orders Placed This Encounter  Procedures   Basic metabolic panel   AMB Referral to [facility]   Cardiac Stress Test: Informed Consent Details: Physician/Practitioner Attestation; Transcribe to consent form and obtain patient signature   MYOCARDIAL PERFUSION IMAGING   Shared Decision Making/Informed Consent {The risks [chest pain, shortness of breath, cardiac arrhythmias, dizziness, blood pressure fluctuations, myocardial infarction, stroke/transient ischemic attack, nausea, vomiting, allergic reaction, radiation exposure, metallic taste sensation and life-threatening complications (estimated to be 1 in 10,000)], benefits (risk stratification, diagnosing coronary artery disease, treatment guidance) and alternatives of a nuclear stress test were discussed in detail with Mr. Keesling and he agrees to proceed.    Patient Instructions / Medication Changes & Studies & Tests Ordered   Patient Instructions  Medication Instructions:     Increase  Amlodipine  10 mg  one  tablet    Stop  HCTZ  Start take Chlorthalidone 50 mg  one tablet    *If you need a refill on your cardiac medications before your next appointment, please call your pharmacy*   Lab Work:  [facility]  -  Dec/Jan  2023 If you have labs (blood work) drawn today and your tests are completely normal, you will receive your results only by: [place] (if you have MyChart) OR A paper copy in the mail If you have any lab test that is abnormal or we need to change your treatment, we will call you to review the results.   Testing/Procedures:  In March 2023   at [facility] has requested that you have a lexiscan myoview.Please follow instruction sheet, as given.    Follow-Up: At [facility], you and your health needs are our priority.  As part of our continuing mission to provide you with exceptional heart care, we have created designated Provider Care Teams.  These Care Teams include your primary Cardiologist (physician) and Advanced Practice Providers (APPs -  Physician Assistants and Nurse Practitioners) who all work together to provide you with the care you need, when you need it.  We recommend signing up for the patient portal called "MyChart".  Sign up information is provided on this After Visit Summary.  MyChart is used to connect with patients for Virtual Visits (Telemedicine).  Patients are able to view lab/test results, encounter  notes, upcoming appointments, etc.  Non-urgent messages can be sent to your provider as well.   To learn more about what you can do with MyChart, go to [email or web address].    Your next appointment:   4 month(s) March 2023  The format for your next appointment:   In Person  Provider:   Glenetta Hew, MD     Other Instructions  Your physician recommends that you schedule a follow-up appointment in:  CV[facility] in  late Dec 2022 or Jan 2023     Signed:   Donney Dice, DO - R2, Fam Med   ATTENDING ATTESTATION  I have seen, examined and evaluated the patient along with the Resident Physician in clinic today.  I personally performed my own interview & exanimation.  After reviewing all the available data and chart, we discussed the patients laboratory, study & physical findings as well as symptoms in detail. I agree with her findings, examination as well as impression recommendations as per our discussion.    Attending adjustments int the full clinic noted annotated in [place].     Glenetta Hew, M.D., M.S. Interventional Cardiologist   Pager # [phone] Phone #  [phone] [address] [place], [place] [address]     Thank you for choosing [facility] at [facility]!!

## 2021-01-09 NOTE — Assessment & Plan Note (Addendum)
Most recent Myoview in 2018 was clearly abnormal.  Findings are consistent with known coronary anatomy.  There is no evidence of true ischemia besides what would already be known.  Plan will be to recheck Lexiscan Myoview 5 years out which would be March 2023.  Would check Lexiscan not treadmill Myoview -> he is not able to walk on a treadmill for prolonged period time because his back, but also EKG changes would be read as abnormal.

## 2021-01-09 NOTE — Assessment & Plan Note (Addendum)
Hyperlipidemia - with DM-2 -continue atorvastatin (since he never converted to rosuvastatin) -given not at goal, will Refer to [facility] run by [place] Pharmacists, patient may] benefit from PCSK9-Inibitior -(Last A1c 7.0) consider SGLT-2+/- GLP1 agonist for DM-2

## 2021-01-09 NOTE — Assessment & Plan Note (Signed)
-  uncontrolled, BP 197/81 -discontinue HCTZ, start chlorthalidone 50 mg  -increased to amlodipine 10 mg -continue spironolactone, carvedilol and telmisartan  -recheck BP at follow up with pharmacist ([facility]-Cardiovascular Risk Reduction Clinic run by our clinical pharmacist)

## 2021-01-09 NOTE — Progress Notes (Signed)
History of Present Illness:  Patient is a 80 y.o. year old male who presents for evaluation of carotid stenosis.  He was referred by Dr. Ellyn Hack after being dizzy with syncopal episodes.  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    The patient denies symptoms of TIA, amaurosis, or stroke.  He is medically managed with Statin, Plavix, antihypertensives and DM control.  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 Ellyn Hack (Cardiologist): For Abnormal ST: CABG X 5; cath 03/2012 --> LIMA-LAD - patent, SVG-Ramus - anastamotic 80% - DES stent; SVG-RCA & SVG-OM1-OM2-- occluded; DES Stent to Cx-OM1   Cardiomyopathy, ischemic- moderate LVD (EF of roughly 45%) at cath 2/143 11/03/2012   Resolved - f/u Echo with EF 55-60%   Carotid artery narrowings 03/2018   Moderate L ICA 40-59%   Carpal tunnel syndrome on right    Chronic back pain    spinal stenosis and scoliosis   DDD (degenerative disc disease), lumbosacral    Diabetes type 2, controlled ([place])    takes Metformin once a wk   DJD (degenerative joint disease) 9/12   Rt TKR   Dyslipidemia    takes Simvastatin daily   First degree heart block    GERD (gastroesophageal reflux disease)    History of colon polyps    benign   History of shingles    forehead --  dec 2016--  no residual pain   Osteoarthritis    Peripheral arterial disease ([place]) May 2016  (seen by dr berry)   L. ABI 0.58 with high grade L. SFA stenosis and occluded L. DP. R. ABI 0.48 with occluded R. SFA and occluded R. DP.   Pneumonia    hx of-as a child   Resistant hypertension    takes Coreg,HCTZ,and Micardis daily   Weakness    numbness and tingling    Past Surgical History:  Procedure Laterality Date   Carotid Dopplers  03/2018    Normal LV size and function.  EF 55 to 60%.  No R WMA.  Impaired relaxation (GR 1 DD).  Moderate LA dilation.  Moderate aortic thickening and  calcification-sclerosis with no stenosis.   CARPAL TUNNEL RELEASE Right 10/10/2015   Procedure: RIGHT HAND CARPAL TUNNEL RELEASE;  Surgeon: Iran Planas, MD;  Location: [facility];  Service: Orthopedics;  Laterality: Right;   COLONOSCOPY     CORONARY ANGIOPLASTY     3 stents   CORONARY ARTERY BYPASS GRAFT  05/22/2010  dr Lucianne Lei tright   x 5 (LIMA-LAD, SVG- RI,. SVG-OM1-OM2, SVG-dRCA)   DECOMPRESSIVE LUMBAR LAMINECTOMY LEVEL 1 N/A 03/08/2016   Procedure: Lumbar 3-5 decompression with in situ fusion;  Surgeon: Melina Schools, MD;  Location: [facility];  Service: Orthopedics;  Laterality: N/A;   LEFT HEART CATH AND CORONARY ANGIOGRAPHY  05/15/2010   abnormal stress test-- 3 vessel CAD/  LVEDP=90mmHg  (Dr. Debara Pickett for Dr,. Ellyn Hack)    LEFT HEART CATHETERIZATION WITH CORONARY ANGIOGRAM N/A 04/02/2012   Procedure: LEFT HEART CATHETERIZATION WITH CORONARY ANGIOGRAM;  Surgeon: Leonie Man, MD;  Location: [facility] CATH LAB: Occluded SVG-rPDA & native RCA, 100% SVG-OM. Severe anastomotic lesion in SVG-RI.  Patent LIMA-LAD. Severe mid Cx-OM   NM MYOVIEW LTD  03/25/2012   Dr. Ellyn Hack: High Risk exercise nuclear study w/ scar in the apex and upper mid to basal inferior wall w/ signgicnat additional iscemia inferiorly to lateral-inferiorly from  apex to base/  severe LV dysfunction w/ infero-apical dyskinesis and hypocontractility in the inferior and lateral wall, ef 33%   NM MYOVIEW LTD  04/2016   large-size, moderate intensity reversible perfusion defect in the inferior wall suggestive of ischemia. EF 34%. Global hypokinesis and severe inferior hypokinesis to akinesis. Also noted horizontal ST segment depression in inferior-lateralleads. HIGH RISK   PERCUTANEOUS CORONARY STENT INTERVENTION (PCI-S) N/A 04/08/2012   Procedure: PERCUTANEOUS CORONARY STENT INTERVENTION (PCI-S);  Surgeon: Leonie Man, MD;  Location: [facility] CATH LAB: Promus Premier DES 2.25 mm x 12 mm - Mid Cx-OM1; Promus Premier DES 2.5 mm x 16 mm  -- anastomotic SVG-RI (graft into native)   TOTAL KNEE ARTHROPLASTY Right 10/26/2010   TRANSTHORACIC ECHOCARDIOGRAM  08/21/2010   mild LVH, EF 45-50%, inferior hypokinesis,  grade 1 diastolic dysfunction/  moderate LAE/   mild MR/  trivial TR/  mild dilated RV   TRANSTHORACIC ECHOCARDIOGRAM  03/2018    Normal LV size and function.  EF 55 to 60%.  No R WMA.  Impaired relaxation (GR 1 DD).  Moderate LA dilation.  Moderate aortic thickening and calcification-sclerosis with no stenosis.   UMBILICAL HERNIA REPAIR  1980's     Social History Social History   Tobacco Use   Smoking status: Former    Types: Cigarettes    Quit date: 09/26/1980    Years since quitting: 40.3   Smokeless tobacco: Never  Vaping Use   Vaping Use: Never used  Substance Use Topics   Alcohol use: No   Drug use: No    Family History Family History  Problem Relation Age of Onset   Cancer Mother     Allergies  Allergies  Allergen Reactions   No Known Allergies      Current Outpatient Medications  Medication Sig Dispense Refill   amLODipine (NORVASC) 5 MG tablet Take 1 tablet (5 mg total) by mouth daily. 90 tablet 3   atorvastatin (LIPITOR) 40 MG tablet Take 1 tablet (40 mg total) by mouth daily. 90 tablet 3   carvedilol (COREG) 25 MG tablet TAKE 1 TABLET (25 MG TOTAL) BY MOUTH 2 (TWO) TIMES DAILY. 180 tablet 1   clopidogrel (PLAVIX) 75 MG tablet TAKE 1 TABLET BY MOUTH EVERY DAY 90 tablet 3   fish oil-omega-3 fatty acids 1000 MG capsule Take 1 g by mouth daily.      hydrochlorothiazide (HYDRODIURIL) 25 MG tablet TAKE 1 TABLET BY MOUTH EVERY DAY 90 tablet 3   metFORMIN (GLUCOPHAGE) 500 MG tablet Take 500 mg by mouth daily with breakfast.     nitroGLYCERIN (NITROSTAT) 0.4 MG SL tablet Place 1 tablet (0.4 mg total) under the tongue every 5 (five) minutes as needed for chest pain. 25 tablet 2   telmisartan (MICARDIS) 80 MG tablet TAKE 2 TABLETS BY MOUTH EVERY DAY 180 tablet 2   triamcinolone cream (KENALOG)  0.1 % EVERY DAY     No current facility-administered medications for this visit.    ROS:   General:  No weight loss, Fever, chills  HEENT: No recent headaches, no nasal bleeding, no visual changes, no sore throat  Neurologic: No dizziness, blackouts, seizures. No recent symptoms of stroke or mini- stroke. No recent episodes of slurred speech, or temporary blindness.  Cardiac: No recent episodes of chest pain/pressure, no shortness of breath at rest.  No shortness of breath with exertion.  Denies history of atrial fibrillation or irregular heartbeat  Vascular: No history of rest pain in feet.  No  history of claudication.  No history of non-healing ulcer, No history of DVT   Pulmonary: No home oxygen, no productive cough, no hemoptysis,  No asthma or wheezing  Musculoskeletal:  [ ]  Arthritis, [ x] Low back pain,  [x ] Joint pain  Hematologic:No history of hypercoagulable state.  No history of easy bleeding.  No history of anemia  Gastrointestinal: No hematochezia or melena,  No gastroesophageal reflux, no trouble swallowing  Urinary: [ ]  chronic Kidney disease, [ ]  on HD - [ ]  MWF or [ ]  TTHS, [ ]  Burning with urination, [ ]  Frequent urination, [ ]  Difficulty urinating;   Skin: No rashes  Psychological: No history of anxiety,  No history of depression   Physical Examination  Vitals:   01/09/21 1305 01/09/21 1307  BP: (!) 156/71 (!) 162/74  Pulse: 64   Resp: 20   Temp: 97.9 F (36.6 C)   TempSrc: Temporal   SpO2: 100%   Weight: 218 lb 14.4 oz (99.3 kg)   Height: 6\' 2"  (1.88 m)     Body mass index is 28.11 kg/m.  General:  Alert and oriented, no acute distress HEENT: Normal Neck: No bruit or JVD Pulmonary: Clear to auscultation bilaterally Cardiac: Regular Rate and Rhythm without murmur Gastrointestinal: Soft, non-tender, non-distended, no mass, no scars Skin: No rash Extremity Pulses:  2+ radial Musculoskeletal: No deformity or edema  Neurologic: Upper and  lower extremity motor 5/5 and symmetric  DATA:  Right Carotid Findings:  +----------+--------+--------+--------+----------------------+--------+            PSV cm/sEDV cm/sStenosisPlaque Description    Comments  +----------+--------+--------+--------+----------------------+--------+  CCA Prox  58      4                                               +----------+--------+--------+--------+----------------------+--------+  CCA Distal84      12                                              +----------+--------+--------+--------+----------------------+--------+  ICA Prox  60      15              focal and heterogenous          +----------+--------+--------+--------+----------------------+--------+  ICA Mid   70      18      1-39%                                   +----------+--------+--------+--------+----------------------+--------+  ICA Distal66      24                                              +----------+--------+--------+--------+----------------------+--------+  ECA       141     1                                               +----------+--------+--------+--------+----------------------+--------+   +----------+--------+-------+----------------+-------------------+  PSV cm/sEDV cmsDescribe        Arm Pressure (mmHG)  +----------+--------+-------+----------------+-------------------+  Subclavian129            Multiphasic, WNL                     +----------+--------+-------+----------------+-------------------+   +---------+--------+--+--------+-+----------------------------+  VertebralPSV cm/s51EDV cm/s0High resistant and Antegrade  +---------+--------+--+--------+-+----------------------------+       Left Carotid Findings:  +----------+--------+--------+--------+------------------+-----------------  ----+            PSV cm/sEDV cm/sStenosisPlaque DescriptionComments                 +----------+--------+--------+--------+------------------+-----------------  ----+  CCA Prox  48      6                                                         +----------+--------+--------+--------+------------------+-----------------  ----+  CCA Distal36      10                                                        +----------+--------+--------+--------+------------------+-----------------  ----+  ICA Prox  128     39              heterogenous                              +----------+--------+--------+--------+------------------+-----------------  ----+  ICA Mid   169     38      40-59%  heterogenous      based on PSV,  plaque,                                                      turbulence              +----------+--------+--------+--------+------------------+-----------------  ----+  ICA Distal118     30                                                        +----------+--------+--------+--------+------------------+-----------------  ----+  ECA       72      14                                                        +----------+--------+--------+--------+------------------+-----------------  ----+   +----------+--------+--------+----------------+-------------------+            PSV cm/sEDV cm/sDescribe        Arm Pressure (mmHG)  +----------+--------+--------+----------------+-------------------+  [id number]             Multiphasic, WNL                     +----------+--------+--------+----------------+-------------------+   +---------+--------+--+--------+-+--------------------------+  VertebralPSV cm/s10EDV cm/s0Minimal reconstituted flow  +---------+--------+--+--------+-+--------------------------+    Summary:  Right Carotid: Velocities in the right ICA are consistent with a 1-39%  stenosis.   Left Carotid: Velocities in the left ICA are consistent with a 40-59%  stenosis.   Vertebrals:   Right vertebral artery demonstrates high resistant, antegrade               flow.               Left vertebral artery demonstrates minimal reconsituted flow.  Subclavians: Normal flow hemodynamics were seen in bilateral subclavian               arteries.   ASSESSMENT/PLAN: Asymptomatic carotid stenosis The carotid duplex is accentually unchanged from 1 year ago.   Right Carotid: Velocities in the right ICA are consistent with a 1-39%  stenosis.   Left Carotid: Velocities in the left ICA are consistent with a 40-59%  stenosis.   He remains asymptomatic of stroke/TIA.  He stays active.  If he develops symptoms of stroke/TIA he will call 911.  Other wise he will f/u for repeat carotid duplex in 1 year.        Roxy Horseman PA-C [facility] Office: [phone]  MD in clinic [place]

## 2021-01-09 NOTE — Assessment & Plan Note (Signed)
EF improved back to baseline normal levels.  No active heart failure symptoms.  Plan: Continue current dose of carvedilol and Micardis. Increase amlodipine to 10 mg daily, converting from HCTZ to chlorthalidone 50 mg daily.

## 2021-01-09 NOTE — Assessment & Plan Note (Addendum)
Significant native CAD, completely asymptomatic at the time of his initial evaluation for preop knee surgery.  Was found to have multivessel disease sent for CABG.  He then had graft disease requiring PCI again with no symptoms.  Found to abnormal Myoview.  Continues to be stable without any active angina or heart failure symptoms on stable meds.  Plan:  Continue current dose of carvedilol and telmisartan.  Increasing amlodipine to 10 mg for additional antianginal benefit and blood pressure control.  Continue statin for now, but anticipate converting either to a different statin or potentially adding PCSK9 inhibitor-referring to [facility] lipid clinic based on recent labs.  Will be 5 yrs out from last [place] in March 2023 --> with him essentially having asymptomatic ischemic CAD, will check Myoview stress test in March 2023, prior to follow-up.[name]

## 2021-01-09 NOTE — Progress Notes (Signed)
ATTENDING ATTESTATION  I have seen, examined and evaluated the patient along with the Resident Physician in clinic today.  I personally performed my own interview & exanimation.  After reviewing all the available data and chart, we discussed the patients laboratory, study & physical findings as well as symptoms in detail. I agree with her findings, examination as well as impression recommendations as per our discussion.    Attending adjustments int the full clinic noted annotated in [place].   Problem List Items Addressed This Visit       Cardiology Problems   CAD, CABG X 18 May 2010, progression at cath 04/02/12 -- status post PCI to SVG-RI & native Cx-OM. (Chronic)    Significant native CAD, completely asymptomatic at the time of his initial evaluation for preop knee surgery.  Was found to have multivessel disease sent for CABG.  He then had graft disease requiring PCI again with no symptoms.  Found to abnormal Myoview.  Continues to be stable without any active angina or heart failure symptoms on stable meds.  Plan: Continue current dose of carvedilol and telmisartan. Increasing amlodipine to 10 mg for additional antianginal benefit and blood pressure control. Continue statin for now, but anticipate converting either to a different statin or potentially adding PCSK9 inhibitor-referring to [facility] lipid clinic based on recent labs. Will be 5 yrs out from last [place] in March 2023 --> with him essentially having asymptomatic ischemic CAD, will check Myoview stress test in March 2023, prior to follow-up..      Relevant Medications   amLODipine (NORVASC) 10 MG tablet   chlorthalidone (HYGROTON) 50 MG tablet   Other Relevant Orders   Cardiac Stress Test: Informed Consent Details: Physician/Practitioner Attestation; Transcribe to consent form and obtain patient signature   AMB Referral to [facility] Pharm-D   MYOCARDIAL PERFUSION IMAGING   Atherosclerotic heart disease of artery  bypass graft - occluded SVG-RCA and occluded SVG-OM. Status post PCI to SVG-RI (Chronic)    He has clear graft disease.  Not notable symptoms.  Due for Myoview stress test follow-up in March 2023.  This was 5 years out.  Based on the fact that he has no symptoms, this is our only way of finding out if he has progression of disease.      Relevant Medications   amLODipine (NORVASC) 10 MG tablet   chlorthalidone (HYGROTON) 50 MG tablet   Resistant hypertension - Primary (Chronic)    -uncontrolled, BP 197/81 -discontinue HCTZ, start chlorthalidone 50 mg  -increased to amlodipine 10 mg -continue spironolactone, carvedilol and telmisartan  -recheck BP at follow up with pharmacist ([facility] run by our clinical pharmacist)      Relevant Medications   amLODipine (NORVASC) 10 MG tablet   chlorthalidone (HYGROTON) 50 MG tablet   Other Relevant Orders   Cardiac Stress Test: Informed Consent Details: Physician/Practitioner Attestation; Transcribe to consent form and obtain patient signature   AMB Referral to [place] metabolic panel   Cardiomyopathy, ischemic-EF improved from 45% post CABG up to 55-60% (Chronic)    EF improved back to baseline normal levels.  No active heart failure symptoms.  Plan: Continue current dose of carvedilol and Micardis. Increase amlodipine to 10 mg daily, converting from HCTZ to chlorthalidone 50 mg daily.      Relevant Medications   amLODipine (NORVASC) 10 MG tablet   chlorthalidone (HYGROTON) 50 MG tablet   Other Relevant Orders   Cardiac  Stress Test: Informed Consent Details: Physician/Practitioner Attestation; Transcribe to consent form and obtain patient signature   AMB Referral to [place] metabolic panel     Other   S/P elective  PTCA / DES to SVG-RI and OM1 04/08/12 (Chronic)   Relevant Orders   Cardiac Stress Test:  Informed Consent Details: Physician/Practitioner Attestation; Transcribe to consent form and obtain patient signature   AMB Referral to [facility]   Dyslipidemia associated with type 2 diabetes mellitus ([place]) (Chronic)    Hyperlipidemia - with DM-2 -continue atorvastatin (since he never converted to rosuvastatin) -given not at goal, will Refer to [facility] run by [facility], patient may] benefit from PCSK9-Inibitior -(Last A1c 7.0) consider SGLT-2+/- GLP1 agonist for DM-2       Relevant Orders   Cardiac Stress Test: Informed Consent Details: Physician/Practitioner Attestation; Transcribe to consent form and obtain patient signature   Abnormal nuclear stress test (Chronic)    Most recent Myoview in 2018 was clearly abnormal.  Findings are consistent with known coronary anatomy.  There is no evidence of true ischemia besides what would already be known.  Plan will be to recheck Lexiscan Myoview 5 years out which would be March 2023.  Would check Lexiscan not treadmill Myoview -> he is not able to walk on a treadmill for prolonged period time because his back, but also EKG changes would be read as abnormal.      Relevant Orders   Cardiac Stress Test: Informed Consent Details: Physician/Practitioner Attestation; Transcribe to consent form and obtain patient signature   AMB Referral to [facility] Pharm-D   MYOCARDIAL PERFUSION IMAGING   Basic metabolic panel       Glenetta Hew, M.D., M.S. Interventional Cardiologist   Pager # [phone] Phone # [phone] [address] [place], [place] [address]

## 2021-01-09 NOTE — Patient Instructions (Signed)
Medication Instructions:     Increase  Amlodipine  10 mg  one  tablet    Stop  HCTZ  Start take Chlorthalidone 50 mg  one tablet    *If you need a refill on your cardiac medications before your next appointment, please call your pharmacy*   Lab Work:  [facility]  -  Dec/Jan  2023 If you have labs (blood work) drawn today and your tests are completely normal, you will receive your results only by: [place] (if you have MyChart) OR A paper copy in the mail If you have any lab test that is abnormal or we need to change your treatment, we will call you to review the results.   Testing/Procedures:  In March 2023   at [place] has requested that you have a lexiscan myoview.Please follow instruction sheet, as given.    Follow-Up: At [facility], you and your health needs are our priority.  As part of our continuing mission to provide you with exceptional heart care, we have created designated Provider Care Teams.  These Care Teams include your primary Cardiologist (physician) and Advanced Practice Providers (APPs -  Physician Assistants and Nurse Practitioners) who all work together to provide you with the care you need, when you need it.  We recommend signing up for the patient portal called "MyChart".  Sign up information is provided on this After Visit Summary.  MyChart is used to connect with patients for Virtual Visits (Telemedicine).  Patients are able to view lab/test results, encounter notes, upcoming appointments, etc.  Non-urgent messages can be sent to your provider as well.   To learn more about what you can do with MyChart, go to [email or web address].    Your next appointment:   4 month(s) March 2023  The format for your next appointment:   In Person  Provider:   Glenetta Hew, MD     Other Instructions  Your physician recommends that you schedule a follow-up appointment in:  [facility] in  late Dec 2022 or Jan 2023

## 2021-01-20 ENCOUNTER — Other Ambulatory Visit: Payer: Self-pay

## 2021-01-20 ENCOUNTER — Ambulatory Visit: Payer: Medicare Other | Admitting: Pharmacist Clinician (PhC)/ Clinical Pharmacy Specialist

## 2021-01-20 DIAGNOSIS — I10 Essential (primary) hypertension: Secondary | ICD-10-CM

## 2021-01-20 MED ORDER — TELMISARTAN 80 MG PO TABS: 80.0000 mg | ORAL_TABLET | Freq: Every day | ORAL | 3 refills | Status: DC

## 2021-01-20 MED ORDER — CLOPIDOGREL BISULFATE 75 MG PO TABS: 75.0000 mg | ORAL_TABLET | Freq: Every day | ORAL | 3 refills | Status: DC

## 2021-01-20 NOTE — Progress Notes (Signed)
01/25/2021 [name] 13-Jan-1941 [id number]   HPI:  [name] is a 80 y.o. male patient of Dr Ellyn Hack, with a PMH below who presents today for hypertension clinic evaluation.  He was last seen just 2 weeks ago, at which time his blood pressure in the office was noted to be 197/81.  Amlodipine was increased to 10 mg, and hctz was switched to chlorthalidone 50 mg.    Today patient returns to the office for hypertension evaluation, with his wife.  He has been checking his pressure at home, which is reading much lower than that visit with Dr. Ellyn Hack.   States compliance with medications and dose times, and reports no side effects from his meds.    Past Medical History: ASCVD CABG x 5 2012, PCI to SVG-RI and native Cx-OM1 2014  hyperlipidemia 11/22 LDL 102 - on  CHF EF improved to 55-60% after CABG  DM2 7/22 A1c 7.1 on metformin 500 mg qd     Blood Pressure Goal:  130/80  Current Medications: amlodipine 10 mg qd, chlorthalidone 50 mg qd, telmisartan 160 mg qd, carvedilol 25 mg bid  Social Hx: no tobacco, no alcohol,  Mt. Dew/gatorade about 6 ounces daily  Diet: mix of home and eat out, no fast foods; mix of vegetables fresh/frozen; snacks regularly  Exercise: [employer] 2-3 per week, treadmill 20 minutes then machines for another while  Home BP readings: has last 9 days readings, average 129/58 (range 107-152/50-62)  Intolerances: nkda  Labs: 6/22: Na 139, K 5.3, Glu 105, BUN 18, SCr 1.27, GFR 58   Wt Readings from Last 3 Encounters:  01/09/21 219 lb (99.3 kg)  01/09/21 218 lb 14.4 oz (99.3 kg)  07/12/20 218 lb 9.6 oz (99.2 kg)   BP Readings from Last 3 Encounters:  01/25/21 136/78  01/09/21 (!) 197/81  01/09/21 (!) 162/74   Pulse Readings from Last 3 Encounters:  01/25/21 64  01/09/21 66  01/09/21 64    Current Outpatient Medications  Medication Sig Dispense Refill   amLODipine (NORVASC) 10 MG tablet Take 1 tablet (10 mg total) by mouth daily. 180  tablet 3   atorvastatin (LIPITOR) 40 MG tablet Take 1 tablet (40 mg total) by mouth daily. 90 tablet 3   carvedilol (COREG) 25 MG tablet TAKE 1 TABLET (25 MG TOTAL) BY MOUTH 2 (TWO) TIMES DAILY. 180 tablet 1   chlorthalidone (HYGROTON) 50 MG tablet Take 1 tablet (50 mg total) by mouth daily. 90 tablet 3   Cholecalciferol (VITAMIN D3) 50 MCG (2000 UT) CAPS Take 2,000 Units by mouth daily.     fish oil-omega-3 fatty acids 1000 MG capsule Take 1 g by mouth daily.      metFORMIN (GLUCOPHAGE) 500 MG tablet Take 500 mg by mouth daily with breakfast.     nitroGLYCERIN (NITROSTAT) 0.4 MG SL tablet Place 1 tablet (0.4 mg total) under the tongue every 5 (five) minutes as needed for chest pain. 25 tablet 2   telmisartan (MICARDIS) 80 MG tablet Take 1 tablet (80 mg total) by mouth daily. 90 tablet 3   triamcinolone cream (KENALOG) 0.1 % EVERY DAY     clopidogrel (PLAVIX) 75 MG tablet Take 1 tablet (75 mg total) by mouth daily. 90 tablet 3   No current facility-administered medications for this visit.    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 Ellyn Hack (Cardiologist): For Abnormal ST: CABG X 5; cath 03/2012 --> LIMA-LAD - patent, SVG-Ramus - anastamotic 80% - DES stent; SVG-RCA & SVG-OM1-OM2-- occluded; DES Stent to Cx-OM1   Cardiomyopathy, ischemic- moderate LVD (EF of roughly 45%) at cath 2/143 11/03/2012   Resolved - f/u Echo with EF 55-60%   Carotid artery narrowings 03/2018   Moderate L ICA 40-59%   Carpal tunnel syndrome on right    Chronic back pain    spinal stenosis and scoliosis   DDD (degenerative disc disease), lumbosacral    Diabetes type 2, controlled ([place])    takes Metformin once a wk   DJD (degenerative joint disease) 9/12   Rt TKR   Dyslipidemia    takes Simvastatin daily   First degree heart block    GERD (gastroesophageal reflux disease)    History of colon polyps    benign    History of shingles    forehead --  dec 2016--  no residual pain   Osteoarthritis    Peripheral arterial disease ([place]) May 2016  (seen by dr berry)   L. ABI 0.58 with high grade L. SFA stenosis and occluded L. DP. R. ABI 0.48 with occluded R. SFA and occluded R. DP.   Pneumonia    hx of-as a child   Resistant hypertension    takes Coreg,HCTZ,and Micardis daily   Weakness    numbness and tingling    Blood pressure 136/78, pulse 64.  Resistant hypertension Patient with presumed resistant hypertension, doing better in the office today.  Interestingly his home cuff shows much better readings than past office.  Currently he takes 160 mg of telmisartan, which is double the effective maximum dose.  Will cut that back to 80 mg daily today and have him continue with all other medications.  Will have him continue with home BP monitoring, suspect that he may have some aspect of white coat hypertension.  He will return in 6 weeks for follow up and was told to bring his home cuff for validation.  If the home cuff is accurate, I would like to cut the chlorthalidone down to 25 mg and see if we can maintain normotensive readings.     Tommy Medal PharmD CPP [facility] [address] [place], [place] [address] [phone]

## 2021-01-20 NOTE — Patient Instructions (Signed)
Return for a a follow up appointment January 31 at 2 pm  Go to the lab in about 10-14 days - aim for any day after Christmas  Check your blood pressure at home daily and keep record of the readings.  Take your BP meds as follows:  Cut telmisartan to just 1 tablet (80 mg) daily.  Continue with all other medications  Bring all of your meds, your BP cuff and your record of home blood pressures to your next appointment.  Exercise as you're able, try to walk approximately 30 minutes per day.  Keep salt intake to a minimum, especially watch canned and prepared boxed foods.  Eat more fresh fruits and vegetables and fewer canned items.  Avoid eating in fast food restaurants.    HOW TO TAKE YOUR BLOOD PRESSURE: Rest 5 minutes before taking your blood pressure.  Don't smoke or drink caffeinated beverages for at least 30 minutes before. Take your blood pressure before (not after) you eat. Sit comfortably with your back supported and both feet on the floor (don't cross your legs). Elevate your arm to heart level on a table or a desk. Use the proper sized cuff. It should fit smoothly and snugly around your bare upper arm. There should be enough room to slip a fingertip under the cuff. The bottom edge of the cuff should be 1 inch above the crease of the elbow. Ideally, take 3 measurements at one sitting and record the average.

## 2021-01-24 NOTE — Telephone Encounter (Signed)
Appointment schedule for 01/09/21

## 2021-01-25 ENCOUNTER — Encounter: Payer: Self-pay | Admitting: Pharmacist Clinician (PhC)/ Clinical Pharmacy Specialist

## 2021-01-25 NOTE — Assessment & Plan Note (Signed)
Patient with presumed resistant hypertension, doing better in the office today.  Interestingly his home cuff shows much better readings than past office.  Currently he takes 160 mg of telmisartan, which is double the effective maximum dose.  Will cut that back to 80 mg daily today and have him continue with all other medications.  Will have him continue with home BP monitoring, suspect that he may have some aspect of white coat hypertension.  He will return in 6 weeks for follow up and was told to bring his home cuff for validation.  If the home cuff is accurate, I would like to cut the chlorthalidone down to 25 mg and see if we can maintain normotensive readings.

## 2021-01-31 LAB — BASIC METABOLIC PANEL
BUN/Creatinine Ratio: 18 (ref 10–24)
BUN: 25 mg/dL (ref 8–27)
CO2: 22 mmol/L (ref 20–29)
Calcium: 10.6 mg/dL — ABNORMAL HIGH (ref 8.6–10.2)
Chloride: 104 mmol/L (ref 96–106)
Creatinine, Ser: 1.38 mg/dL — ABNORMAL HIGH (ref 0.76–1.27)
Glucose: 93 mg/dL (ref 70–99)
Potassium: 5 mmol/L (ref 3.5–5.2)
Sodium: 139 mmol/L (ref 134–144)
eGFR: 52 mL/min/{1.73_m2} — ABNORMAL LOW (ref >59)

## 2021-02-14 ENCOUNTER — Ambulatory Visit: Payer: Medicare Other

## 2021-02-20 ENCOUNTER — Other Ambulatory Visit: Payer: Self-pay | Admitting: Cardiology

## 2021-03-14 ENCOUNTER — Other Ambulatory Visit: Payer: Self-pay

## 2021-03-14 ENCOUNTER — Ambulatory Visit: Payer: Medicare Other | Admitting: Pharmacist

## 2021-03-14 VITALS — BP 138/64 | HR 64 | Resp 15 | Ht 74.0 in | Wt 220.4 lb

## 2021-03-14 DIAGNOSIS — I10 Essential (primary) hypertension: Secondary | ICD-10-CM | POA: Diagnosis not present

## 2021-03-14 NOTE — Progress Notes (Signed)
Patient ID: [name]                 DOB: [birth year]                      MRN: [id number]     HPI: [name] is a 81 y.o. male referred by Dr. Ellyn Hack to HTN clinic. PMH is significant for CAD, HLF, CHF, and DM.  At last visit with pharmD telmisartan dose was reduced.    Patient presents today  with wife.  Reports he feels well and having no adverse effects.  Has been taking blood sugar in mornings and reports readings are consistently in 80s.  Wife takes his blood pressure in the mornings and around 4pm.    Home readings:  12/31: 144/59, 121/56 12/30: 141/57 12/29: 123/59 12/28: 127/56, 142/58 12/27: 107/50, 150/64 12/26: 115/50, 128/56 12/25: 101/52, 128/58  Patient reports his breakfast is cereal or oatmeal. Does not typically eat lunch unless its snacks.  Wife cooks dinner however patient adds salt to it even though she tells him not to.  Also drinks mountain dew mixed with gatorade.   Current HTN meds:   Amlodipine 10mg  daily Carvedilol 25mg  BID  Chrlothalidone 50mg  daily Telmisartan 80mg  daily  BP goal: <130/80   Wt Readings from Last 3 Encounters:  03/14/21 220 lb 6.4 oz (100 kg)  01/09/21 219 lb (99.3 kg)  01/09/21 218 lb 14.4 oz (99.3 kg)   BP Readings from Last 3 Encounters:  03/14/21 138/64  01/25/21 136/78  01/09/21 (!) 197/81   Pulse Readings from Last 3 Encounters:  03/14/21 64  01/25/21 64  01/09/21 66    Renal function: CrCl cannot be calculated (Patient's most recent lab result is older than the maximum 21 days allowed.).  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 Ellyn Hack (Cardiologist): For Abnormal ST: CABG X 5; cath 03/2012 --> LIMA-LAD - patent, SVG-Ramus - anastamotic 80% - DES stent; SVG-RCA & SVG-OM1-OM2-- occluded; DES Stent to Cx-OM1   Cardiomyopathy, ischemic- moderate LVD (EF of roughly 45%) at cath 2/143 11/03/2012   Resolved - f/u Echo with EF 55-60%   Carotid artery  narrowings 03/2018   Moderate L ICA 40-59%   Carpal tunnel syndrome on right    Chronic back pain    spinal stenosis and scoliosis   DDD (degenerative disc disease), lumbosacral    Diabetes type 2, controlled ([place])    takes Metformin once a wk   DJD (degenerative joint disease) 9/12   Rt TKR   Dyslipidemia    takes Simvastatin daily   First degree heart block    GERD (gastroesophageal reflux disease)    History of colon polyps    benign   History of shingles    forehead --  dec 2016--  no residual pain   Osteoarthritis    Peripheral arterial disease ([place]) May 2016  (seen by dr berry)   L. ABI 0.58 with high grade L. SFA stenosis and occluded L. DP. R. ABI 0.48 with occluded R. SFA and occluded R. DP.   Pneumonia    hx of-as a child   Resistant hypertension    takes Coreg,HCTZ,and Micardis daily   Weakness    numbness and tingling    Current Outpatient Medications on File Prior to Visit  Medication Sig Dispense Refill   amLODipine (NORVASC) 10 MG tablet Take 1 tablet (10 mg total) by mouth daily.  180 tablet 3   atorvastatin (LIPITOR) 40 MG tablet Take 1 tablet (40 mg total) by mouth daily. 90 tablet 3   carvedilol (COREG) 25 MG tablet TAKE 1 TABLET (25 MG TOTAL) BY MOUTH 2 (TWO) TIMES DAILY. 180 tablet 1   chlorthalidone (HYGROTON) 50 MG tablet Take 1 tablet (50 mg total) by mouth daily. 90 tablet 3   Cholecalciferol (VITAMIN D3) 50 MCG (2000 UT) CAPS Take 2,000 Units by mouth daily.     clopidogrel (PLAVIX) 75 MG tablet Take 1 tablet (75 mg total) by mouth daily. 90 tablet 3   fish oil-omega-3 fatty acids 1000 MG capsule Take 1 g by mouth daily.      metFORMIN (GLUCOPHAGE) 500 MG tablet Take 500 mg by mouth daily with breakfast.     nitroGLYCERIN (NITROSTAT) 0.4 MG SL tablet Place 1 tablet (0.4 mg total) under the tongue every 5 (five) minutes as needed for chest pain. 25 tablet 2   telmisartan (MICARDIS) 80 MG tablet Take 1 tablet (80 mg total) by mouth daily. 90 tablet 3    triamcinolone cream (KENALOG) 0.1 % EVERY DAY     No current facility-administered medications on file prior to visit.    Allergies  Allergen Reactions   No Known Allergies      Assessment/Plan:  1. Hypertension -  Patient BP in room today 138/64 and rechecked at 127/50 which is at goal of <130/80.  Patient tolerating meds well.   Recommended patient decrease the amount of salt and caffeine he is drinking especially in beverages such as gatorade and mountain dew.  Patient voiced understanding.  No med changes needed at this time.  Amlodipine 10mg  daily Carvedilol 25mg  twice a day  Chrlothalidone 50mg  daily Telmisartan 80mg  daily  Karren Cobble, PharmD, [place], [place], [place], [place], [place], [address] Phone: [phone], Fax: [phone]

## 2021-03-14 NOTE — Patient Instructions (Addendum)
It was nice meeting you today  We would like your blood pressure to be less than 130/80  Please continue your:   Amlodipine 10mg  daily Carvedilol 25mg  twice a day  Chrlothalidone 50mg  daily Telmisartan 80mg  daily  Try to cut down on your Gatorade and mountain dew.  They both contain salt and caffeine which can both increase your blood pressure  Please call with any questions!  Karren Cobble, PharmD, BCACP, [place], [place], [place], [place], [address] Phone: [phone], Fax: [phone]

## 2021-03-22 ENCOUNTER — Encounter: Payer: Self-pay | Admitting: Pharmacist

## 2021-03-27 ENCOUNTER — Other Ambulatory Visit: Payer: Self-pay | Admitting: Cardiology

## 2021-04-12 ENCOUNTER — Telehealth (HOSPITAL_COMMUNITY): Payer: Self-pay | Admitting: *Deleted

## 2021-04-12 NOTE — Telephone Encounter (Signed)
Patient given detailed instructions per Myocardial Perfusion Study Information Sheet for the test on 04/19/21 at 1030. Patient notified to arrive 15 minutes early and that it is imperative to arrive on time for appointment to keep from having the test rescheduled. ? If you need to cancel or reschedule your appointment, please call the office within 24 hours of your appointment. . Patient verbalized understanding.[name], [name] ? ? ?

## 2021-04-19 ENCOUNTER — Ambulatory Visit (HOSPITAL_COMMUNITY): Payer: Medicare Other | Attending: Internal Medicine

## 2021-04-19 ENCOUNTER — Other Ambulatory Visit: Payer: Self-pay

## 2021-04-19 DIAGNOSIS — I2510 Atherosclerotic heart disease of native coronary artery without angina pectoris: Secondary | ICD-10-CM | POA: Diagnosis not present

## 2021-04-19 DIAGNOSIS — I255 Ischemic cardiomyopathy: Secondary | ICD-10-CM | POA: Insufficient documentation

## 2021-04-19 DIAGNOSIS — R9439 Abnormal result of other cardiovascular function study: Secondary | ICD-10-CM | POA: Diagnosis present

## 2021-04-19 DIAGNOSIS — I10 Essential (primary) hypertension: Secondary | ICD-10-CM | POA: Insufficient documentation

## 2021-04-19 DIAGNOSIS — Z9861 Coronary angioplasty status: Secondary | ICD-10-CM | POA: Insufficient documentation

## 2021-04-19 HISTORY — PX: NM MYOVIEW LTD: HXRAD82

## 2021-04-19 LAB — MYOCARDIAL PERFUSION IMAGING
LV dias vol: 122 mL (ref 62–150)
LV sys vol: 57 mL
Nuc Stress EF: 53 %
Rest Nuclear Isotope Dose: 10.8 mCi
SDS: 1
SRS: 0
SSS: 1
ST Depression (mm): 0 mm
Stress Nuclear Isotope Dose: 31.1 mCi
TID: 0.98

## 2021-04-19 MED ORDER — TECHNETIUM TC 99M TETROFOSMIN IV KIT
31.1000 | PACK | Freq: Once | INTRAVENOUS | Status: AC | PRN
  Administered 2021-04-19: 31.1 via INTRAVENOUS
  Filled 2021-04-19: qty 32

## 2021-04-19 MED ORDER — TECHNETIUM TC 99M TETROFOSMIN IV KIT
10.8000 | PACK | Freq: Once | INTRAVENOUS | Status: AC | PRN
  Administered 2021-04-19: 10.8 via INTRAVENOUS
  Filled 2021-04-19: qty 11

## 2021-04-19 MED ORDER — REGADENOSON 0.4 MG/5ML IV SOLN
0.4000 mg | Freq: Once | INTRAVENOUS | Status: AC
  Administered 2021-04-19: 0.4 mg via INTRAVENOUS

## 2021-04-26 ENCOUNTER — Ambulatory Visit: Payer: Medicare Other | Admitting: Cardiology

## 2021-04-26 ENCOUNTER — Encounter: Payer: Self-pay | Admitting: Cardiology

## 2021-04-26 ENCOUNTER — Other Ambulatory Visit: Payer: Self-pay

## 2021-04-26 VITALS — BP 130/62 | HR 53 | Ht 74.0 in | Wt 216.8 lb

## 2021-04-26 DIAGNOSIS — E1169 Type 2 diabetes mellitus with other specified complication: Secondary | ICD-10-CM | POA: Diagnosis not present

## 2021-04-26 DIAGNOSIS — I1A0 Resistant hypertension: Secondary | ICD-10-CM

## 2021-04-26 DIAGNOSIS — I25810 Atherosclerosis of coronary artery bypass graft(s) without angina pectoris: Secondary | ICD-10-CM

## 2021-04-26 DIAGNOSIS — I10 Essential (primary) hypertension: Secondary | ICD-10-CM

## 2021-04-26 DIAGNOSIS — E785 Hyperlipidemia, unspecified: Secondary | ICD-10-CM

## 2021-04-26 DIAGNOSIS — Z95828 Presence of other vascular implants and grafts: Secondary | ICD-10-CM

## 2021-04-26 DIAGNOSIS — I2510 Atherosclerotic heart disease of native coronary artery without angina pectoris: Secondary | ICD-10-CM | POA: Diagnosis not present

## 2021-04-26 DIAGNOSIS — I6523 Occlusion and stenosis of bilateral carotid arteries: Secondary | ICD-10-CM

## 2021-04-26 DIAGNOSIS — I255 Ischemic cardiomyopathy: Secondary | ICD-10-CM | POA: Diagnosis not present

## 2021-04-26 NOTE — Progress Notes (Signed)
? ? ?Primary Care Provider: Neale Burly, MD ?Cardiologist: Glenetta Hew, MD ?Electrophysiologist: None ? ?Clinic Note: ?Chief Complaint  ?Patient presents with  ? Follow-up  ?  Test results  ? Coronary Artery Disease  ?  No angina.  Myoview results reviewed.  ? Hypertension  ?  Recent CVRR visit for BP control.  ? ?=================================== ? ?ASSESSMENT/PLAN  ? ?Problem List Items Addressed This Visit   ? ?  ? Cardiology Problems  ? CAD, CABG X 18 May 2010, progression at cath 04/02/12 -- status post PCI to SVG-RI & native Cx-OM. - Primary (Chronic)  ?  Significant native vessel CAD, but essentially asymptomatic.  Multivessel disease noted refer to CABG followed by CABG failure requiring PCI.  This was found to be a Myoview stress testing-as he has no recollection of any anginal symptoms.  Most recent Myoview chest reviewed showed no evidence of ischemia.  Low risk. ? ?Plan: ?Continue maintenance clopidogrel-Plavix ?Okay to hold Plavix 5 to 7 Days Preop for Surgeries or Procedures. ?On amlodipine and carvedilol benefit ?On atorvastatin 40 mg daily -> need to more aggressively manage. ?Plan for CVRR follow-up after lipids and April. ?On ARB for afterload reduction ? ? ?  ?  ? Relevant Orders  ? COMPLETE METABOLIC PANEL WITH GFR  ? HgB A1c  ? 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 Status post PCI to SVG-RI (Chronic)  ?  Significant native vessel disease with also graft disease.  Despite this note.  Thankfully, diabetes check showed no evidence of ischemia.  With him having no symptoms, we will need to continue screening testing. ? ?  ?  ? Resistant hypertension (Chronic)  ?  Very difficult BP to manage.  Looks good today.  We have adjusted his medications. ? ?Continue with 10 mg amlodipine, 25 mg twice daily carvedilol, 50 mg daily chlorthalidone and 80 mg daily Micardis. ? ?We will need to follow-up chemistry panel along with lipids in April. ? ?   ?  ? Relevant Orders  ? COMPLETE METABOLIC PANEL WITH GFR  ? HgB A1c  ? Cardiomyopathy, ischemic-EF improved from 45% post CABG up to 55-60% (Chronic)  ?  EF notably improved.  No active CHF symptoms. ? ?With diabetes and CAD as well with some HFpEF, would suggest Farxiga/Jardiance, but will defer to PCP. ? ?  ?  ? Carotid artery narrowings (Chronic)  ?  Stable moderate disease.  Should be due for follow-up Doppler next year. ? ?  ?  ?  ? Other  ? 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  DES PCI into the ostial occlusion of SVG-RI with known occlusion of vein graft to the RCA and OM system.  Also native vessel PCI of the LCx. => Now graft/stent dependent. ?On long-term maintenance clopidogrel/Plavix ? ?Okay to hold Plavix 5-7 days preop for surgeries or procedures. => 7 days from high risk procedures ?  ?  ? Dyslipidemia associated with type 2 diabetes mellitus (HCC) (Chronic)  ?  Hyperlipidemia does not seem to be adequately controlled.  LDL is still over 100- 115 on current dose of atorvastatin. ?Also on omega-3 fatty acids-increase to 3 g a day. ?Only on metformin for diabetes  ?. ? ?Plan is to recheck lipid panel in April -> plan for CVRR follow-up after labs-consider PCSK9 inhibitor versus inclisiran. ?Would also  consider adding Farxiga/Jardiance (SGLT2 inhibitor ) for both diabetes and CAD-HFpEF management. ?Also potentially consider GLP-1 agonist. ? ?  ?  ? Relevant Orders  ? Lipid panel  ? HgB A1c  ? ? ?=================================== ? ?HPI:   ? ?[name] is a 81 y.o. male with a PMH notable for CAD, PAD & Mild ICM with notable CRFs reviewed below who presents today for 3-4 month f/u to reassess blood pressure and discuss Myoview results. ?  ?CAD-CABG-PCI; (resistant) HTN/HLD, DM-2 ?Initial cardiac evaluation was for Preoperative Risk Stratification back in April 2012.-> Nuclear  stress test was grossly abnormal and cardiac cath showed MV CAD => referred for CABG. ?2 years later - Abnormal surveillance Stress Test: Return the Cath Lab: Occluded SVG -RCA & SVG-OM1-OM 2 --> PCI of native LCx-OM1 and SVG-RI ?Myoview 04/2016 - read as HIGH RISK b/c reduced LVEF -- Echo confirmed relatively Normal EF. -- checked Echo.  No ischemia - Med Rx.  ?2-D echo 03/21/2018: EF ~55-60% with GRD 1 DD.  Moderate LA dilation.  Moderate aortic thickening/sclerosis no stenosis. ?Myoview 04/19/2021: LOW RISK.  EF 53%.  Fixed inferior perfusion defect (with normal wall motion-suspect artifact/diaphragmatic attenuation). ? ?[name] was last seen on January 09, 2021 for routine follow-up.  Blood pressure was very high at 197/81. ?=> Converted from HCTZ to chlorthalidone 50 mg daily.  Increased amlodipine to 10 mg daily.  Refer to CVRR hypertension clinic. ?Follow-up Myoview ordered. ? ?Recent Hospitalizations: none ? ?He was seen by Rollen Sox, [place] in [place] [facility] hypertension clinic on 03/14/2021.  BP log ranged from 101/52 up to 144/59.  BP was 138/64 and then 127/50.  No changes made.  Told to decrease salt and caffeine ?Amlodipine 10 mg daily, carvedilol 25 twice daily, chlorthalidone 50 mg daily and telmisartan 80 mg daily. ? ?Reviewed  CV studies:   ? ?The following studies were reviewed today: (if available, images/films reviewed: From Epic Chart or Care Everywhere) ?Myoview 04/19/2021: LOW RISK.  EF 53%.  Fixed inferior perfusion defect (with normal wall motion-suspect artifact/diaphragmatic attenuation). ? ?Interval History:  ? ?[name] returns here today to discuss results of his stress test.  He was very happy to see that was read as low risk with normal EF.  He is also very happy that his blood pressure recordings here today.  As usual, Trent does not have any cardiac symptoms.  He never had symptoms despite having CABG.  He has not had any chest pain or pressure at rest exertion.  Mostly  limited by his back pain, but with what he is able to do he denies any chest pain or pressure.  No exertional dyspnea.  No heart failure symptoms of PND, orthopnea or edema.  No irregular heartbeats or palpitations.  No syncope/near syncope or TIA/'s, claudication. ? ?REVIEWED OF SYSTEMS  ? ?Review of Systems  ?Constitutional:  Negative for malaise/fatigue (Just somewhat deconditioned, limited activity by his back pain.  Tries to exercise he can with machines.) and weight loss.  ?HENT:  Negative for congestion.   ?Respiratory: Negative.    ?Cardiovascular:   ?     Per HPI  ?Gastrointestinal:  Negative for abdominal pain, blood in stool, constipation and melena.  ?Genitourinary:  Negative for flank pain and hematuria.  ?Musculoskeletal:  Positive for back pain and joint pain.  ?     > Very much limiting his activity by back pain.  Also has a arthritic joint pain.  ?Neurological:  Positive for tingling (  Sometimes has neuropathic pain down the legs.) and focal weakness (He has leg weakness when he has radiculopathy.). Negative for weakness.  ?Psychiatric/Behavioral: Negative.    ? ?I have reviewed and (if needed) personally updated the patient's problem list, medications, allergies, past medical and surgical history, social and family history.  ? ?PAST MEDICAL HISTORY  ? ?Past Medical History:  ?Diagnosis Date  ? 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 Dr. Ellyn Hack (Cardiologist): For Abnormal ST: CABG X 5; cath 03/2012 --> LIMA-LAD - patent, SVG-Ramus - anastamotic 80% - DES stent; SVG-RCA & SVG-OM1-OM2-- occluded; DES Stent to Cx-OM1  ? Cardiomyopathy, ischemic- moderate LVD (EF of roughly 45%) at cath 2/143 11/03/2012  ? Resolved - f/u Echo with EF 55-60%  ? Carotid artery narrowings 03/2018  ? Moderate L ICA 40-59%  ? Carpal tunnel syndrome on right   ? Chronic back pain   ? spinal stenosis and scoliosis  ? DDD (degenerative disc disease), lumbosacral   ? Diabetes type 2, controlled ([place])   ? takes Metformin once a wk   ? DJD (degenerative joint disease) 9/12  ? Rt TKR  ? Dyslipidemia   ? takes Simvastatin daily  ? First degree heart block   ? GERD (gastroesophageal reflux disease)   ? History of colon polyps   ? benig

## 2021-04-26 NOTE — Patient Instructions (Addendum)
Medication Instructions:  ?The current medical regimen is effective;  continue present plan and medications. ? ?*If you need a refill on your cardiac medications before your next appointment, please call your pharmacy* ? ? ?Lab Work: ?LIPID (in April) (come fasting, nothing to eat or drink, no lab appointment needed) ? ?If you have labs (blood work) drawn today and your tests are completely normal, you will receive your results only by: ?MyChart Message (if you have MyChart) OR ?A paper copy in the mail ?If you have any lab test that is abnormal or we need to change your treatment, we will call you to review the results. ? ? ?Follow-Up: ?At [facility], you and your health needs are our priority.  As part of our continuing mission to provide you with exceptional heart care, we have created designated Provider Care Teams.  These Care Teams include your primary Cardiologist (physician) and Advanced Practice Providers (APPs -  Physician Assistants and Nurse Practitioners) who all work together to provide you with the care you need, when you need it. ? ?We recommend signing up for the patient portal called "MyChart".  Sign up information is provided on this After Visit Summary.  MyChart is used to connect with patients for Virtual Visits (Telemedicine).  Patients are able to view lab/test results, encounter notes, upcoming appointments, etc.  Non-urgent messages can be sent to your provider as well.   ?To learn more about what you can do with MyChart, go to [email or web address].   ? ?Your next appointment:   ?6 month(s) ? ?The format for your next appointment:   ?In Person ? ?Provider:   ?Coletta Memos, FNP, Fabian Sharp, PA-C, Sande Rives, PA-C, Caron Presume, PA-C, Almyra Deforest, PA-C, or Diona Browner, NP    Then, Glenetta Hew, MD will plan to see you again in 12 month(s).  ? ? ?

## 2021-05-28 ENCOUNTER — Encounter: Payer: Self-pay | Admitting: Cardiology

## 2021-05-28 NOTE — Assessment & Plan Note (Signed)
Very difficult BP to manage.  Looks good today.  We have adjusted his medications. ? ?Continue with 10 mg amlodipine, 25 mg twice daily carvedilol, 50 mg daily chlorthalidone and 80 mg daily Micardis. ? ?We will need to follow-up chemistry panel along with lipids in April. ?

## 2021-05-28 NOTE — Assessment & Plan Note (Signed)
Stable moderate disease.  Should be due for follow-up Doppler next year. ?

## 2021-05-28 NOTE — Assessment & Plan Note (Signed)
Significant native vessel disease with also graft disease.  Despite this note.  Thankfully, diabetes check showed no evidence of ischemia.  With him having no symptoms, we will need to continue screening testing. ?

## 2021-05-28 NOTE — Assessment & Plan Note (Signed)
EF notably improved.  No active CHF symptoms. ? ?With diabetes and CAD as well with some HFpEF, would suggest Farxiga/Jardiance, but will defer to PCP. ?

## 2021-05-28 NOTE — Assessment & Plan Note (Signed)
Hyperlipidemia does not seem to be adequately controlled.  LDL is still over 100- 115 on current dose of atorvastatin. ?Also on omega-3 fatty acids-increase to 3 g a day. ?Only on metformin for diabetes  ?. ? ?Plan is to recheck lipid panel in April -> plan for CVRR follow-up after labs-consider PCSK9 inhibitor versus inclisiran. ?Would also consider adding Farxiga/Jardiance (SGLT2 inhibitor ) for both diabetes and CAD-HFpEF management. ?Also potentially consider GLP-1 agonist. ?

## 2021-05-28 NOTE — Assessment & Plan Note (Signed)
DES PCI into the ostial occlusion of SVG-RI with known occlusion of vein graft to the RCA and OM system.  Also native vessel PCI of the LCx. => Now graft/stent dependent. ?On long-term maintenance clopidogrel/Plavix ? ?? Okay to hold Plavix 5-7 days preop for surgeries or procedures. => 7 days from high risk procedures ?

## 2021-05-28 NOTE — Assessment & Plan Note (Addendum)
Significant native vessel CAD, but essentially asymptomatic.  Multivessel disease noted refer to CABG followed by CABG failure requiring PCI.  This was found to be a Myoview stress testing-as he has no recollection of any anginal symptoms.  Most recent Myoview chest reviewed showed no evidence of ischemia.  Low risk. ? ?Plan: ?? Continue maintenance clopidogrel-Plavix ?? Okay to hold Plavix 5 to 7 Days Preop for Surgeries or Procedures. ?? On amlodipine and carvedilol benefit ?? On atorvastatin 40 mg daily -> need to more aggressively manage. ?? Plan for CVRR follow-up after lipids and April. ?? On ARB for afterload reduction ? ? ?

## 2021-06-07 LAB — LIPID PANEL
Chol/HDL Ratio: 3.2 ratio (ref 0.0–5.0)
Cholesterol, Total: 155 mg/dL (ref 100–199)
HDL: 49 mg/dL (ref >39)
LDL Chol Calc (NIH): 100 mg/dL — ABNORMAL HIGH (ref 0–99)
Triglycerides: 23 mg/dL (ref 0–149)
VLDL Cholesterol Cal: 6 mg/dL (ref 5–40)

## 2021-06-13 ENCOUNTER — Ambulatory Visit: Payer: Medicare Other | Admitting: Pharmacist Clinician (PhC)/ Clinical Pharmacy Specialist

## 2021-06-13 ENCOUNTER — Encounter: Payer: Self-pay | Admitting: Pharmacist Clinician (PhC)/ Clinical Pharmacy Specialist

## 2021-06-13 DIAGNOSIS — E785 Hyperlipidemia, unspecified: Secondary | ICD-10-CM

## 2021-06-13 DIAGNOSIS — E1169 Type 2 diabetes mellitus with other specified complication: Secondary | ICD-10-CM

## 2021-06-13 MED ORDER — ATORVASTATIN CALCIUM 80 MG PO TABS: 80.0000 mg | ORAL_TABLET | Freq: Every day | ORAL | 3 refills | Status: DC

## 2021-06-13 NOTE — Patient Instructions (Addendum)
Your Results: ?           ? Your most recent labs Goal  ?Total Cholesterol 155 < 200  ?Triglycerides 23 < 150  ?HDL (happy/good cholesterol) 49 > 40  ?LDL (lousy/bad cholesterol 100 < 55  ? ?Medication changes: ? Increase atorvastatin to 80 mg once daily.  If you have any trouble please reach out to [place] (Kito Cuffe/Chris at [phone]),  If this does not get your cholesterol to goal, we will consider adding Repatha injections.  ? ?Lab orders: ? We want to repeat labs after 2-3 months.  We will send you a lab order to remind you once we get closer to that time.   ? ?Patient Assistance:  The [employer] foundation offers assistance to help pay for medication copays.  They will cover copays for all cholesterol lowering meds, including statins, fibrates, omega-3 oils, ezetimibe, Repatha, Praluent, Nexletol, Nexlizet.  The cards are usually good for $2,500 or 12 months, whichever comes first. ?Go to [email or web address] ?Click on ?Apply Now? ?Answer questions as to whom is applying (patient or representative) ?Your disease fund will be ?hypercholesterolemia - Medicare access? ?They will ask questions about finances and which medications you are taking for cholesterol ?When you submit, the approval is usually within minutes.  You will need to print the card information from the site ?You will need to show this information to your pharmacy, they will bill your Medicare Part D plan first -then bill [employer] --for the copay.   ?You can also call them at [phone], although the hold times can be quite long.  ? ?Thank you for choosing [facility]  ? ?

## 2021-06-13 NOTE — Progress Notes (Signed)
06/13/2021 ?[name] ?[birth year] ?[id number] ? ? ?HPI:  [name] is a 81 y.o. male patient of Dr Ellyn Hack, who presents today for a lipid clinic evaluation.  See pertinent past medical history below.   He is in the office today with his wife to discuss options for further lowering his cholesterol.   ? ?Past Medical History: ?CAD 2012 - CABG x 5; 2014 PCI to SVG-RI and native Cx-OM  ?Hypertension  Difficult to control - on amlodipine 10, carvedilol 25 bid, chlorthalidone 50, telmisartan 80  ?CHF Cardiomyopathy, ischemic; EF improved from 45% post CABG to 55-60%  ?DM2 1/23 A1c 6.5 (high 8.2 - 4 years ago) on metformin 500 only twice weekly  ? ? ?Current Medications: atorvastatin 40 mg qd ? ?Cholesterol Goals: LDL < 55 ?  ?Intolerant/previously tried:  simvastatin - not potent enough ? ?Family history: father died in in his 49's, died in his sleep; mom in her 108's breast cancer; 2 brothers deceased; 1 sister (doesn't discuss health); older daughter has controlled DM, breast cancer; younger daughter healthy  ? ?Diet: mix of home and eating out; restaurant is not fast food; lots of salads, turnip greens, grows some veggies in their garden; does like to snack; eats big meal around 4-5; ? ?Exercise:  [employer] 2-3 times per week; treadmill, then resistance w/machines, about 1 hour each visit.   ? ?Labs:  4/23:  TC 155, TG 23, HDL 49, LDL 100 ? ? ?Current Outpatient Medications  ?Medication Sig Dispense Refill  ? amLODipine (NORVASC) 10 MG tablet Take 1 tablet (10 mg total) by mouth daily. 180 tablet 3  ? atorvastatin (LIPITOR) 80 MG tablet Take 1 tablet (80 mg total) by mouth daily. 90 tablet 3  ? carvedilol (COREG) 25 MG tablet TAKE 1 TABLET (25 MG TOTAL) BY MOUTH 2 (TWO) TIMES DAILY. 180 tablet 1  ? chlorthalidone (HYGROTON) 50 MG tablet Take 1 tablet (50 mg total) by mouth daily. 90 tablet 3  ? Cholecalciferol (VITAMIN D3) 50 MCG (2000 UT) CAPS Take 2,000 Units by mouth daily.    ? clopidogrel (PLAVIX) 75 MG  tablet Take 1 tablet (75 mg total) by mouth daily. 90 tablet 3  ? fish oil-omega-3 fatty acids 1000 MG capsule Take 1 g by mouth in the morning, at noon, and at bedtime.    ? metFORMIN (GLUCOPHAGE) 500 MG tablet Take 500 mg by mouth daily with breakfast.    ? nitroGLYCERIN (NITROSTAT) 0.4 MG SL tablet Place 1 tablet (0.4 mg total) under the tongue every 5 (five) minutes as needed for chest pain. 25 tablet 2  ? telmisartan (MICARDIS) 80 MG tablet Take 1 tablet (80 mg total) by mouth daily. 90 tablet 3  ? triamcinolone cream (KENALOG) 0.1 % EVERY DAY    ? ?No current facility-administered medications for this visit.  ? ? ?Allergies  ?Allergen Reactions  ? No Known Allergies   ? ? ?Past Medical History:  ?Diagnosis Date  ? 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 Dr. Ellyn Hack (Cardiologist): For Abnormal ST: CABG X 5; cath 03/2012 --> LIMA-LAD - patent, SVG-Ramus - anastamotic 80% - DES stent; SVG-RCA & SVG-OM1-OM2-- occluded; DES Stent to Cx-OM1  ? Cardiomyopathy, ischemic- moderate LVD (EF of roughly 45%) at cath 2/143 11/03/2012  ? Resolved - f/u Echo with EF 55-60%  ? Carotid artery narrowings 03/2018  ? Moderate L ICA 40-59%  ? Carpal tunnel syndrome on right   ? Chronic back pain   ? spinal stenosis and  scoliosis  ? DDD (degenerative disc disease), lumbosacral   ? Diabetes type 2, controlled ([place])   ? takes Metformin once a wk  ? DJD (degenerative joint disease) 9/12  ? Rt TKR  ? Dyslipidemia   ? takes Simvastatin daily  ? First degree heart block   ? GERD (gastroesophageal reflux disease)   ? History of colon polyps   ? benign  ? History of shingles   ? forehead --  dec 2016--  no residual pain  ? Osteoarthritis   ? Peripheral arterial disease [facility]) May 2016  (seen by dr berry)  ? L. ABI 0.58 with high grade L. SFA stenosis and occluded L. DP. R. ABI 0.48 with occluded R. SFA and occluded R. DP.  ? Pneumonia   ? hx of-as a child  ? Resistant hypertension   ? takes Coreg,HCTZ,and Micardis daily  ? Weakness   ?  numbness and tingling  ? ? ?Blood pressure (!) 150/70, pulse 63, resp. rate 15, height '6\' 2"'$  (1.88 m), weight 221 lb (100.2 kg), SpO2 92 %. ? ? ?Dyslipidemia associated with type 2 diabetes mellitus ?Patient with elevated LDL cholesterol and history of ASCVD, not at goal on atorvastatin 40 mg.  Reviewed options for lowering LDL cholesterol, including ezetimibe, PCSK-9 inhibitors, bempedoic acid and inclisiran.  Discussed mechanisms of action, dosing, side effects and potential decreases in LDL cholesterol.  Also reviewed cost information and potential options for patient assistance.  Answered all patient questions.  Based on this information, patient would prefer to increase atorvastatin to 80 mg daily.  We will do this for 3 months to see if he can get to goal on maximum dose.  If he cannot achieve an LDL < 55, we will then add Repatha 140 mg.   ? ? ?Tommy Medal PharmD CPP [facility] ?[place] ?[address] ?[place], [place] [address] [phone] ? ? ? ?

## 2021-06-13 NOTE — Assessment & Plan Note (Signed)
Patient with elevated LDL cholesterol and history of ASCVD, not at goal on atorvastatin 40 mg.  Reviewed options for lowering LDL cholesterol, including ezetimibe, PCSK-9 inhibitors, bempedoic acid and inclisiran.  Discussed mechanisms of action, dosing, side effects and potential decreases in LDL cholesterol.  Also reviewed cost information and potential options for patient assistance.  Answered all patient questions.  Based on this information, patient would prefer to increase atorvastatin to 80 mg daily.  We will do this for 3 months to see if he can get to goal on maximum dose.  If he cannot achieve an LDL < 55, we will then add Repatha 140 mg.   ? ?

## 2021-08-29 ENCOUNTER — Other Ambulatory Visit: Payer: Self-pay | Admitting: Pharmacist Clinician (PhC)/ Clinical Pharmacy Specialist

## 2021-08-29 DIAGNOSIS — E1169 Type 2 diabetes mellitus with other specified complication: Secondary | ICD-10-CM

## 2021-08-29 NOTE — Progress Notes (Signed)
Labs for lipid f/u

## 2021-09-20 LAB — HEPATIC FUNCTION PANEL
ALT: 22 IU/L (ref 0–44)
AST: 22 IU/L (ref 0–40)
Albumin: 4.1 g/dL (ref 3.8–4.8)
Alkaline Phosphatase: 70 IU/L (ref 44–121)
Bilirubin Total: 0.5 mg/dL (ref 0.0–1.2)
Bilirubin, Direct: 0.13 mg/dL (ref 0.00–0.40)
Total Protein: 6.9 g/dL (ref 6.0–8.5)

## 2021-09-20 LAB — LIPID PANEL
Chol/HDL Ratio: 3.1 ratio (ref 0.0–5.0)
Cholesterol, Total: 148 mg/dL (ref 100–199)
HDL: 47 mg/dL (ref >39)
LDL Chol Calc (NIH): 87 mg/dL (ref 0–99)
Triglycerides: 71 mg/dL (ref 0–149)
VLDL Cholesterol Cal: 14 mg/dL (ref 5–40)

## 2021-09-21 ENCOUNTER — Telehealth: Payer: Self-pay | Admitting: Cardiology

## 2021-09-21 NOTE — Telephone Encounter (Signed)
Spoke to patient's wife Lab results given.Lab results sent to PharmD for advice.Pharm D will be calling back.

## 2021-09-21 NOTE — Telephone Encounter (Signed)
Patient's wife returned call. She assumes it was regarding his lab results.

## 2021-09-21 NOTE — Telephone Encounter (Signed)
Called patient's wife no answer.Left message to call back.

## 2021-09-29 ENCOUNTER — Other Ambulatory Visit: Payer: Self-pay | Admitting: Neurosurgery

## 2021-09-29 DIAGNOSIS — M5442 Lumbago with sciatica, left side: Secondary | ICD-10-CM

## 2021-10-12 ENCOUNTER — Other Ambulatory Visit: Payer: Self-pay | Admitting: Cardiology

## 2021-10-14 ENCOUNTER — Ambulatory Visit
Admission: RE | Admit: 2021-10-14 | Discharge: 2021-10-14 | Disposition: A | Discharge status: Home / Self Care | Payer: Medicare Other | Source: Ambulatory Visit | Attending: Neurosurgery | Admitting: Neurosurgery

## 2021-10-14 DIAGNOSIS — M5442 Lumbago with sciatica, left side: Secondary | ICD-10-CM

## 2021-10-31 ENCOUNTER — Other Ambulatory Visit: Payer: Self-pay | Admitting: Neurosurgery

## 2021-11-07 ENCOUNTER — Other Ambulatory Visit: Payer: Self-pay | Admitting: Neurosurgery

## 2021-11-09 NOTE — Progress Notes (Signed)
Surgical Instructions    Your procedure is scheduled on Thursday October 5th.  Report to [facility] Main Entrance "A" at 1230 P.M., then check in with the Admitting office.  Call this number if you have problems the morning of surgery:  [phone]   If you have any questions prior to your surgery date call [phone]: Open Monday-Friday 8am-4pm If you experience any cold or flu symptoms such as cough, fever, chills, shortness of breath, etc. between now and your scheduled surgery, please notify us at the above number     Remember:  Do not eat after midnight the night before your surgery  You may drink clear liquids until 1130 the morning of your surgery.   Clear liquids allowed are: Water, Non-Citrus Juices (without pulp), Carbonated Beverages, Clear Tea, Black Coffee ONLY (NO MILK, CREAM OR POWDERED CREAMER of any kind), and Gatorade    Take these medicines the morning of surgery with A SIP OF WATER: amLODipine (NORVASC) 10 MG tablet atorvastatin (LIPITOR) 80 MG tablet carvedilol (COREG) 25 MG tablet  IF NEEDED  nitroGLYCERIN (NITROSTAT) 0.4 MG SL tablet   Per surgeon's order stop Plavix and fish oil 5 days prior to procedure.  As of today, STOP taking any Aspirin (unless otherwise instructed by your surgeon) Aleve, Naproxen, Ibuprofen, Motrin, Advil, Goody's, BC's, all herbal medications, fish oil, and all vitamins.   WHAT DO I DO ABOUT MY DIABETES MEDICATION?   Do not take oral diabetes medicines (Metformin) the morning of surgery.   HOW TO MANAGE YOUR DIABETES BEFORE AND AFTER SURGERY  Why is it important to control my blood sugar before and after surgery? Improving blood sugar levels before and after surgery helps healing and can limit problems. A way of improving blood sugar control is eating a healthy diet by:  Eating less sugar and carbohydrates  Increasing activity/exercise  Talking with your doctor about reaching your blood sugar goals High blood sugars  (greater than 180 mg/dL) can raise your risk of infections and slow your recovery, so you will need to focus on controlling your diabetes during the weeks before surgery. Make sure that the doctor who takes care of your diabetes knows about your planned surgery including the date and location.  How do I manage my blood sugar before surgery? Check your blood sugar at least 4 times a day, starting 2 days before surgery, to make sure that the level is not too high or low.  Check your blood sugar the morning of your surgery when you wake up and every 2 hours until you get to the Short Stay unit.  If your blood sugar is less than 70 mg/dL, you will need to treat for low blood sugar: Do not take insulin. Treat a low blood sugar (less than 70 mg/dL) with  cup of clear juice (cranberry or apple), 4 glucose tablets, OR glucose gel. Recheck blood sugar in 15 minutes after treatment (to make sure it is greater than 70 mg/dL). If your blood sugar is not greater than 70 mg/dL on recheck, call [phone] for further instructions. Report your blood sugar to the short stay nurse when you get to Short Stay.  If you are admitted to the hospital after surgery: Your blood sugar will be checked by the staff and you will probably be given insulin after surgery (instead of oral diabetes medicines) to make sure you have good blood sugar levels. The goal for blood sugar control after surgery is 80-180 mg/dL.  DAY OF SURGERY  Do not wear jewelry  Do not wear lotions, powders, cologne or deodorant. Do not shave 48 hours prior to surgery.  Men may shave face and neck. Do not bring valuables to the hospital. Do not wear nail polish  [facility] is not responsible for any belongings or valuables.    Do NOT Smoke (Tobacco/Vaping)  24 hours prior to your procedure  If you use a CPAP at night, you may bring your mask for your overnight stay.   Contacts, glasses, hearing aids, dentures or partials may not be  worn into surgery, please bring cases for these belongings   For patients admitted to the hospital, discharge time will be determined by your treatment team.   Patients discharged the day of surgery will not be allowed to drive home, and someone needs to stay with them for 24 hours.   SURGICAL WAITING ROOM VISITATION Patients having surgery or a procedure may have no more than 2 support people in the waiting area - these visitors may rotate.   Children under the age of 50 must have an adult with them who is not the patient. If the patient needs to stay at the hospital during part of their recovery, the visitor guidelines for inpatient rooms apply. Pre-op nurse will coordinate an appropriate time for 1 support person to accompany patient in pre-op.  This support person may not rotate.   Please refer to the [facility] website for the visitor guidelines for Inpatients (after your surgery is over and you are in a regular room).    Special instructions:    Oral Hygiene is also important to reduce your risk of infection.  Remember - BRUSH YOUR TEETH THE MORNING OF SURGERY WITH YOUR REGULAR TOOTHPASTE   [facility]- Preparing For Surgery  Before surgery, you can play an important role. Because skin is not sterile, your skin needs to be as free of germs as possible. You can reduce the number of germs on your skin by washing with CHG (chlorahexidine gluconate) Soap before surgery.  CHG is an antiseptic cleaner which kills germs and bonds with the skin to continue killing germs even after washing.     Please do not use if you have an allergy to CHG or antibacterial soaps. If your skin becomes reddened/irritated stop using the CHG.  Do not shave (including legs and underarms) for at least 48 hours prior to first CHG shower. It is OK to shave your face.  Please follow these instructions carefully.     Shower the NIGHT BEFORE SURGERY and the MORNING OF SURGERY with CHG Soap.   If you chose to  wash your hair, wash your hair first as usual with your normal shampoo. After you shampoo, rinse your hair and body thoroughly to remove the shampoo.  Then [employer] and genitals (private parts) with your normal soap and rinse thoroughly to remove soap.  After that Use CHG Soap as you would any other liquid soap. You can apply CHG directly to the skin and wash gently with a scrungie or a clean washcloth.   Apply the CHG Soap to your body ONLY FROM THE NECK DOWN.  Do not use on open wounds or open sores. Avoid contact with your eyes, ears, mouth and genitals (private parts). Wash Face and genitals (private parts)  with your normal soap.   Wash thoroughly, paying special attention to the area where your surgery will be performed.  Thoroughly rinse your body with warm water from the neck down.  DO NOT shower/wash with your normal soap after using and rinsing off the CHG Soap.  Pat yourself dry with a CLEAN TOWEL.  Wear CLEAN PAJAMAS to bed the night before surgery  Place CLEAN SHEETS on your bed the night before your surgery  DO NOT SLEEP WITH PETS.   Day of Surgery:  Take a shower with CHG soap. Wear Clean/Comfortable clothing the morning of surgery Do not apply any deodorants/lotions.   Remember to brush your teeth WITH YOUR REGULAR TOOTHPASTE.    If you received a COVID test during your pre-op visit, it is requested that you wear a mask when out in public, stay away from anyone that may not be feeling well, and notify your surgeon if you develop symptoms. If you have been in contact with anyone that has tested positive in the last 10 days, please notify your surgeon.    Please read over the following fact sheets that you were given.

## 2021-11-10 ENCOUNTER — Other Ambulatory Visit (HOSPITAL_COMMUNITY): Payer: Medicare Other

## 2021-11-10 ENCOUNTER — Encounter (HOSPITAL_COMMUNITY)
Admission: RE | Admit: 2021-11-10 | Discharge: 2021-11-10 | Disposition: A | Discharge status: Home / Self Care | Payer: Medicare Other | Source: Ambulatory Visit | Attending: Neurosurgery | Admitting: Neurosurgery

## 2021-11-10 ENCOUNTER — Encounter (HOSPITAL_COMMUNITY): Payer: Self-pay

## 2021-11-10 ENCOUNTER — Other Ambulatory Visit: Payer: Self-pay

## 2021-11-10 VITALS — BP 129/53 | HR 68 | Temp 98.4°F | Resp 17 | Ht 74.0 in | Wt 214.4 lb

## 2021-11-10 DIAGNOSIS — E1122 Type 2 diabetes mellitus with diabetic chronic kidney disease: Secondary | ICD-10-CM | POA: Diagnosis not present

## 2021-11-10 DIAGNOSIS — I2510 Atherosclerotic heart disease of native coronary artery without angina pectoris: Secondary | ICD-10-CM | POA: Insufficient documentation

## 2021-11-10 DIAGNOSIS — Z01818 Encounter for other preprocedural examination: Secondary | ICD-10-CM | POA: Diagnosis not present

## 2021-11-10 DIAGNOSIS — E118 Type 2 diabetes mellitus with unspecified complications: Secondary | ICD-10-CM | POA: Diagnosis not present

## 2021-11-10 DIAGNOSIS — M549 Dorsalgia, unspecified: Secondary | ICD-10-CM | POA: Diagnosis not present

## 2021-11-10 DIAGNOSIS — E785 Hyperlipidemia, unspecified: Secondary | ICD-10-CM | POA: Insufficient documentation

## 2021-11-10 DIAGNOSIS — K219 Gastro-esophageal reflux disease without esophagitis: Secondary | ICD-10-CM | POA: Diagnosis not present

## 2021-11-10 DIAGNOSIS — I255 Ischemic cardiomyopathy: Secondary | ICD-10-CM | POA: Insufficient documentation

## 2021-11-10 DIAGNOSIS — G8929 Other chronic pain: Secondary | ICD-10-CM | POA: Diagnosis not present

## 2021-11-10 DIAGNOSIS — Z951 Presence of aortocoronary bypass graft: Secondary | ICD-10-CM | POA: Insufficient documentation

## 2021-11-10 DIAGNOSIS — Z87891 Personal history of nicotine dependence: Secondary | ICD-10-CM | POA: Diagnosis not present

## 2021-11-10 DIAGNOSIS — I129 Hypertensive chronic kidney disease with stage 1 through stage 4 chronic kidney disease, or unspecified chronic kidney disease: Secondary | ICD-10-CM | POA: Diagnosis not present

## 2021-11-10 DIAGNOSIS — I440 Atrioventricular block, first degree: Secondary | ICD-10-CM | POA: Insufficient documentation

## 2021-11-10 DIAGNOSIS — I1A0 Resistant hypertension: Secondary | ICD-10-CM

## 2021-11-10 DIAGNOSIS — N1831 Chronic kidney disease, stage 3a: Secondary | ICD-10-CM | POA: Diagnosis not present

## 2021-11-10 DIAGNOSIS — M48062 Spinal stenosis, lumbar region with neurogenic claudication: Secondary | ICD-10-CM | POA: Diagnosis not present

## 2021-11-10 LAB — SURGICAL PCR SCREEN
MRSA, PCR: NEGATIVE
Staphylococcus aureus: NEGATIVE

## 2021-11-10 LAB — BASIC METABOLIC PANEL
Anion gap: 8 (ref 5–15)
BUN: 31 mg/dL — ABNORMAL HIGH (ref 8–23)
CO2: 23 mmol/L (ref 22–32)
Calcium: 9.7 mg/dL (ref 8.9–10.3)
Chloride: 103 mmol/L (ref 98–111)
Creatinine, Ser: 1.73 mg/dL — ABNORMAL HIGH (ref 0.61–1.24)
GFR, Estimated: 40 mL/min — ABNORMAL LOW (ref >60)
Glucose, Bld: 126 mg/dL — ABNORMAL HIGH (ref 70–99)
Potassium: 4 mmol/L (ref 3.5–5.1)
Sodium: 134 mmol/L — ABNORMAL LOW (ref 135–145)

## 2021-11-10 LAB — CBC
HCT: 31.8 % — ABNORMAL LOW (ref 39.0–52.0)
Hemoglobin: 10.6 g/dL — ABNORMAL LOW (ref 13.0–17.0)
MCH: 27 pg (ref 26.0–34.0)
MCHC: 33.3 g/dL (ref 30.0–36.0)
MCV: 81.1 fL (ref 80.0–100.0)
Platelets: 192 10*3/uL (ref 150–400)
RBC: 3.92 MIL/uL — ABNORMAL LOW (ref 4.22–5.81)
RDW: 13.4 % (ref 11.5–15.5)
WBC: 7.3 10*3/uL (ref 4.0–10.5)
nRBC: 0 % (ref 0.0–0.2)

## 2021-11-10 LAB — HEMOGLOBIN A1C
Hgb A1c MFr Bld: 6 % — ABNORMAL HIGH (ref 4.8–5.6)
Mean Plasma Glucose: 125.5 mg/dL

## 2021-11-10 LAB — GLUCOSE, CAPILLARY: Glucose-Capillary: 124 mg/dL — ABNORMAL HIGH (ref 70–99)

## 2021-11-10 NOTE — Progress Notes (Signed)
PCP - [facility] Cardiologist - Dr. Ellyn Hack   PPM/ICD - Denies Device Orders -  Rep Notified -   Chest x-ray - [facility] EKG - 11/10/21 Stress Test - 04/19/21 ECHO - 03/20/18 Cardiac Cath - Yes, "Been quite a few years ago"   Sleep Study - Denies  Diabetic II CBG 124 @ PAT appt Fasting Blood Sugar - 78 Checks Blood Sugar weekly  Blood Thinner Instructions:Plavix Per patient last dose on 11/10/21  ERAS Protcol -Yes  COVID TEST- NI   Anesthesia review: Yes cardiac history  Patient denies shortness of breath, fever, cough and chest pain at PAT appointment   All instructions explained to the patient, with a verbal understanding of the material. Patient agrees to go over the instructions while at home for a better understanding.  The opportunity to ask questions was provided.

## 2021-11-13 NOTE — Progress Notes (Addendum)
Anesthesia Chart Review:  Case: [id number] Date/Time: 11/16/21 1424   Procedure: L23 LAM/FORAM (Bilateral) - 3C   Anesthesia type: General   Pre-op diagnosis: LUMBAR STENOSIS WITH NEUROGENIC CLAUDICATION   Location: [place] OR ROOM 21 / [place] [facility]   Surgeons: Newman Pies, MD       DISCUSSION: Patient is a 81 year old male scheduled for the above procedure.  History includes former smoker (quit 09/26/80), CAD (CABG 05/22/10: LIMA-LAD, SVG-dRCA, SVG-Ramus Int, SVG-OM1-OM2; SVGs to RCA and sequential OM occluded 04/02/12, s/p complex PCI cutting balloon DES mCX & DES SVG-RI 04/08/12), ischemic cardiomyopathy, first degree AV block, HTN, dyslipidemia, carotid artery disease (89-38% LICA, 1-01% RICA 75/1025), DM2, PAD, GERD, chronic back pain, spinal surgery (L3-5 posterolateral in situ fusion 03/09/16), osteoarthritis (right TKA 10/26/10). Labs trends suggest CKD stage 3A since 2018.   Last audiology evaluation by Dr. Ellyn Hack was 04/26/2021.  Stress test in March 2023 was "negative for ischemia", moderate risk due to mildy decreased LVEF 53%, but showed improved perfusion and LVEF compared to prior study. Continue medical therapy for CAD recommended.  He did note that patient may hold Plavix for 5 to 7 days for surgery. Patient reported last Plavix 11/10/2021.  Creatinine 1.73--although CKD is not listed in his history, previous labs suggest at least CKD 3A as Creatinine has been ~ 1.3-1.5 with eGRF 52-58 since 2018. H/H 10.6/31.8. Message left for [facility] at Dr. Arnoldo Morale' office regarding Creatinine trends and H/H results for post-operative monitoring purposes. Requested PCP records and most recent labs for comparison. If records received and indicate new findings then will plan to update my note, otherwise anesthesia team to evaluate on the day of surgery.    VS: BP (!) 129/53   Pulse 68   Temp 36.9 C (Oral)   Resp 17   Ht '6\' 2"'$  (1.88 m)   Wt 97.3 kg   SpO2 100%   BMI 27.53 kg/m    PROVIDERS: Neale Burly, MD is PCP  Glenetta Hew, MD is cardiologist Napoleon Form, MD is vascular surgeon   LABS: Preoperative labs noted. BUN 31, Creatinine 1.73, previously ~ 1.3-1.5 range since 2018 per [facility] labs. See DISCUSSION.  (all labs ordered are listed, but only abnormal results are displayed)  Labs Reviewed  GLUCOSE, CAPILLARY - Abnormal; Notable for the following components:      Result Value   Glucose-Capillary 124 (*)    All other components within normal limits  HEMOGLOBIN A1C - Abnormal; Notable for the following components:   Hgb A1c MFr Bld 6.0 (*)    All other components within normal limits  CBC - Abnormal; Notable for the following components:   RBC 3.92 (*)    Hemoglobin 10.6 (*)    HCT 31.8 (*)    All other components within normal limits  BASIC METABOLIC PANEL - Abnormal; Notable for the following components:   Sodium 134 (*)    Glucose, Bld 126 (*)    BUN 31 (*)    Creatinine, Ser 1.73 (*)    GFR, Estimated 40 (*)    All other components within normal limits  SURGICAL PCR SCREEN    IMAGES: MRI L-spine 10/14/21: IMPRESSION: Multilevel degenerative changes as detailed above. Canal and subarticular recess stenosis are greatest at L2-L3. Left foraminal narrowing is greatest at L5-S1.    EKG: 11/10/21: Normal sinus rhythm with 1st degree A-V block Minimal voltage criteria for LVH, may be normal variant ( R in aVL ) Inferior infarct , age undetermined Cannot  rule out Anterior infarct , age undetermined Abnormal ECG When compared with ECG of 09-Apr-2012 05:32, PR interval has increased [PR 204 ms] Confirmed by Rudean Haskell [phone]) on 11/11/2021 10:57:10 PM   CV: Nuclear stress test 04/19/21: Findings: Negative for stress induced arrhythmias. No evidence of ischemia. There is an inferior perfusion defect in rest and stress with normal wall motion consistent with artifact. There is subdiaphragmatic attenuation.   Conclusions: Stress test is  negative for ischemia and less consistent with significant infarction. Moderate risk study due to mildly decreased LVEF (53%). Compared to prior: no ST depressions, No RV uptake, improved perfusion, improved LV function.   US Carotid 12/28/20: Summary:  Right Carotid: Velocities in the right ICA are consistent with a 1-39% stenosis.  Left Carotid: Velocities in the left ICA are consistent with a 40-59% stenosis.  Vertebrals:  Right vertebral artery demonstrates high resistant, antegrade flow. Left vertebral artery demonstrates minimal reconsituted flow.  Subclavians: Normal flow hemodynamics were seen in bilateral subclavian arteries.    Echo 03/20/18: IMPRESSIONS   1. The left ventricle has normal systolic function of 19-50%. The cavity  size was normal. There is moderately increased left ventricular wall  thickness of the basal-septum wall. Echo evidence of impaired diastolic  relaxation and indeterminate left  ventricular filling pressure.   2. The right ventricle has normal systolic function. The cavity was  normal. There is no increase in right ventricular wall thickness.   3. Left atrial size was moderately dilated.   4. The mitral valve is normal in structure.   5. The tricuspid valve is normal in structure.   6. The aortic valve is normal in structure. There is moderate thickening  and moderate calcification of the aortic valve.   7. The pulmonic valve was normal in structure.    Cardiac event monitor 2/620-04/18/18: Predominant rhythm is Sinus with bradycardia & tachycardia: Min HR 52 bpm. Max 124 bpm - Avg HR 69 bpm. Occasional PVCs noted. No arrhythmias or pauses noted to explain syncope.   Essentially normal monitor.     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 Ellyn Hack (Cardiologist): For Abnormal ST: CABG X 5; cath 03/2012 --> LIMA-LAD - patent, SVG-Ramus - anastamotic 80% - DES stent; SVG-RCA & SVG-OM1-OM2-- occluded; DES  Stent to Cx-OM1   Cardiomyopathy, ischemic- moderate LVD (EF of roughly 45%) at cath 2/143 11/03/2012   Resolved - f/u Echo with EF 55-60%   Carotid artery narrowings 03/2018   Moderate L ICA 40-59%   Carpal tunnel syndrome on right    Chronic back pain    spinal stenosis and scoliosis   DDD (degenerative disc disease), lumbosacral    Diabetes type 2, controlled ([place])    takes Metformin once a wk   DJD (degenerative joint disease) 9/12   Rt TKR   Dyslipidemia    takes Simvastatin daily   First degree heart block    GERD (gastroesophageal reflux disease)    History of colon polyps    benign   History of shingles    forehead --  dec 2016--  no residual pain   Osteoarthritis    Peripheral arterial disease ([place]) May 2016  (seen by dr berry)   L. ABI 0.58 with high grade L. SFA stenosis and occluded L. DP. R. ABI 0.48 with occluded R. SFA and occluded R. DP.   Pneumonia    hx of-as a child   Resistant hypertension  takes Coreg,HCTZ,and Micardis daily   Weakness    numbness and tingling    Past Surgical History:  Procedure Laterality Date   Carotid Dopplers  03/2018    Normal LV size and function.  EF 55 to 60%.  No R WMA.  Impaired relaxation (GR 1 DD).  Moderate LA dilation.  Moderate aortic thickening and calcification-sclerosis with no stenosis.   CARPAL TUNNEL RELEASE Right 10/10/2015   Procedure: RIGHT HAND CARPAL TUNNEL RELEASE;  Surgeon: Iran Planas, MD;  Location: [place];  Service: Orthopedics;  Laterality: Right;   COLONOSCOPY     CORONARY ANGIOPLASTY     3 stents   CORONARY ARTERY BYPASS GRAFT  05/22/2010  dr Lucianne Lei tright   x 5 (LIMA-LAD, SVG- RI,. SVG-OM1-OM2, SVG-dRCA)   DECOMPRESSIVE LUMBAR LAMINECTOMY LEVEL 1 N/A 03/08/2016   Procedure: Lumbar 3-5 decompression with in situ fusion;  Surgeon: Melina Schools, MD;  Location: [place];  Service: Orthopedics;  Laterality: N/A;   LEFT HEART CATH AND CORONARY ANGIOGRAPHY  05/15/2010   abnormal stress  test-- 3 vessel CAD/  LVEDP=33mHg  (Dr. HDebara Pickettfor Dr,. HEllyn Hack    LEFT HEART CATHETERIZATION WITH CORONARY ANGIOGRAM N/A 04/02/2012   Procedure: LEFT HEART CATHETERIZATION WITH CORONARY ANGIOGRAM;  Surgeon: DLeonie Man MD;  [facility]: Occluded SVG-rPDA & native RCA, 100% SVG-OM. Severe anastomotic lesion in SVG-RI.  Patent LIMA-LAD. Severe mid Cx-OM   NM MYOVIEW LTD  03/25/2012   A) High Risk TM Myoview w/ apical & upper mid to basal Ing wall Scar w/ significant iscemia Inf -> Inf-Lat from apex to base/  severe LV dysfxn - infero-apical dyskinesis and HK in Inf & Lat wall, EF 33%; b) large-size, mod-intensity ~ reversible perfusion defect in Inf Wall suggesting ischemia. EF 34%. Global HK & Severe Inf HK to AK. Also - horizontal ST depression Inf-Lat. HIGH RISK   NM MYOVIEW LTD  04/19/2021   LOW RISK.  EF 53%.  Fixed inferior perfusion defect (with normal wall motion-suspect artifact/diaphragmatic attenuation).   PERCUTANEOUS CORONARY STENT INTERVENTION (PCI-S) N/A 04/08/2012   Procedure: PERCUTANEOUS CORONARY STENT INTERVENTION (PCI-S);  Surgeon: DLeonie Man MD;  [facility]: Promus Premier DES 2.25 mm x 12 mm - Mid Cx-OM1; Promus Premier DES 2.5 mm x 16 mm -- anastomotic SVG-RI (graft into native)   TOTAL KNEE ARTHROPLASTY Right 10/26/2010   TRANSTHORACIC ECHOCARDIOGRAM  08/21/2010   mild LVH, EF 45-50%, inferior hypokinesis,  grade 1 diastolic dysfunction/  moderate LAE/   mild MR/  trivial TR/  mild dilated RV   TRANSTHORACIC ECHOCARDIOGRAM  03/2018    Normal LV size and function.  EF 55 to 60%.  No R WMA.  Impaired relaxation (GR 1 DD).  Moderate LA dilation.  Moderate aortic thickening and calcification-sclerosis with no stenosis.   UMBILICAL HERNIA REPAIR  1980's    MEDICATIONS:  amLODipine (NORVASC) 10 MG tablet   atorvastatin (LIPITOR) 80 MG tablet   carvedilol (COREG) 25 MG tablet   chlorthalidone (HYGROTON) 50 MG tablet   Cholecalciferol (VITAMIN D3)  50 MCG (2000 UT) CAPS   clopidogrel (PLAVIX) 75 MG tablet   fish oil-omega-3 fatty acids 1000 MG capsule   hydrocortisone cream 0.5 %   Menthol, Topical Analgesic, (ABSORBINE PLUS JR EX)   metFORMIN (GLUCOPHAGE) 500 MG tablet   naproxen sodium (ALEVE) 220 MG tablet   nitroGLYCERIN (NITROSTAT) 0.4 MG SL tablet   telmisartan (MICARDIS) 80 MG tablet   triamcinolone cream (KENALOG) 0.1 %  No current facility-administered medications for this encounter.    Myra Gianotti, PA-C Surgical Short Stay/Anesthesiology [facility] Phone [phone] [facility] Phone [phone] 11/14/2021 9:59 AM

## 2021-11-13 NOTE — Anesthesia Preprocedure Evaluation (Signed)
Anesthesia Evaluation  Patient identified by MRN, date of birth, ID band Patient awake    Reviewed: Allergy & Precautions, H&P , NPO status , Patient's Chart, lab work & pertinent test results, reviewed documented beta blocker date and time   Airway Mallampati: II  TM Distance: >3 FB Neck ROM: Full    Dental no notable dental hx. (+) Partial Lower, Partial Upper, Dental Advisory Given   Pulmonary neg pulmonary ROS, former smoker   Pulmonary exam normal breath sounds clear to auscultation       Cardiovascular hypertension, Pt. on medications and Pt. on home beta blockers + CAD, + Cardiac Stents, + CABG and + Peripheral Vascular Disease   Rhythm:Regular Rate:Normal     Neuro/Psych negative neurological ROS  negative psych ROS   GI/Hepatic Neg liver ROS,GERD  Medicated,,  Endo/Other  diabetes, Type 2, Oral Hypoglycemic Agents    Renal/GU negative Renal ROS  negative genitourinary   Musculoskeletal  (+) Arthritis , Osteoarthritis,    Abdominal   Peds  Hematology negative hematology ROS (+)   Anesthesia Other Findings   Reproductive/Obstetrics negative OB ROS                             Anesthesia Physical Anesthesia Plan  ASA: 3  Anesthesia Plan: General   Post-op Pain Management: Ofirmev IV (intra-op)*   Induction: Intravenous  PONV Risk Score and Plan: 3 and Ondansetron, Dexamethasone and Treatment may vary due to age or medical condition  Airway Management Planned: Oral ETT  Additional Equipment:   Intra-op Plan:   Post-operative Plan: Extubation in OR  Informed Consent: I have reviewed the patients History and Physical, chart, labs and discussed the procedure including the risks, benefits and alternatives for the proposed anesthesia with the patient or authorized representative who has indicated his/her understanding and acceptance.     Dental advisory given  Plan  Discussed with: CRNA  Anesthesia Plan Comments: (PAT note written by Shonna Chock, PA-C. )        Anesthesia Quick Evaluation

## 2021-11-16 ENCOUNTER — Encounter (HOSPITAL_COMMUNITY): Payer: Self-pay | Admitting: Neurosurgery

## 2021-11-16 ENCOUNTER — Ambulatory Visit (HOSPITAL_BASED_OUTPATIENT_CLINIC_OR_DEPARTMENT_OTHER): Payer: Medicare Other | Admitting: Anesthesiology

## 2021-11-16 ENCOUNTER — Ambulatory Visit (HOSPITAL_COMMUNITY)
Admission: RE | Admit: 2021-11-16 | Discharge: 2021-11-17 | Disposition: A | Discharge status: Home / Self Care | Payer: Medicare Other | Attending: Neurosurgery | Admitting: Neurosurgery

## 2021-11-16 ENCOUNTER — Ambulatory Visit (HOSPITAL_COMMUNITY): Payer: Medicare Other

## 2021-11-16 ENCOUNTER — Other Ambulatory Visit: Payer: Self-pay

## 2021-11-16 ENCOUNTER — Encounter (HOSPITAL_COMMUNITY)
Admission: RE | Disposition: A | Discharge status: Home / Self Care | Payer: Self-pay | Source: Home / Self Care | Attending: Neurosurgery

## 2021-11-16 ENCOUNTER — Ambulatory Visit (HOSPITAL_COMMUNITY): Payer: Medicare Other | Admitting: Vascular Surgery

## 2021-11-16 DIAGNOSIS — K219 Gastro-esophageal reflux disease without esophagitis: Secondary | ICD-10-CM | POA: Diagnosis not present

## 2021-11-16 DIAGNOSIS — E1151 Type 2 diabetes mellitus with diabetic peripheral angiopathy without gangrene: Secondary | ICD-10-CM | POA: Insufficient documentation

## 2021-11-16 DIAGNOSIS — Z7984 Long term (current) use of oral hypoglycemic drugs: Secondary | ICD-10-CM | POA: Insufficient documentation

## 2021-11-16 DIAGNOSIS — M1990 Unspecified osteoarthritis, unspecified site: Secondary | ICD-10-CM | POA: Insufficient documentation

## 2021-11-16 DIAGNOSIS — E1169 Type 2 diabetes mellitus with other specified complication: Secondary | ICD-10-CM

## 2021-11-16 DIAGNOSIS — E118 Type 2 diabetes mellitus with unspecified complications: Secondary | ICD-10-CM

## 2021-11-16 DIAGNOSIS — Z87891 Personal history of nicotine dependence: Secondary | ICD-10-CM

## 2021-11-16 DIAGNOSIS — M48062 Spinal stenosis, lumbar region with neurogenic claudication: Secondary | ICD-10-CM | POA: Diagnosis not present

## 2021-11-16 DIAGNOSIS — I2510 Atherosclerotic heart disease of native coronary artery without angina pectoris: Secondary | ICD-10-CM | POA: Diagnosis not present

## 2021-11-16 DIAGNOSIS — Z951 Presence of aortocoronary bypass graft: Secondary | ICD-10-CM | POA: Diagnosis not present

## 2021-11-16 DIAGNOSIS — W19XXXA Unspecified fall, initial encounter: Secondary | ICD-10-CM | POA: Insufficient documentation

## 2021-11-16 DIAGNOSIS — M5416 Radiculopathy, lumbar region: Secondary | ICD-10-CM | POA: Diagnosis not present

## 2021-11-16 DIAGNOSIS — M545 Low back pain, unspecified: Secondary | ICD-10-CM | POA: Insufficient documentation

## 2021-11-16 DIAGNOSIS — I10 Essential (primary) hypertension: Secondary | ICD-10-CM

## 2021-11-16 DIAGNOSIS — S0001XA Abrasion of scalp, initial encounter: Secondary | ICD-10-CM | POA: Diagnosis not present

## 2021-11-16 DIAGNOSIS — Z955 Presence of coronary angioplasty implant and graft: Secondary | ICD-10-CM | POA: Insufficient documentation

## 2021-11-16 HISTORY — PX: LUMBAR LAMINECTOMY/DECOMPRESSION MICRODISCECTOMY: SHX5026

## 2021-11-16 LAB — GLUCOSE, CAPILLARY
Glucose-Capillary: 116 mg/dL — ABNORMAL HIGH (ref 70–99)
Glucose-Capillary: 110 mg/dL — ABNORMAL HIGH (ref 70–99)
Glucose-Capillary: 251 mg/dL — ABNORMAL HIGH (ref 70–99)

## 2021-11-16 SURGERY — LUMBAR LAMINECTOMY/DECOMPRESSION MICRODISCECTOMY 1 LEVEL
Anesthesia: General | Laterality: Bilateral

## 2021-11-16 MED ORDER — ATORVASTATIN CALCIUM 80 MG PO TABS: 80.0000 mg | ORAL_TABLET | Freq: Every day | ORAL | Status: DC

## 2021-11-16 MED ORDER — ACETAMINOPHEN 650 MG RE SUPP: 650.0000 mg | RECTAL | Status: DC | PRN

## 2021-11-16 MED ORDER — ONDANSETRON HCL 4 MG/2ML IJ SOLN
INTRAMUSCULAR | Status: DC | PRN
  Administered 2021-11-16: 4 mg via INTRAVENOUS

## 2021-11-16 MED ORDER — LIDOCAINE 2% (20 MG/ML) 5 ML SYRINGE
INTRAMUSCULAR | Status: DC | PRN
  Administered 2021-11-16: 60 mg via INTRAVENOUS

## 2021-11-16 MED ORDER — THROMBIN 5000 UNITS EX SOLR
OROMUCOSAL | Status: DC | PRN
  Administered 2021-11-16: 5 mL via TOPICAL

## 2021-11-16 MED ORDER — SODIUM CHLORIDE 0.9 % IV SOLN
250.0000 mL | INTRAVENOUS | Status: DC
  Administered 2021-11-16: 250 mL via INTRAVENOUS

## 2021-11-16 MED ORDER — CHLORHEXIDINE GLUCONATE 0.12 % MT SOLN
15.0000 mL | Freq: Once | OROMUCOSAL | Status: AC
  Administered 2021-11-16: 15 mL via OROMUCOSAL
  Filled 2021-11-16: qty 15

## 2021-11-16 MED ORDER — CHLORHEXIDINE GLUCONATE CLOTH 2 % EX PADS: 6.0000 | MEDICATED_PAD | Freq: Once | CUTANEOUS | Status: DC

## 2021-11-16 MED ORDER — MORPHINE SULFATE (PF) 4 MG/ML IV SOLN: 4.0000 mg | INTRAVENOUS | Status: DC | PRN

## 2021-11-16 MED ORDER — SODIUM CHLORIDE 0.9% FLUSH: 3.0000 mL | Freq: Two times a day (BID) | INTRAVENOUS | Status: DC

## 2021-11-16 MED ORDER — PHENYLEPHRINE HCL-NACL 20-0.9 MG/250ML-% IV SOLN
INTRAVENOUS | Status: DC | PRN
  Administered 2021-11-16: 20 ug/min via INTRAVENOUS

## 2021-11-16 MED ORDER — AMLODIPINE BESYLATE 5 MG PO TABS: 10.0000 mg | ORAL_TABLET | Freq: Every day | ORAL | Status: DC

## 2021-11-16 MED ORDER — ONDANSETRON HCL 4 MG PO TABS: 4.0000 mg | ORAL_TABLET | Freq: Four times a day (QID) | ORAL | Status: DC | PRN

## 2021-11-16 MED ORDER — INSULIN ASPART 100 UNIT/ML IJ SOLN
0.0000 [IU] | Freq: Every day | INTRAMUSCULAR | Status: DC
  Administered 2021-11-16: 3 [IU] via SUBCUTANEOUS

## 2021-11-16 MED ORDER — ONDANSETRON HCL 4 MG/2ML IJ SOLN: 4.0000 mg | Freq: Four times a day (QID) | INTRAMUSCULAR | Status: DC | PRN

## 2021-11-16 MED ORDER — ROCURONIUM BROMIDE 100 MG/10ML IV SOLN
INTRAVENOUS | Status: DC | PRN
  Administered 2021-11-16: 60 mg via INTRAVENOUS
  Administered 2021-11-16: 20 mg via INTRAVENOUS

## 2021-11-16 MED ORDER — CYCLOBENZAPRINE HCL 10 MG PO TABS
10.0000 mg | ORAL_TABLET | Freq: Three times a day (TID) | ORAL | Status: DC | PRN
  Administered 2021-11-16: 10 mg via ORAL
  Filled 2021-11-16: qty 1

## 2021-11-16 MED ORDER — BACITRACIN ZINC 500 UNIT/GM EX OINT
TOPICAL_OINTMENT | CUTANEOUS | Status: AC
  Filled 2021-11-16: qty 28.35

## 2021-11-16 MED ORDER — PHENYLEPHRINE HCL (PRESSORS) 10 MG/ML IV SOLN
INTRAVENOUS | Status: DC | PRN
  Administered 2021-11-16: 80 ug via INTRAVENOUS

## 2021-11-16 MED ORDER — OXYCODONE HCL 5 MG PO TABS
10.0000 mg | ORAL_TABLET | ORAL | Status: DC | PRN
  Administered 2021-11-16 (×2): 10 mg via ORAL
  Filled 2021-11-16 (×2): qty 2

## 2021-11-16 MED ORDER — BUPIVACAINE-EPINEPHRINE (PF) 0.5% -1:200000 IJ SOLN
INTRAMUSCULAR | Status: DC | PRN
  Administered 2021-11-16: 20 mL

## 2021-11-16 MED ORDER — EPHEDRINE SULFATE (PRESSORS) 50 MG/ML IJ SOLN
INTRAMUSCULAR | Status: DC | PRN
  Administered 2021-11-16: 5 mg via INTRAVENOUS

## 2021-11-16 MED ORDER — BUPIVACAINE-EPINEPHRINE (PF) 0.5% -1:200000 IJ SOLN
INTRAMUSCULAR | Status: AC
  Filled 2021-11-16: qty 30

## 2021-11-16 MED ORDER — INSULIN ASPART 100 UNIT/ML IJ SOLN
0.0000 [IU] | Freq: Three times a day (TID) | INTRAMUSCULAR | Status: DC
  Administered 2021-11-17: 3 [IU] via SUBCUTANEOUS

## 2021-11-16 MED ORDER — ACETAMINOPHEN 10 MG/ML IV SOLN
INTRAVENOUS | Status: AC
  Filled 2021-11-16: qty 100

## 2021-11-16 MED ORDER — FENTANYL CITRATE (PF) 100 MCG/2ML IJ SOLN
INTRAMUSCULAR | Status: DC | PRN
  Administered 2021-11-16 (×2): 25 ug via INTRAVENOUS
  Administered 2021-11-16: 50 ug via INTRAVENOUS

## 2021-11-16 MED ORDER — PROPOFOL 10 MG/ML IV BOLUS
INTRAVENOUS | Status: DC | PRN
  Administered 2021-11-16: 150 mg via INTRAVENOUS

## 2021-11-16 MED ORDER — THROMBIN 5000 UNITS EX SOLR
CUTANEOUS | Status: AC
  Filled 2021-11-16: qty 5000

## 2021-11-16 MED ORDER — INSULIN ASPART 100 UNIT/ML IJ SOLN: 0.0000 [IU] | INTRAMUSCULAR | Status: DC | PRN

## 2021-11-16 MED ORDER — MENTHOL 3 MG MT LOZG: 1.0000 | LOZENGE | OROMUCOSAL | Status: DC | PRN

## 2021-11-16 MED ORDER — ACETAMINOPHEN 325 MG PO TABS
650.0000 mg | ORAL_TABLET | ORAL | Status: DC | PRN
  Filled 2021-11-16: qty 2

## 2021-11-16 MED ORDER — CARVEDILOL 25 MG PO TABS
25.0000 mg | ORAL_TABLET | Freq: Two times a day (BID) | ORAL | Status: DC
  Administered 2021-11-16: 25 mg via ORAL
  Filled 2021-11-16: qty 1

## 2021-11-16 MED ORDER — CEFAZOLIN SODIUM-DEXTROSE 2-4 GM/100ML-% IV SOLN
2.0000 g | Freq: Three times a day (TID) | INTRAVENOUS | Status: AC
  Administered 2021-11-16 (×2): 2 g via INTRAVENOUS
  Filled 2021-11-16 (×2): qty 100

## 2021-11-16 MED ORDER — BISACODYL 10 MG RE SUPP: 10.0000 mg | Freq: Every day | RECTAL | Status: DC | PRN

## 2021-11-16 MED ORDER — CEFAZOLIN SODIUM-DEXTROSE 2-4 GM/100ML-% IV SOLN
2.0000 g | INTRAVENOUS | Status: AC
  Administered 2021-11-16: 2 g via INTRAVENOUS
  Filled 2021-11-16: qty 100

## 2021-11-16 MED ORDER — OXYCODONE HCL 5 MG PO TABS
5.0000 mg | ORAL_TABLET | ORAL | Status: DC | PRN
  Administered 2021-11-17: 5 mg via ORAL
  Filled 2021-11-16: qty 1

## 2021-11-16 MED ORDER — PROPOFOL 10 MG/ML IV BOLUS
INTRAVENOUS | Status: AC
  Filled 2021-11-16: qty 20

## 2021-11-16 MED ORDER — ORAL CARE MOUTH RINSE: 15.0000 mL | Freq: Once | OROMUCOSAL | Status: AC

## 2021-11-16 MED ORDER — FENTANYL CITRATE (PF) 250 MCG/5ML IJ SOLN
INTRAMUSCULAR | Status: AC
  Filled 2021-11-16: qty 5

## 2021-11-16 MED ORDER — SUGAMMADEX SODIUM 200 MG/2ML IV SOLN
INTRAVENOUS | Status: DC | PRN
  Administered 2021-11-16: 300 mg via INTRAVENOUS

## 2021-11-16 MED ORDER — 0.9 % SODIUM CHLORIDE (POUR BTL) OPTIME
TOPICAL | Status: DC | PRN
  Administered 2021-11-16: 1000 mL

## 2021-11-16 MED ORDER — CHLORTHALIDONE 25 MG PO TABS
50.0000 mg | ORAL_TABLET | Freq: Every day | ORAL | Status: DC
  Administered 2021-11-16: 50 mg via ORAL
  Filled 2021-11-16: qty 2

## 2021-11-16 MED ORDER — LACTATED RINGERS IV SOLN: INTRAVENOUS | Status: DC

## 2021-11-16 MED ORDER — BACITRACIN ZINC 500 UNIT/GM EX OINT
TOPICAL_OINTMENT | CUTANEOUS | Status: DC | PRN
  Administered 2021-11-16: 1 via TOPICAL

## 2021-11-16 MED ORDER — DEXAMETHASONE SODIUM PHOSPHATE 4 MG/ML IJ SOLN
INTRAMUSCULAR | Status: DC | PRN
  Administered 2021-11-16: 10 mg via INTRAVENOUS

## 2021-11-16 MED ORDER — ACETAMINOPHEN 10 MG/ML IV SOLN
INTRAVENOUS | Status: DC | PRN
  Administered 2021-11-16: 1000 mg via INTRAVENOUS

## 2021-11-16 MED ORDER — ACETAMINOPHEN 500 MG PO TABS
1000.0000 mg | ORAL_TABLET | Freq: Four times a day (QID) | ORAL | Status: DC
  Administered 2021-11-16 – 2021-11-17 (×3): 1000 mg via ORAL
  Filled 2021-11-16 (×3): qty 2

## 2021-11-16 MED ORDER — NAPROXEN SODIUM 275 MG PO TABS: 275.0000 mg | ORAL_TABLET | Freq: Every day | ORAL | Status: DC | PRN

## 2021-11-16 MED ORDER — IRBESARTAN 150 MG PO TABS
300.0000 mg | ORAL_TABLET | Freq: Every day | ORAL | Status: DC
  Administered 2021-11-16: 300 mg via ORAL
  Filled 2021-11-16: qty 2

## 2021-11-16 MED ORDER — NITROGLYCERIN 0.4 MG SL SUBL: 0.4000 mg | SUBLINGUAL_TABLET | SUBLINGUAL | Status: DC | PRN

## 2021-11-16 MED ORDER — PHENOL 1.4 % MT LIQD: 1.0000 | OROMUCOSAL | Status: DC | PRN

## 2021-11-16 MED ORDER — METFORMIN HCL 500 MG PO TABS
500.0000 mg | ORAL_TABLET | ORAL | Status: DC
  Filled 2021-11-16: qty 1

## 2021-11-16 MED ORDER — DOCUSATE SODIUM 100 MG PO CAPS
100.0000 mg | ORAL_CAPSULE | Freq: Two times a day (BID) | ORAL | Status: DC
  Administered 2021-11-16: 100 mg via ORAL
  Filled 2021-11-16: qty 1

## 2021-11-16 MED ORDER — SODIUM CHLORIDE 0.9% FLUSH: 3.0000 mL | INTRAVENOUS | Status: DC | PRN

## 2021-11-16 SURGICAL SUPPLY — 44 items
BAG COUNTER SPONGE SURGICOUNT (BAG) ×1 IMPLANT
BAND RUBBER #18 3X1/16 STRL (MISCELLANEOUS) ×2 IMPLANT
BENZOIN TINCTURE PRP APPL 2/3 (GAUZE/BANDAGES/DRESSINGS) ×1 IMPLANT
BLADE CLIPPER SURG (BLADE) IMPLANT
BUR MATCHSTICK NEURO 3.0 LAGG (BURR) ×1 IMPLANT
BUR PRECISION FLUTE 6.0 (BURR) ×1 IMPLANT
CANISTER SUCT 3000ML PPV (MISCELLANEOUS) ×1 IMPLANT
DRAPE LAPAROTOMY 100X72X124 (DRAPES) ×1 IMPLANT
DRAPE MICROSCOPE SLANT 54X150 (MISCELLANEOUS) ×1 IMPLANT
DRAPE SURG 17X23 STRL (DRAPES) ×4 IMPLANT
DRSG OPSITE POSTOP 4X8 (GAUZE/BANDAGES/DRESSINGS) IMPLANT
ELECT BLADE 4.0 EZ CLEAN MEGAD (MISCELLANEOUS) ×1
ELECT REM PT RETURN 9FT ADLT (ELECTROSURGICAL) ×1
ELECTRODE BLDE 4.0 EZ CLN MEGD (MISCELLANEOUS) ×1 IMPLANT
ELECTRODE REM PT RTRN 9FT ADLT (ELECTROSURGICAL) ×1 IMPLANT
GAUZE 4X4 16PLY ~~LOC~~+RFID DBL (SPONGE) IMPLANT
GAUZE SPONGE 4X4 12PLY STRL (GAUZE/BANDAGES/DRESSINGS) ×1 IMPLANT
GLOVE BIO SURGEON STRL SZ8 (GLOVE) ×1 IMPLANT
GLOVE BIO SURGEON STRL SZ8.5 (GLOVE) ×1 IMPLANT
GLOVE EXAM NITRILE XL STR (GLOVE) IMPLANT
GLOVE SS BIOGEL STRL SZ 6.5 (GLOVE) IMPLANT
GOWN STRL REUS W/ TWL LRG LVL3 (GOWN DISPOSABLE) IMPLANT
GOWN STRL REUS W/ TWL XL LVL3 (GOWN DISPOSABLE) ×1 IMPLANT
GOWN STRL REUS W/TWL 2XL LVL3 (GOWN DISPOSABLE) IMPLANT
GOWN STRL REUS W/TWL LRG LVL3 (GOWN DISPOSABLE) ×1
GOWN STRL REUS W/TWL XL LVL3 (GOWN DISPOSABLE) ×3
HEMOSTAT POWDER KIT SURGIFOAM (HEMOSTASIS) IMPLANT
KIT BASIN OR (CUSTOM PROCEDURE TRAY) ×1 IMPLANT
KIT TURNOVER KIT B (KITS) ×1 IMPLANT
NDL HYPO 21X1.5 SAFETY (NEEDLE) IMPLANT
NEEDLE HYPO 21X1.5 SAFETY (NEEDLE) IMPLANT
NEEDLE HYPO 22GX1.5 SAFETY (NEEDLE) ×1 IMPLANT
NS IRRIG 1000ML POUR BTL (IV SOLUTION) ×1 IMPLANT
PACK LAMINECTOMY NEURO (CUSTOM PROCEDURE TRAY) ×1 IMPLANT
PAD ARMBOARD 7.5X6 YLW CONV (MISCELLANEOUS) ×3 IMPLANT
PATTIES SURGICAL .5 X1 (DISPOSABLE) IMPLANT
SPONGE SURGIFOAM ABS GEL SZ50 (HEMOSTASIS) ×1 IMPLANT
STRIP CLOSURE SKIN 1/2X4 (GAUZE/BANDAGES/DRESSINGS) ×1 IMPLANT
SUT VIC AB 1 CT1 18XBRD ANBCTR (SUTURE) ×1 IMPLANT
SUT VIC AB 1 CT1 8-18 (SUTURE) ×1
SUT VIC AB 2-0 CP2 18 (SUTURE) ×1 IMPLANT
TOWEL GREEN STERILE (TOWEL DISPOSABLE) ×1 IMPLANT
TOWEL GREEN STERILE FF (TOWEL DISPOSABLE) ×1 IMPLANT
WATER STERILE IRR 1000ML POUR (IV SOLUTION) ×1 IMPLANT

## 2021-11-16 NOTE — H&P (Signed)
Subjective: The patient is a 81 year old black male who has complained of back and bilateral leg pain and weakness consistent with neurogenic claudication.  He has failed medical management was worked up with a lumbar MRI which demonstrated severe multifactoral small stenosis at L2-3.  I discussed the various treatment options with the patient.  He has decided proceed with surgery.  Of note he took a fall today suffering a minor abrasion on his right scalp.  He does not feel he has injured himself and wants to proceed with surgery.  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 Ellyn Hack (Cardiologist): For Abnormal ST: CABG X 5; cath 03/2012 --> LIMA-LAD - patent, SVG-Ramus - anastamotic 80% - DES stent; SVG-RCA & SVG-OM1-OM2-- occluded; DES Stent to Cx-OM1   Cardiomyopathy, ischemic- moderate LVD (EF of roughly 45%) at cath 2/143 11/03/2012   Resolved - f/u Echo with EF 55-60%   Carotid artery narrowings 03/2018   Moderate L ICA 40-59%   Carpal tunnel syndrome on right    Chronic back pain    spinal stenosis and scoliosis   DDD (degenerative disc disease), lumbosacral    Diabetes type 2, controlled ([place])    takes Metformin once a wk   DJD (degenerative joint disease) 9/12   Rt TKR   Dyslipidemia    takes Simvastatin daily   First degree heart block    GERD (gastroesophageal reflux disease)    History of colon polyps    benign   History of shingles    forehead --  dec 2016--  no residual pain   Osteoarthritis    Peripheral arterial disease ([place]) May 2016  (seen by dr berry)   L. ABI 0.58 with high grade L. SFA stenosis and occluded L. DP. R. ABI 0.48 with occluded R. SFA and occluded R. DP.   Pneumonia    hx of-as a child   Resistant hypertension    takes Coreg,HCTZ,and Micardis daily   Weakness    numbness and tingling    Past Surgical History:  Procedure Laterality Date   Carotid Dopplers  03/2018    Normal LV size and function.  EF 55 to  60%.  No R WMA.  Impaired relaxation (GR 1 DD).  Moderate LA dilation.  Moderate aortic thickening and calcification-sclerosis with no stenosis.   CARPAL TUNNEL RELEASE Right 10/10/2015   Procedure: RIGHT HAND CARPAL TUNNEL RELEASE;  Surgeon: Iran Planas, MD;  Location: [place];  Service: Orthopedics;  Laterality: Right;   COLONOSCOPY     CORONARY ANGIOPLASTY     3 stents   CORONARY ARTERY BYPASS GRAFT  05/22/2010  dr Lucianne Lei tright   x 5 (LIMA-LAD, SVG- RI,. SVG-OM1-OM2, SVG-dRCA)   DECOMPRESSIVE LUMBAR LAMINECTOMY LEVEL 1 N/A 03/08/2016   Procedure: Lumbar 3-5 decompression with in situ fusion;  Surgeon: Melina Schools, MD;  Location: [place];  Service: Orthopedics;  Laterality: N/A;   LEFT HEART CATH AND CORONARY ANGIOGRAPHY  05/15/2010   abnormal stress test-- 3 vessel CAD/  LVEDP=9mHg  (Dr. HDebara Pickettfor Dr,. HEllyn Hack    LEFT HEART CATHETERIZATION WITH CORONARY ANGIOGRAM N/A 04/02/2012   Procedure: LEFT HEART CATHETERIZATION WITH CORONARY ANGIOGRAM;  Surgeon: DLeonie Man MD;  [facility] Occluded SVG-rPDA & native RCA, 100% SVG-OM. Severe anastomotic lesion in SVG-RI.  Patent LIMA-LAD. Severe mid Cx-OM   NM MYOVIEW LTD  03/25/2012   A) High Risk TM Myoview w/ apical & upper mid to  basal Ing wall Scar w/ significant iscemia Inf -> Inf-Lat from apex to base/  severe LV dysfxn - infero-apical dyskinesis and HK in Inf & Lat wall, EF 33%; b) large-size, mod-intensity ~ reversible perfusion defect in Inf Wall suggesting ischemia. EF 34%. Global HK & Severe Inf HK to AK. Also - horizontal ST depression Inf-Lat. HIGH RISK   NM MYOVIEW LTD  04/19/2021   LOW RISK.  EF 53%.  Fixed inferior perfusion defect (with normal wall motion-suspect artifact/diaphragmatic attenuation).   PERCUTANEOUS CORONARY STENT INTERVENTION (PCI-S) N/A 04/08/2012   Procedure: PERCUTANEOUS CORONARY STENT INTERVENTION (PCI-S);  Surgeon: Leonie Man, MD;  [facility] CATH LAB: Promus Premier DES  2.25 mm x 12 mm - Mid Cx-OM1; Promus Premier DES 2.5 mm x 16 mm -- anastomotic SVG-RI (graft into native)   TOTAL KNEE ARTHROPLASTY Right 10/26/2010   TRANSTHORACIC ECHOCARDIOGRAM  08/21/2010   mild LVH, EF 45-50%, inferior hypokinesis,  grade 1 diastolic dysfunction/  moderate LAE/   mild MR/  trivial TR/  mild dilated RV   TRANSTHORACIC ECHOCARDIOGRAM  03/2018    Normal LV size and function.  EF 55 to 60%.  No R WMA.  Impaired relaxation (GR 1 DD).  Moderate LA dilation.  Moderate aortic thickening and calcification-sclerosis with no stenosis.   UMBILICAL HERNIA REPAIR  1980's    Allergies  Allergen Reactions   No Known Allergies     Social History   Tobacco Use   Smoking status: Former    Types: Cigarettes    Quit date: 09/26/1980    Years since quitting: 41.1   Smokeless tobacco: Never  Substance Use Topics   Alcohol use: No    Family History  Problem Relation Age of Onset   Cancer Mother    Prior to Admission medications   Medication Sig Start Date End Date Taking? Authorizing Provider  amLODipine (NORVASC) 10 MG tablet Take 1 tablet (10 mg total) by mouth daily. 01/09/21 07/26/22 Yes Leonie Man, MD  atorvastatin (LIPITOR) 80 MG tablet Take 1 tablet (80 mg total) by mouth daily. 06/13/21  Yes Leonie Man, MD  carvedilol (COREG) 25 MG tablet TAKE 1 TABLET (25 MG TOTAL) BY MOUTH 2 (TWO) TIMES DAILY. Patient taking differently: Take 25 mg by mouth 2 (two) times daily with a meal. 12/17/17  Yes Leonie Man, MD  chlorthalidone (HYGROTON) 50 MG tablet Take 1 tablet (50 mg total) by mouth daily. 01/09/21  Yes Leonie Man, MD  Cholecalciferol (VITAMIN D3) 50 MCG (2000 UT) CAPS Take 2,000 Units by mouth daily.   Yes [provider]  clopidogrel (PLAVIX) 75 MG tablet Take 1 tablet (75 mg total) by mouth daily. 01/20/21  Yes Leonie Man, MD  fish oil-omega-3 fatty acids 1000 MG capsule Take 1 g by mouth daily.   Yes [provider]  metFORMIN  (GLUCOPHAGE) 500 MG tablet Take 500 mg by mouth once a week.   Yes [provider]  naproxen sodium (ALEVE) 220 MG tablet Take 220 mg by mouth daily as needed (pain).   Yes [provider]  nitroGLYCERIN (NITROSTAT) 0.4 MG SL tablet Place 1 tablet (0.4 mg total) under the tongue every 5 (five) minutes as needed for chest pain. 04/09/12  Yes Kilroy, Luke K, PA-C  telmisartan (MICARDIS) 80 MG tablet Take 1 tablet (80 mg total) by mouth daily. 01/20/21  Yes Leonie Man, MD  hydrocortisone cream 0.5 % Apply 1 Application topically daily as needed for itching.  [provider]  Menthol, Topical Analgesic, (ABSORBINE PLUS JR EX) Apply 1 application  topically daily as needed (Pain).    [provider]  triamcinolone cream (KENALOG) 0.1 % Apply 1 Application topically daily as needed (Rash). 03/05/19   [provider]     Review of Systems  Positive ROS: As above  All other systems have been reviewed and were otherwise negative with the exception of those mentioned in the HPI and as above.  Objective: Vital signs in last 24 hours: Temp:  [98.8 F (37.1 C)] 98.8 F (37.1 C) (10/05 1253) Pulse Rate:  [62] 62 (10/05 1253) Resp:  [18] 18 (10/05 1253) BP: (136)/(88) 136/88 (10/05 1253) SpO2:  [100 %] 100 % (10/05 1253) Weight:  [95.3 kg] 95.3 kg (10/05 1253) Estimated body mass index is 26.96 kg/m as calculated from the following:   Height as of this encounter: '6\' 2"'$  (1.88 m).   Weight as of this encounter: 95.3 kg.   General Appearance: Alert Head: Normocephalic, without obvious abnormality, atraumatic, small abrasion just lateral to his right eye. Eyes: PERRL, conjunctiva/corneas clear, EOM's intact,    Ears: Normal  Throat: Normal  Neck: Supple, Back: unremarkable Lungs: Clear to auscultation bilaterally, respirations unlabored Heart: Regular rate and rhythm, no murmur, rub or gallop Abdomen: Soft, non-tender Extremities: Extremities  normal, atraumatic, no cyanosis or edema Skin: unremarkable  NEUROLOGIC:   Mental status: alert and oriented,Motor Exam - grossly normal Sensory Exam - grossly normal Reflexes:  Coordination - grossly normal Gait - grossly normal Balance - grossly normal Cranial Nerves: I: smell Not tested  II: visual acuity  OS: Normal  OD: Normal   II: visual fields Full to confrontation  II: pupils Equal, round, reactive to light  III,VII: ptosis None  III,IV,VI: extraocular muscles  Full ROM  V: mastication Normal  V: facial light touch sensation  Normal  V,VII: corneal reflex  Present  VII: facial muscle function - upper  Normal  VII: facial muscle function - lower Normal  VIII: hearing Not tested  IX: soft palate elevation  Normal  IX,X: gag reflex Present  XI: trapezius strength  5/5  XI: sternocleidomastoid strength 5/5  XI: neck flexion strength  5/5  XII: tongue strength  Normal    Data Review Lab Results  Component Value Date   WBC 7.3 11/10/2021   HGB 10.6 (L) 11/10/2021   HCT 31.8 (L) 11/10/2021   MCV 81.1 11/10/2021   PLT 192 11/10/2021   Lab Results  Component Value Date   NA 134 (L) 11/10/2021   K 4.0 11/10/2021   CL 103 11/10/2021   CO2 23 11/10/2021   BUN 31 (H) 11/10/2021   CREATININE 1.73 (H) 11/10/2021   GLUCOSE 126 (H) 11/10/2021   Lab Results  Component Value Date   INR 1.02 04/08/2012    Assessment/Plan: Lumbar spinal stenosis, lumbago, lumbar radiculopathy, neurogenic claudication I have discussed the situation with the patient and his wife.  I reviewed his imaging studies with him and pointed out the abnormalities.  We have discussed the various treatment options including surgery.  I have described the surgical treatment option of bilateral L2-3 laminotomy/foraminotomies.  I have shown him surgical models.  I have given him a surgical pamphlet.  We have discussed the risk, benefits, alternatives, expected postop course, and likelihood of achieving  our goals with surgery.  I have answered all his questions.  He has decided proceed with surgery.   Ophelia Charter 11/16/2021 2:16 PM

## 2021-11-16 NOTE — Anesthesia Procedure Notes (Signed)
Procedure Name: Intubation Date/Time: 11/16/2021 2:55 PM  Performed by: Thelma Comp, CRNAPre-anesthesia Checklist: Patient identified, Emergency Drugs available, Suction available and Patient being monitored Patient Re-evaluated:Patient Re-evaluated prior to induction Oxygen Delivery Method: Circle System Utilized Preoxygenation: Pre-oxygenation with 100% oxygen Induction Type: IV induction Ventilation: Mask ventilation without difficulty Laryngoscope Size: Glidescope and 4 Grade View: Grade I Tube type: Oral Tube size: 7.5 mm Number of attempts: 1 Airway Equipment and Method: Rigid stylet and Video-laryngoscopy Placement Confirmation: ETT inserted through vocal cords under direct vision, positive ETCO2 and breath sounds checked- equal and bilateral Secured at: 21 cm Tube secured with: Tape Dental Injury: Teeth and Oropharynx as per pre-operative assessment  Comments: Elective glide

## 2021-11-16 NOTE — Anesthesia Postprocedure Evaluation (Signed)
Anesthesia Post Note  Patient: [name]  Procedure(s) Performed: L23 LAM/FORAM (Bilateral)     Patient location during evaluation: PACU Anesthesia Type: General Level of consciousness: awake and alert Pain management: pain level controlled Vital Signs Assessment: post-procedure vital signs reviewed and stable Respiratory status: spontaneous breathing, nonlabored ventilation and respiratory function stable Cardiovascular status: blood pressure returned to baseline and stable Postop Assessment: no apparent nausea or vomiting Anesthetic complications: no   No notable events documented.  Last Vitals:  Vitals:   11/16/21 1630 11/16/21 1718  BP:  (!) 150/66  Pulse:  64  Resp:  19  Temp: 36.4 C   SpO2:  100%    Last Pain:  Vitals:   11/16/21 1630  TempSrc:   PainSc: 0-No pain    LLE Motor Response: Purposeful movement (11/16/21 1747) LLE Sensation: Full sensation (11/16/21 1747) RLE Motor Response: Purposeful movement (11/16/21 1747) RLE Sensation: Full sensation (11/16/21 1747)      French Kendra,W. EDMOND

## 2021-11-16 NOTE — Op Note (Signed)
Brief history: The patient is a 81 year old black male who has had previous lumbar surgery.  He has complained of back and leg pain consistent with neurogenic claudication.  He failed medical management and was worked up with a lumbar MRI which demonstrated severe spinal stenosis at L2-3.  I discussed the various treatment options with him.  He has decided proceed with surgery.  Preop diagnosis: Lumbar spinal stenosis, neurogenic claudication, lumbago  Postop diagnosis: The same  Procedure: Bilateral L2-3 laminotomy/foraminotomies  Surgeon: Dr. Earle Gell  Assistant: Dr. Consuella Lose  Anesthesia: General tracheal  Estimated blood loss minimal  Specimens: None  Drains: None  Complications: None  Description of procedure: The patient was brought to the operating room by the anesthesia team.  General endotracheal anesthesia was induced.  The patient was turned to the prone position on the Wilson frame.  His lumbosacral region was then shaved with clippers and prepared with Betadine scrub and Betadine solution.  I injected the area to be incised with Marcaine with epinephrine solution.  I used a scalpel to make a midline incision over the L2-3 disc base.  I used electrocautery to perform a bilateral subperiosteal dissection exposing the spinous process and lamina of L2 and L3.  We inserted the [facility] retractor for exposure.  We obtained intraoperative to confirm location.  I then used a high-speed drill to perform a left L2-3 laminotomy.  I drilled across the midline exposing the right L2-3 lamina.  I used a Kerrison punch to remove the ligamentum flavum and widen the laminotomies.  I performed foraminotomies about the bilateral L3 nerve root.  We inspected the root of this and L2-3.  There were no herniations.  The thecal sac and the bilateral L3 nerve roots were well decompressed.  We obtained hemostasis using bipolar cautery.  We irrigated the wound out with dilute solution.  We  then obtained stasis with bipolar cautery.  We remove the retractor.  We reapproximated patient's thoracolumbar fascia with interrupted #1 Vicryl suture.  We reapproximated the patient's subcutaneous tissue with interrupted 2-0 Vicryl suture.  We reapproximated the skin with Steri-Strips and benzoin.  The wound was then coated with bacitracin ointment.  A sterile dressing was applied.  The drapes were removed.  By report all sponge, instrument, and needle counts were correct at the end of this case.

## 2021-11-16 NOTE — Progress Notes (Signed)
Pt was walking down the main hallway to the pre-op area with hospital volunteer when he tripped on his own feet and had a fall. Pt stated that he hit his head on the wall and braced his fall with his hands. Pt with a gash above the right eye at his eyebrow. Skin loss noted with a raised area. Guaze and paper tape applied. Full head to toe assessment completed and no deformities noted. Pt denies pain, dizziness, blurred vision or headache. Only pain noted is back pain which is his normal pain and reason for surgery today. Pt refuses ED evaluation. Dr. Arnoldo Morale notified. No new orders at this time. Pt will be prepped for surgery.  Jacqlyn Larsen

## 2021-11-16 NOTE — Transfer of Care (Signed)
Immediate Anesthesia Transfer of Care Note  Patient: [name]  Procedure(s) Performed: L23 LAM/FORAM (Bilateral)  Patient Location: PACU  Anesthesia Type:General  Level of Consciousness: drowsy and patient cooperative  Airway & Oxygen Therapy: Patient Spontanous Breathing  Post-op Assessment: Report given to RN and Post -op Vital signs reviewed and stable  Post vital signs: Reviewed and stable  Last Vitals:  Vitals Value Taken Time  BP 150/56 11/16/21 1630  Temp 36.4 C 11/16/21 1630  Pulse 56 11/16/21 1631  Resp 13 11/16/21 1631  SpO2 94 % 11/16/21 1631  Vitals shown include unvalidated device data.  Last Pain:  Vitals:   11/16/21 1253  TempSrc: Oral         Complications: No notable events documented.

## 2021-11-17 ENCOUNTER — Encounter (HOSPITAL_COMMUNITY): Payer: Self-pay | Admitting: Neurosurgery

## 2021-11-17 DIAGNOSIS — M48062 Spinal stenosis, lumbar region with neurogenic claudication: Secondary | ICD-10-CM | POA: Diagnosis not present

## 2021-11-17 LAB — GLUCOSE, CAPILLARY: Glucose-Capillary: 184 mg/dL — ABNORMAL HIGH (ref 70–99)

## 2021-11-17 MED ORDER — OXYCODONE-ACETAMINOPHEN 5-325 MG PO TABS: 1.0000 | ORAL_TABLET | ORAL | Status: DC | PRN

## 2021-11-17 MED ORDER — CYCLOBENZAPRINE HCL 5 MG PO TABS: 5.0000 mg | ORAL_TABLET | Freq: Three times a day (TID) | ORAL | 0 refills | Status: DC | PRN

## 2021-11-17 MED ORDER — CYCLOBENZAPRINE HCL 5 MG PO TABS: 5.0000 mg | ORAL_TABLET | Freq: Three times a day (TID) | ORAL | Status: DC | PRN

## 2021-11-17 MED ORDER — OXYCODONE-ACETAMINOPHEN 5-325 MG PO TABS: 1.0000 | ORAL_TABLET | ORAL | 0 refills | Status: DC | PRN

## 2021-11-17 MED ORDER — DOCUSATE SODIUM 100 MG PO CAPS: 100.0000 mg | ORAL_CAPSULE | Freq: Two times a day (BID) | ORAL | 0 refills | Status: AC

## 2021-11-17 NOTE — Progress Notes (Signed)
Patient discharged home per order. Discharged instructions given with stated understanding. Patient's wife has all belongings at discharge

## 2021-11-17 NOTE — Evaluation (Signed)
Occupational Therapy Evaluation Patient Details Name: [name] MRN: [id number] DOB: 02-16-1940 Today's Date: 11/17/2021   History of Present Illness 81 yo M s/p posterior decompression.  PMH includes prior PLIF, TKR and HTN.   Clinical Impression   Patient admitted for the procedure above.  PTA he lives at home with his spouse, who is able to assist as needed.  He is doing well, essentially at his baseline.  Precautions reviewed, and all questions answered.  No further rehab needs in the acute setting.  Recommend follow up with MD as prescribed.        Recommendations for follow up therapy are one component of a multi-disciplinary discharge planning process, led by the attending physician.  Recommendations may be updated based on patient status, additional functional criteria and insurance authorization.   Follow Up Recommendations  No OT follow up    Assistance Recommended at Discharge PRN  Patient can return home with the following Assist for transportation    Functional Status Assessment  Patient has not had a recent decline in their functional status  Equipment Recommendations  None recommended by OT    Recommendations for Other Services       Precautions / Restrictions Precautions Precautions: Back Precaution Booklet Issued: Yes (comment) Restrictions Weight Bearing Restrictions: No      Mobility Bed Mobility Overal bed mobility: Modified Independent                  Transfers Overall transfer level: Modified independent Equipment used: Rolling walker (2 wheels)                      Balance Overall balance assessment: Needs assistance Sitting-balance support: Feet supported Sitting balance-Leahy Scale: Normal     Standing balance support: Reliant on assistive device for balance Standing balance-Leahy Scale: Good Standing balance comment: walking in room without AD                           ADL either performed or  assessed with clinical judgement   ADL Overall ADL's : At baseline                                       General ADL Comments: Min A with socks     Vision Patient Visual Report: No change from baseline       Perception Perception Perception: Within Functional Limits   Praxis Praxis Praxis: Intact    Pertinent Vitals/Pain Pain Assessment Pain Assessment: No/denies pain     Hand Dominance Right   Extremity/Trunk Assessment Upper Extremity Assessment Upper Extremity Assessment: Overall WFL for tasks assessed   Lower Extremity Assessment Lower Extremity Assessment: Overall WFL for tasks assessed   Cervical / Trunk Assessment Cervical / Trunk Assessment: Back Surgery   Communication Communication Communication: No difficulties   Cognition Arousal/Alertness: Awake/alert Behavior During Therapy: WFL for tasks assessed/performed Overall Cognitive Status: Within Functional Limits for tasks assessed                                       General Comments   VSS on RA    Exercises     Shoulder Instructions      Home Living Family/patient expects to be discharged to:: Private residence Living Arrangements:  Spouse/significant other Available Help at Discharge: Family;Available 24 hours/day Type of Home: House Home Access: Level entry     Home Layout: Two level;Able to live on main level with bedroom/bathroom;Laundry or work area in Building surveyor of Steps: stair lift to basement   [employer] Shower/Tub: Occupational psychologist: Handicapped height Bathroom Accessibility: Yes How Accessible: Accessible via walker Home Equipment: Conservation officer, nature (2 wheels);Cane - single point          Prior Functioning/Environment Prior Level of Function : Independent/Modified Independent;Driving                        OT Problem List: Impaired balance (sitting and/or standing)      OT  Treatment/Interventions:      OT Goals(Current goals can be found in the care plan section) Acute Rehab OT Goals Patient Stated Goal: Return home OT Goal Formulation: With patient Time For Goal Achievement: 11/20/21 Potential to Achieve Goals: Good  OT Frequency:      Co-evaluation              AM-PAC OT "6 Clicks" Daily Activity     Outcome Measure Help from another person eating meals?: None Help from another person taking care of personal grooming?: None Help from another person toileting, which includes using toliet, bedpan, or urinal?: None Help from another person bathing (including washing, rinsing, drying)?: None Help from another person to put on and taking off regular upper body clothing?: None Help from another person to put on and taking off regular lower body clothing?: A Little 6 Click Score: 23   End of Session Equipment Utilized During Treatment: Rolling walker (2 wheels) Nurse Communication: Mobility status  Activity Tolerance: Patient tolerated treatment well Patient left: in bed;with call bell/phone within reach  OT Visit Diagnosis: Unsteadiness on feet (R26.81)                Time: 9169-4503 OT Time Calculation (min): 20 min Charges:  OT General Charges $OT Visit: 1 Visit OT Evaluation $OT Eval Moderate Complexity: 1 Mod  11/17/2021  RP, OTR/L  Acute Rehabilitation Services  Office:  [phone]   Metta Clines 11/17/2021, 9:03 AM

## 2021-11-17 NOTE — Progress Notes (Signed)
PT Cancellation Note and Discharge  Patient Details Name: [name] MRN: [id number] DOB: 04-25-1940   Cancelled Treatment:    Reason Eval/Treat Not Completed: PT screened, no needs identified, will sign off. Discussed pt case with OT who reports pt is currently mobilizing at a modified independent level and does not require a formal PT evaluation at this time. PT signing off. If needs change, please reconsult.     [name] 11/17/2021, 9:40 AM  Rolinda Roan, PT, DPT Acute Rehabilitation Services Secure Chat Preferred Office: [phone]

## 2021-11-17 NOTE — Discharge Summary (Signed)
Physician Discharge Summary  Patient ID: [name] MRN: [id number] DOB/AGE: 03/15/1940 81 y.o.  Admit date: 11/16/2021 Discharge date: 11/17/2021  Admission Diagnoses: Lumbago, lumbar radiculopathy, neurogenic claudication, lumbar spinal stenosis  Discharge Diagnoses: The same Principal Problem:   Spinal stenosis of lumbar region with neurogenic claudication   Discharged Condition: good  Hospital Course: I performed bilateral L2-3 laminotomy/foraminotomies on the patient on 11/16/2021.  The surgery went well.  The patient's postoperative course was unremarkable.  On postoperative day 1 he felt well and requested discharge to home.  The patient, and his wife, were given verbal and written discharge instructions.  All her questions were answered.  Consults: PT, OT, care management Significant Diagnostic Studies: None Treatments: Bilateral L2-3 laminotomy/foraminotomies Discharge Exam: Blood pressure (!) 128/48, pulse 70, temperature 98 F (36.7 C), temperature source Oral, resp. rate 20, height '6\' 2"'$  (1.88 m), weight 95.3 kg, SpO2 99 %. The patient is alert and pleasant.  He looks well.  His strength is normal.  Disposition: Home  Discharge Instructions     Call MD for:  difficulty breathing, headache or visual disturbances   Complete by: As directed    Call MD for:  extreme fatigue   Complete by: As directed    Call MD for:  hives   Complete by: As directed    Call MD for:  persistant dizziness or light-headedness   Complete by: As directed    Call MD for:  persistant nausea and vomiting   Complete by: As directed    Call MD for:  redness, tenderness, or signs of infection (pain, swelling, redness, odor or green/yellow discharge around incision site)   Complete by: As directed    Call MD for:  severe uncontrolled pain   Complete by: As directed    Call MD for:  temperature >100.4   Complete by: As directed    Diet - low sodium heart healthy   Complete by: As  directed    Discharge instructions   Complete by: As directed    Call [phone] for a followup appointment. Take a stool softener while you are using pain medications.   Driving Restrictions   Complete by: As directed    Do not drive for 2 weeks.   Increase activity slowly   Complete by: As directed    Lifting restrictions   Complete by: As directed    Do not lift more than 5 pounds. No excessive bending or twisting.   May shower / Bathe   Complete by: As directed    Remove the dressing for 3 days after surgery.  You may shower, but leave the incision alone.   Remove dressing in 48 hours   Complete by: As directed       Allergies as of 11/17/2021       Reactions   No Known Allergies         Medication List     TAKE these medications    ABSORBINE PLUS JR EX Apply 1 application  topically daily as needed (Pain).   amLODipine 10 MG tablet Commonly known as: NORVASC Take 1 tablet (10 mg total) by mouth daily.   atorvastatin 80 MG tablet Commonly known as: LIPITOR Take 1 tablet (80 mg total) by mouth daily.   carvedilol 25 MG tablet Commonly known as: COREG TAKE 1 TABLET (25 MG TOTAL) BY MOUTH 2 (TWO) TIMES DAILY. What changed: when to take this   chlorthalidone 50 MG tablet Commonly known as: HYGROTON Take 1  tablet (50 mg total) by mouth daily.   clopidogrel 75 MG tablet Commonly known as: PLAVIX Take 1 tablet (75 mg total) by mouth daily.   cyclobenzaprine 5 MG tablet Commonly known as: FLEXERIL Take 1 tablet (5 mg total) by mouth 3 (three) times daily as needed for muscle spasms.   docusate sodium 100 MG capsule Commonly known as: COLACE Take 1 capsule (100 mg total) by mouth 2 (two) times daily.   fish oil-omega-3 fatty acids 1000 MG capsule Take 1 g by mouth daily.   hydrocortisone cream 0.5 % Apply 1 Application topically daily as needed for itching.   metFORMIN 500 MG tablet Commonly known as: GLUCOPHAGE Take 500 mg by mouth once a week.    naproxen sodium 220 MG tablet Commonly known as: ALEVE Take 220 mg by mouth daily as needed (pain).   nitroGLYCERIN 0.4 MG SL tablet Commonly known as: Nitrostat Place 1 tablet (0.4 mg total) under the tongue every 5 (five) minutes as needed for chest pain.   oxyCODONE-acetaminophen 5-325 MG tablet Commonly known as: PERCOCET/ROXICET Take 1-2 tablets by mouth every 4 (four) hours as needed for moderate pain.   telmisartan 80 MG tablet Commonly known as: MICARDIS Take 1 tablet (80 mg total) by mouth daily.   triamcinolone cream 0.1 % Commonly known as: KENALOG Apply 1 Application topically daily as needed (Rash).   vitamin D3 50 MCG (2000 UT) Caps Take 2,000 Units by mouth daily.         Signed: Ophelia Charter 11/17/2021, 6:45 AM

## 2021-12-25 ENCOUNTER — Other Ambulatory Visit: Payer: Self-pay | Admitting: Cardiology

## 2022-01-19 ENCOUNTER — Other Ambulatory Visit: Payer: Self-pay | Admitting: Cardiology

## 2022-02-09 ENCOUNTER — Telehealth: Payer: Self-pay | Admitting: Cardiology

## 2022-02-09 NOTE — Telephone Encounter (Signed)
Pt c/o swelling: STAT is pt has developed SOB within 24 hours  If swelling, where is the swelling located? Both feet and ankles, and hands, more in the right hand  How much weight have you gained and in what time span? Can't tell  Have you gained 3 pounds in a day or 5 pounds in a week? Not sure  Do you have a log of your daily weights (if so, list)? no  Are you currently taking a fluid pill? yes  Are you currently SOB? no  Have you traveled recently? no  Patient's wife says the patient has a lot of swelling in his feet, ankles and hands. She says it won't go away and only eases in his feet if he stay off them, but the swelling comes back again.

## 2022-02-27 ENCOUNTER — Other Ambulatory Visit (HOSPITAL_COMMUNITY): Payer: Self-pay | Admitting: Otolaryngology

## 2022-02-27 DIAGNOSIS — H9312 Tinnitus, left ear: Secondary | ICD-10-CM

## 2022-03-12 ENCOUNTER — Encounter: Payer: Self-pay | Admitting: Cardiology

## 2022-03-12 ENCOUNTER — Ambulatory Visit: Payer: Medicare Other | Attending: Cardiology | Admitting: Cardiology

## 2022-03-12 VITALS — BP 130/78 | HR 64 | Ht 74.0 in | Wt 220.2 lb

## 2022-03-12 DIAGNOSIS — I255 Ischemic cardiomyopathy: Secondary | ICD-10-CM | POA: Diagnosis not present

## 2022-03-12 DIAGNOSIS — Z95828 Presence of other vascular implants and grafts: Secondary | ICD-10-CM

## 2022-03-12 DIAGNOSIS — E1169 Type 2 diabetes mellitus with other specified complication: Secondary | ICD-10-CM

## 2022-03-12 DIAGNOSIS — I25810 Atherosclerosis of coronary artery bypass graft(s) without angina pectoris: Secondary | ICD-10-CM | POA: Diagnosis not present

## 2022-03-12 DIAGNOSIS — I1A0 Resistant hypertension: Secondary | ICD-10-CM

## 2022-03-12 DIAGNOSIS — I2510 Atherosclerotic heart disease of native coronary artery without angina pectoris: Secondary | ICD-10-CM | POA: Diagnosis not present

## 2022-03-12 DIAGNOSIS — Z955 Presence of coronary angioplasty implant and graft: Secondary | ICD-10-CM

## 2022-03-12 DIAGNOSIS — I739 Peripheral vascular disease, unspecified: Secondary | ICD-10-CM

## 2022-03-12 DIAGNOSIS — E785 Hyperlipidemia, unspecified: Secondary | ICD-10-CM

## 2022-03-12 MED ORDER — EZETIMIBE 10 MG PO TABS: 10.0000 mg | ORAL_TABLET | Freq: Every day | ORAL | 3 refills | Status: DC

## 2022-03-12 NOTE — Patient Instructions (Addendum)
Medication Instructions:   Start taking Zetia ( Ezetimibe ) 10 mg daily   *If you need a refill on your cardiac medications before your next appointment, please call your pharmacy*   Lab Work:in March 2024 Lipid Liver panel   If you have labs (blood work) drawn today and your tests are completely normal, you will receive your results only by: MyChart Message (if you have MyChart) OR A paper copy in the mail If you have any lab test that is abnormal or we need to change your treatment, we will call you to review the results.   Testing/Procedures: Not needed   Follow-Up: At [facility], you and your health needs are our priority.  As part of our continuing mission to provide you with exceptional heart care, we have created designated Provider Care Teams.  These Care Teams include your primary Cardiologist (physician) and Advanced Practice Providers (APPs -  Physician Assistants and Nurse Practitioners) who all work together to provide you with the care you need, when you need it.  We recommend signing up for the patient portal called "MyChart".  Sign up information is provided on this After Visit Summary.  MyChart is used to connect with patients for Virtual Visits (Telemedicine).  Patients are able to view lab/test results, encounter notes, upcoming appointments, etc.  Non-urgent messages can be sent to your provider as well.   To learn more about what you can do with MyChart, go to [email or web address].    Your next appointment:   8 month(s)  The format for your next appointment:   In Person  Provider:   Glenetta Hew, MD

## 2022-03-12 NOTE — Progress Notes (Signed)
Primary Care Provider: Neale Burly, MD [place] Cardiologist: Glenetta Hew, MD Electrophysiologist: None  Referring Provider: Neale Burly, MD  Clinic Note: No chief complaint on file.  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 - Primary (Chronic)    Myoview in March 2023 was negative for ischemia.  There was a suggestion of inferior perfusion defect which was thought to be more consistent with diaphragmatic attenuation.  EF was normal with no wall motion abnormality.  No active angina symptoms.  Has been stable overall.  Lipids not quite at goal which we will be addressing.  Blood pressure finally appears to be controlled.  Plan: Continue high-dose 25 mg twice daily carvedilol along with amlodipine 10 mg daily for antianginal benefit. He is also on 80 mg Micardis along with 50 mg chlorthalidone for afterload reduction He is on high-dose 80 mg atorvastatin along with fish oil. ->  Would lipids not quite at goal but LDL down to 83, will try Zetia 10 mg and then reassess labs in March.  If not at goal may want to consider either Nexletol plus or minus converting to PCSK9 inhibitor. He is on monotherapy Plavix maintenance without any bleeding issues. Okay to hold Plavix 5 to 7 days preop for surgeries or procedures.       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 Status post PCI to SVG-RI (Chronic)    Remarkably, doing well with no active angina symptoms.  He really has not had symptoms regardless of whether he has had stenoses or not. Most recent Myoview was nonischemic.      Relevant Medications   ezetimibe (ZETIA) 10 MG  tablet   Resistant hypertension (Chronic)    Blood pressure is a bit hard to control.  Now currently controlled on high-dose chlorthalidone 50 mg daily along with 80 mg Micardis, 2000 twice daily carvedilol and 10 mg amlodipine.  Continue current regimen.  Hopefully he can get back into his exercise regimen to lose back some weight which will also help.      Relevant Medications   ezetimibe (ZETIA) 10 MG tablet   Peripheral arterial disease (HCC) (Chronic)    No active claudication symptoms.  Continue risk factor modification      Relevant Medications   ezetimibe (ZETIA) 10 MG tablet   Cardiomyopathy, ischemic-EF improved from 45% post CABG up to 55-60% (Chronic)    Most recent EF by echo and Myoview were pretty much back into the low normal range.  No active CHF symptoms.  Trivial HFpEF symptoms but did have some edema.  Seems to be controlled now chlorthalidone. No real PND or orthopnea.  Aggressive BP control with high doses of amlodipine, carvedilol and telmisartan as well as chlorthalidone...   With borderline glycemic control-A1c ranging between 6.3-6.6, low threshold to consider SGLT2 inhibitor.      Relevant Medications   ezetimibe (ZETIA) 10 MG tablet   Other Relevant Orders   EKG 12-Lead (Completed)     Other   Presence of Saphenous Vein Bypass Graft stent -- anastomotic SVG-RI lesion, Promus Premier DES 2.5 mm x 16 mm (postdilated to 0.8 in graft) (Chronic)  Presence of drug coated stent in left circumflex coronary artery; Promus Premier 2.25 mm x 12 mm (Chronic)    On long-term Plavix monotherapy for maintenance.  He is far enough out from most recent PCI to hold Plavix if necessary 5 to 7 days preop for surgeries or procedures.      Dyslipidemia associated with type 2 diabetes mellitus (HCC) (Chronic)    A1c thankfully went down below 6.3 so he still back in the prediabetes range on no diabetes medications.  Unfortunately, his LDL still not at goal.  He is on  high-dose 80 mg atorvastatin along with fish oil. ->  Would lipids not quite at goal but LDL down to 83, will try Zetia 10 mg and then reassess labs in March.  If not at goal may want to consider either Nexletol plus or minus converting to PCSK9 inhibitor.  Would like to consider SGLT2 inhibitor for mild diuresis effect and glycemic control.  Defer to PCP but low threshold to make adjustment.        Relevant Medications   ezetimibe (ZETIA) 10 MG tablet   Other Relevant Orders   Hepatic function panel   Lipid panel   ===================================  HPI:    [name] is a 82 y.o. male with a PMH below who presents today for early annual follow-up.  CAD-CABG-PCI; (resistant) HTN/HLD, DM-2 Initial cardiac evaluation was for Preoperative Risk Stratification back in April 2012.-> Nuclear stress test was grossly abnormal and cardiac cath showed MV CAD => referred for CABG. 2 years later - Abnormal surveillance Stress Test: Return the Cath Lab: Occluded SVG -RCA & SVG-OM1-OM 2 --> PCI of native LCx-OM1 and SVG-RI Myoview 04/2016 - read as HIGH RISK b/c reduced LVEF -- Echo confirmed relatively Normal EF. -- checked Echo.  No ischemia - Med Rx.  2-D echo 03/21/2018: EF ~55-60% with GRD 1 DD.  Moderate LA dilation.  Moderate aortic thickening/sclerosis no stenosis. Myoview 04/19/2021: LOW RISK.  EF 53%.  Fixed inferior perfusion defect (with normal wall motion-suspect artifact/diaphragmatic attenuation).  [name] was last seen on April 26, 2021 following his Myoview stress test.  Read as LOW RISK with a fixed inferior defect-consistent with diaphragmatic attenuation because of normal wall motion.  EF was 53%.  I  He was then seen by Tommy Medal, Foreman from our lipid clinic in May 2023.  Atorvastatin dose was increased to 80 daily as a first choice.  Plan was to recheck in 3 months, but did not happen.  I  Recent Hospitalizations:  11/16/2021: Normal admitted for Spinal  Surgery L2-L3 Laminectomy   Reviewed  CV studies:    The following studies were reviewed today: (if available, images/films reviewed: From Epic Chart or Care Everywhere) No new studies:  Interval History:   [name] returns here for early annual follow-up doing pretty well.  He says his back is better but not all the way better after his surgery.  He certainly does not have a radicular pain anymore.  He still has some soreness in his back but the sciatic radicular pain is notably improved.  He is able to walk again better.  He still has some left leg swelling, but then the day goes down.  Apparently they called in about this at the end of December but it seems of stabilized out.  He said he just got labs done by his PCP last week.  We were actually able to print those out showed slight improvement  in LDL and A1c levels but also noted a slight bump up in his creatinine level.  [name] is an interesting gentleman that he never really had any cardiac symptoms of chest tightness or pressure with rest or exertion despite having multivessel disease leading to CABG he is still relatively active and denies any cardiac symptoms of chest pain pressure dyspnea with rest or exertion.  He is deconditioned after his back surgery and may be is little more dyspneic with exertion than he had been but no real change.  No PND, orthopnea with only trivial left leg edema.  He does note having gained weight after his back surgery because he was somewhat immobilized for a little while but is hoping to lose that back now that the holiday season is over.  Now that his back is better he is looking forward to doing the [facility] exercising again.  He usually was doing the treadmill as well as resistance exercises machines for recent lower.  He would occasionally mixed in walking on the track as well.  CV Review of Symptoms (Summary): Cardiovascular ROS: no chest pain or dyspnea on exertion positive for - edema and  some exercise intolerance]. negative for - irregular heartbeat, orthopnea, palpitations, paroxysmal nocturnal dyspnea, rapid heart rate, shortness of breath, or syncope/near degree of TIA/amaurosis fugax, claudication  He is due to see an ENT because he is having issues with left-sided ear problems.  He feels his pulse pulsating in his ear despite having an MRI done in February.  REVIEWED OF SYSTEMS   Review of Systems  Constitutional:  Negative for malaise/fatigue (Just some exercise intolerance because of the knee.) and weight loss (Unfortunately he has gained weight.).  HENT:  Negative for congestion.   Respiratory:  Negative for cough and shortness of breath.   Cardiovascular:  Positive for leg swelling (Mostly noted on the left side which is along with his back pain issues.).  Gastrointestinal:  Negative for blood in stool, diarrhea and melena.  Genitourinary:  Negative for hematuria.  Musculoskeletal:  Positive for back pain (Much better now.). Negative for joint pain.  Neurological:  Negative for dizziness, tingling (No longer having the left leg radicular pain.) and weakness.  Psychiatric/Behavioral:  Negative for memory loss. The patient is not nervous/anxious and does not have insomnia.    I have reviewed and (if needed) personally updated the patient's problem list, medications, allergies, past medical and surgical history, social and family history.   PAST MEDICAL HISTORY   Past Medical History:  Diagnosis Date   CAD, multiple vessel 05/2010; 03/2012   Dr. Ellyn Hack (Cardiologist): For Abnormal ST: CABG X 5; cath 03/2012 --> LIMA-LAD - patent, SVG-Ramus - anastamotic 80% - DES stent; SVG-RCA & SVG-OM1-OM2-- occluded; DES Stent to Cx-OM1   Cardiomyopathy, ischemic- moderate LVD (EF of roughly 45%) at cath 2/143 11/03/2012   Resolved - f/u Echo with EF 55-60%   Carotid artery narrowings 03/2018   Moderate L ICA 40-59%   Carpal tunnel syndrome on right    Chronic back pain     spinal stenosis and scoliosis   DDD (degenerative disc disease), lumbosacral    Diabetes type 2, controlled ([place])    takes Metformin once a wk   DJD (degenerative joint disease) 9/12   Rt TKR   Dyslipidemia    takes Simvastatin daily   First degree heart block    GERD (gastroesophageal reflux disease)    History of colon polyps    benign   History of  shingles    forehead --  dec 2016--  no residual pain   Osteoarthritis    Peripheral arterial disease [facility]) May 2016  (seen by dr berry)   L. ABI 0.58 with high grade L. SFA stenosis and occluded L. DP. R. ABI 0.48 with occluded R. SFA and occluded R. DP.   Pneumonia    hx of-as a child   Resistant hypertension    takes Coreg,HCTZ,and Micardis daily   Weakness    numbness and tingling    PAST SURGICAL HISTORY   Past Surgical History:  Procedure Laterality Date   Carotid Dopplers  03/2018    Normal LV size and function.  EF 55 to 60%.  No R WMA.  Impaired relaxation (GR 1 DD).  Moderate LA dilation.  Moderate aortic thickening and calcification-sclerosis with no stenosis.   CARPAL TUNNEL RELEASE Right 10/10/2015   Procedure: RIGHT HAND CARPAL TUNNEL RELEASE;  Surgeon: Iran Planas, MD;  Location: [place];  Service: Orthopedics;  Laterality: Right;   COLONOSCOPY     CORONARY ANGIOPLASTY     3 stents   CORONARY ARTERY BYPASS GRAFT  05/22/2010  dr Lucianne Lei tright   x 5 (LIMA-LAD, SVG- RI,. SVG-OM1-OM2, SVG-dRCA)   DECOMPRESSIVE LUMBAR LAMINECTOMY LEVEL 1 N/A 03/08/2016   Procedure: Lumbar 3-5 decompression with in situ fusion;  Surgeon: Melina Schools, MD;  Location: [place];  Service: Orthopedics;  Laterality: N/A;   LEFT HEART CATH AND CORONARY ANGIOGRAPHY  05/15/2010   abnormal stress test-- 3 vessel CAD/  LVEDP=53mHg  (Dr. HDebara Pickettfor Dr,. HEllyn Hack    LEFT HEART CATHETERIZATION WITH CORONARY ANGIOGRAM N/A 04/02/2012   Procedure: LEFT HEART CATHETERIZATION WITH CORONARY ANGIOGRAM;  Surgeon: DLeonie Man MD;   [facility] Occluded SVG-rPDA & native RCA, 100% SVG-OM. Severe anastomotic lesion in SVG-RI.  Patent LIMA-LAD. Severe mid Cx-OM   LUMBAR LAMINECTOMY/DECOMPRESSION MICRODISCECTOMY Bilateral 11/16/2021   Procedure: L23 LAM/FORAM;  Surgeon: JNewman Pies MD;  [place]  Service: Neurosurgery;  Laterality: Bilateral;  3C   NM MYOVIEW LTD  03/25/2012   A) High Risk TM Myoview w/ apical & upper mid to basal Ing wall Scar w/ significant iscemia Inf -> Inf-Lat from apex to base/  severe LV dysfxn - infero-apical dyskinesis and HK in Inf & Lat wall, EF 33%; b) large-size, mod-intensity ~ reversible perfusion defect in Inf Wall suggesting ischemia. EF 34%. Global HK & Severe Inf HK to AK. Also - horizontal ST depression Inf-Lat. HIGH RISK   NM MYOVIEW LTD  04/19/2021   LOW RISK.  EF 53%.  Fixed inferior perfusion defect (with normal wall motion-suspect artifact/diaphragmatic attenuation).   PERCUTANEOUS CORONARY STENT INTERVENTION (PCI-S) N/A 04/08/2012   Procedure: PERCUTANEOUS CORONARY STENT INTERVENTION (PCI-S);  Surgeon: DLeonie Man MD;  [facility] Promus Premier DES 2.25 mm x 12 mm - Mid Cx-OM1; Promus Premier DES 2.5 mm x 16 mm -- anastomotic SVG-RI (graft into native)   TOTAL KNEE ARTHROPLASTY Right 10/26/2010   TRANSTHORACIC ECHOCARDIOGRAM  08/21/2010   mild LVH, EF 45-50%, inferior hypokinesis,  grade 1 diastolic dysfunction/  moderate LAE/   mild MR/  trivial TR/  mild dilated RV   TRANSTHORACIC ECHOCARDIOGRAM  03/2018    Normal LV size and function.  EF 55 to 60%.  No R WMA.  Impaired relaxation (GR 1 DD).  Moderate LA dilation.  Moderate aortic thickening and calcification-sclerosis with no stenosis.   UMBILICAL HERNIA REPAIR  1980's  MEDICATIONS/ALLERGIES   Current Meds  Medication Sig   amLODipine (NORVASC) 10 MG tablet Take 1 tablet (10 mg total) by mouth daily.   atorvastatin (LIPITOR) 80 MG tablet Take 1 tablet (80 mg total) by mouth daily.    carvedilol (COREG) 25 MG tablet TAKE 1 TABLET (25 MG TOTAL) BY MOUTH 2 (TWO) TIMES DAILY. (Patient taking differently: Take 25 mg by mouth 2 (two) times daily with a meal.)   chlorthalidone (HYGROTON) 50 MG tablet TAKE 1 TABLET BY MOUTH EVERY DAY   Cholecalciferol (VITAMIN D3) 50 MCG (2000 UT) CAPS Take 2,000 Units by mouth daily.   clopidogrel (PLAVIX) 75 MG tablet Take 1 tablet (75 mg total) by mouth daily.   cyclobenzaprine (FLEXERIL) 5 MG tablet Take 1 tablet (5 mg total) by mouth 3 (three) times daily as needed for muscle spasms.   docusate sodium (COLACE) 100 MG capsule Take 1 capsule (100 mg total) by mouth 2 (two) times daily.   ezetimibe (ZETIA) 10 MG tablet Take 1 tablet (10 mg total) by mouth daily.   fish oil-omega-3 fatty acids 1000 MG capsule Take 1 g by mouth daily.   hydrocortisone cream 0.5 % Apply 1 Application topically daily as needed for itching.   Menthol, Topical Analgesic, (ABSORBINE PLUS JR EX) Apply 1 application  topically daily as needed (Pain).   metFORMIN (GLUCOPHAGE) 500 MG tablet Take 500 mg by mouth once a week.   naproxen sodium (ALEVE) 220 MG tablet Take 220 mg by mouth daily as needed (pain).   nitroGLYCERIN (NITROSTAT) 0.4 MG SL tablet Place 1 tablet (0.4 mg total) under the tongue every 5 (five) minutes as needed for chest pain.   oxyCODONE-acetaminophen (PERCOCET/ROXICET) 5-325 MG tablet Take 1-2 tablets by mouth every 4 (four) hours as needed for moderate pain.   telmisartan (MICARDIS) 80 MG tablet Take 1 tablet (80 mg total) by mouth daily.   triamcinolone cream (KENALOG) 0.1 % Apply 1 Application topically daily as needed (Rash).    Allergies  Allergen Reactions   No Known Allergies     SOCIAL HISTORY/FAMILY HISTORY   Reviewed in Epic:  Pertinent findings:  Social History   Tobacco Use   Smoking status: Former    Types: Cigarettes    Quit date: 09/26/1980    Years since quitting: 41.5   Smokeless tobacco: Never  Vaping Use   Vaping Use:  Never used  Substance Use Topics   Alcohol use: No   Drug use: No   Social History   Social History Narrative   He is a married father of 2. He exercises routinely at the [facility]    He quit smoking about 30 years ago and has social alcohol.     OBJCTIVE -PE, EKG, labs   Wt Readings from Last 3 Encounters:  03/12/22 220 lb 3.2 oz (99.9 kg)  11/16/21 210 lb (95.3 kg)  11/10/21 214 lb 6.4 oz (97.3 kg)   Physical Exam: BP 130/78   Pulse 64   Ht '6\' 2"'$  (1.88 m)   Wt 220 lb 3.2 oz (99.9 kg)   SpO2 98%   BMI 28.27 kg/m  Physical Exam Vitals reviewed.  Constitutional:      General: He is not in acute distress.    Appearance: Normal appearance. He is normal weight. He is not ill-appearing or toxic-appearing.     Comments: Well-nourished, well-groomed.  HENT:     Head: Normocephalic and atraumatic.  Neck:     Vascular: No carotid bruit or JVD.  Cardiovascular:     Rate and Rhythm: Normal rate and regular rhythm. Occasional Extrasystoles are present.    Chest Wall: PMI is not displaced.     Pulses: Intact distal pulses.     Heart sounds: S1 normal and S2 normal. Heart sounds are distant. No murmur heard.    No friction rub. No gallop.  Pulmonary:     Effort: Pulmonary effort is normal.     Breath sounds: Normal breath sounds. No wheezing, rhonchi or rales.  Chest:     Chest wall: No tenderness.  Musculoskeletal:        General: Normal range of motion.     Cervical back: Normal range of motion and neck supple.     Left lower leg: Edema (Trace) present.  Skin:    General: Skin is warm and dry.  Neurological:     General: No focal deficit present.     Mental Status: He is alert and oriented to person, place, and time.     Cranial Nerves: No cranial nerve deficit.  Psychiatric:        Mood and Affect: Mood normal.        Behavior: Behavior normal.        Thought Content: Thought content normal.        Judgment: Judgment normal.      Adult ECG Report  Rate: 64 ;   Rhythm: normal sinus rhythm, premature atrial contractions (PAC), and nonspecific ST and T wave changes. ;   Narrative Interpretation: Stable  Recent Labs: 02/28/2022 Na+ 136, K+ 4.8, Cl- 104, HCO3-21, BUN 28, Cr 1.48 (up from 1.08 September 2021), Glu 99, Ca2+ 9.4; Hgb A1c 6.3 TC 149, TG 94, HDL 48, LDL 83   Lab Results  Component Value Date   CHOL 148 09/19/2021   HDL 47 09/19/2021   LDLCALC 87 09/19/2021   TRIG 71 09/19/2021   CHOLHDL 3.1 09/19/2021   Lab Results  Component Value Date   CREATININE 1.73 (H) 11/10/2021   BUN 31 (H) 11/10/2021   NA 134 (L) 11/10/2021   K 4.0 11/10/2021   CL 103 11/10/2021   CO2 23 11/10/2021      Latest Ref Rng & Units 11/10/2021    3:18 PM 03/02/2016    9:46 AM 10/10/2015    7:05 AM  CBC  WBC 4.0 - 10.5 K/uL 7.3  6.1    Hemoglobin 13.0 - 17.0 g/dL 10.6  12.5  13.3   Hematocrit 39.0 - 52.0 % 31.8  38.0  39.0   Platelets 150 - 400 K/uL 192  145      Lab Results  Component Value Date   HGBA1C 6.0 (H) 11/10/2021   No results found for: "TSH"  ================================================== I spent a total of 28 minutes with the patient spent in direct patient consultation.  Additional time spent with chart review  / charting (studies, outside notes, etc): 15 min Total Time: 43 min  Current medicines are reviewed at length with the patient today.  (+/- concerns) none  Notice: This dictation was prepared with Dragon dictation along with smart phrase technology. Any transcriptional errors that result from this process are unintentional and may not be corrected upon review.  Studies Ordered:   Orders Placed This Encounter  Procedures   Hepatic function panel   Lipid panel   EKG 12-Lead   Meds ordered this encounter  Medications   ezetimibe (ZETIA) 10 MG tablet    Sig: Take 1 tablet (10 mg total)  by mouth daily.    Dispense:  90 tablet    Refill:  3    Patient Instructions / Medication Changes & Studies & Tests Ordered   Patient  Instructions  Medication Instructions:   Start taking Zetia ( Ezetimibe ) 10 mg daily   *If you need a refill on your cardiac medications before your next appointment, please call your pharmacy*   Lab Work:in March 2024 Lipid Liver panel   If you have labs (blood work) drawn today and your tests are completely normal, you will receive your results only by: MyChart Message (if you have MyChart) OR A paper copy in the mail If you have any lab test that is abnormal or we need to change your treatment, we will call you to review the results.   Testing/Procedures: Not needed   Follow-Up: At [facility], you and your health needs are our priority.  As part of our continuing mission to provide you with exceptional heart care, we have created designated Provider Care Teams.  These Care Teams include your primary Cardiologist (physician) and Advanced Practice Providers (APPs -  Physician Assistants and Nurse Practitioners) who all work together to provide you with the care you need, when you need it.  We recommend signing up for the patient portal called "MyChart".  Sign up information is provided on this After Visit Summary.  MyChart is used to connect with patients for Virtual Visits (Telemedicine).  Patients are able to view lab/test results, encounter notes, upcoming appointments, etc.  Non-urgent messages can be sent to your provider as well.   To learn more about what you can do with MyChart, go to [email or web address].    Your next appointment:   8 month(s)  The format for your next appointment:   In Person  Provider:   Glenetta Hew, MD     Billy Man, MD, MS Glenetta Hew, M.D., M.S. Interventional Cardiologist  [place]  Pager # [phone] Phone # [phone] [address] [place], [place] [address]   Thank you for choosing [facility]!!

## 2022-03-18 NOTE — Assessment & Plan Note (Signed)
Myoview in March 2023 was negative for ischemia.  There was a suggestion of inferior perfusion defect which was thought to be more consistent with diaphragmatic attenuation.  EF was normal with no wall motion abnormality.  No active angina symptoms.  Has been stable overall.  Lipids not quite at goal which we will be addressing.  Blood pressure finally appears to be controlled.  Plan: Continue high-dose 25 mg twice daily carvedilol along with amlodipine 10 mg daily for antianginal benefit. He is also on 80 mg Micardis along with 50 mg chlorthalidone for afterload reduction He is on high-dose 80 mg atorvastatin along with fish oil. ->  Would lipids not quite at goal but LDL down to 83, will try Zetia 10 mg and then reassess labs in March.  If not at goal may want to consider either Nexletol plus or minus converting to PCSK9 inhibitor. He is on monotherapy Plavix maintenance without any bleeding issues. Okay to hold Plavix 5 to 7 days preop for surgeries or procedures.

## 2022-03-18 NOTE — Assessment & Plan Note (Signed)
Remarkably, doing well with no active angina symptoms.  He really has not had symptoms regardless of whether he has had stenoses or not. Most recent Myoview was nonischemic.

## 2022-03-18 NOTE — Assessment & Plan Note (Signed)
Blood pressure is a bit hard to control.  Now currently controlled on high-dose chlorthalidone 50 mg daily along with 80 mg Micardis, 2000 twice daily carvedilol and 10 mg amlodipine.  Continue current regimen.  Hopefully he can get back into his exercise regimen to lose back some weight which will also help.

## 2022-03-18 NOTE — Assessment & Plan Note (Signed)
No active claudication symptoms.  Continue risk factor modification

## 2022-03-18 NOTE — Assessment & Plan Note (Signed)
On long-term Plavix monotherapy for maintenance.  He is far enough out from most recent PCI to hold Plavix if necessary 5 to 7 days preop for surgeries or procedures.

## 2022-03-18 NOTE — Assessment & Plan Note (Addendum)
A1c thankfully went down below 6.3 so he still back in the prediabetes range on no diabetes medications.  Unfortunately, his LDL still not at goal.  He is on high-dose 80 mg atorvastatin along with fish oil. ->  Would lipids not quite at goal but LDL down to 83, will try Zetia 10 mg and then reassess labs in March.  If not at goal may want to consider either Nexletol plus or minus converting to PCSK9 inhibitor.  Would like to consider SGLT2 inhibitor for mild diuresis effect and glycemic control.  Defer to PCP but low threshold to make adjustment.

## 2022-03-18 NOTE — Assessment & Plan Note (Signed)
Most recent EF by echo and Myoview were pretty much back into the low normal range.  No active CHF symptoms.  Trivial HFpEF symptoms but did have some edema.  Seems to be controlled now chlorthalidone. No real PND or orthopnea.  Aggressive BP control with high doses of amlodipine, carvedilol and telmisartan as well as chlorthalidone...   With borderline glycemic control-A1c ranging between 6.3-6.6, low threshold to consider SGLT2 inhibitor.

## 2022-03-19 ENCOUNTER — Ambulatory Visit (HOSPITAL_COMMUNITY)
Admission: RE | Admit: 2022-03-19 | Discharge: 2022-03-19 | Disposition: A | Discharge status: Home / Self Care | Payer: Medicare Other | Source: Ambulatory Visit | Attending: Otolaryngology | Admitting: Otolaryngology

## 2022-03-19 DIAGNOSIS — H9312 Tinnitus, left ear: Secondary | ICD-10-CM

## 2022-03-19 MED ORDER — GADOBUTROL 1 MMOL/ML IV SOLN
10.0000 mL | Freq: Once | INTRAVENOUS | Status: AC | PRN
  Administered 2022-03-19: 10 mL via INTRAVENOUS

## 2022-03-31 ENCOUNTER — Other Ambulatory Visit: Payer: Self-pay | Admitting: Cardiology

## 2022-04-02 ENCOUNTER — Other Ambulatory Visit (HOSPITAL_COMMUNITY): Payer: Self-pay | Admitting: Otolaryngology

## 2022-04-02 DIAGNOSIS — H93A9 Pulsatile tinnitus, unspecified ear: Secondary | ICD-10-CM

## 2022-04-06 ENCOUNTER — Other Ambulatory Visit: Payer: Self-pay | Admitting: *Deleted

## 2022-04-06 DIAGNOSIS — I6523 Occlusion and stenosis of bilateral carotid arteries: Secondary | ICD-10-CM

## 2022-04-16 ENCOUNTER — Ambulatory Visit: Payer: Medicare Other | Admitting: Physician Assistant

## 2022-04-16 ENCOUNTER — Ambulatory Visit (HOSPITAL_COMMUNITY)
Admission: RE | Admit: 2022-04-16 | Discharge: 2022-04-16 | Disposition: A | Discharge status: Home / Self Care | Payer: Medicare Other | Source: Ambulatory Visit | Attending: Surgery | Admitting: Surgery

## 2022-04-16 VITALS — BP 127/62 | HR 65 | Temp 98.0°F | Resp 20 | Ht 74.0 in | Wt 220.4 lb

## 2022-04-16 DIAGNOSIS — I6523 Occlusion and stenosis of bilateral carotid arteries: Secondary | ICD-10-CM | POA: Diagnosis not present

## 2022-04-16 NOTE — Progress Notes (Signed)
Office Note     CC:  follow up Requesting Provider:  Neale Burly, MD  HPI: [name] is a 82 y.o. (02/28/1940) male who presents for routine follow up for carotid artery stenosis. He has known left ICA stenosis of 40-59% on duplex follow up . He has no history of TIA or Stroke. He does report hearing and feeling his heart beating in his left ear. He has had recent MRI and is scheduled to have CTA by Dr. Benjamine Mola with ENT for further evaluation. He says that it occurs more so at night before bed but quickly resolves. Often times it is positional and improved with ear drops and placing cotton ball in his left ear. He otherwise denies any visual changes, slurred speech, facial drooping, unilateral upper or lower extremity weakness or numbness. He does not have any claudication symptoms or rest pain. No tissue loss.   The pt is on a statin for cholesterol management.  The pt is on a daily aspirin.   Other AC:  Plavix The pt is on CCB, ARB for hypertension.   The pt is diabetic Tobacco hx:  Former  Past Medical History:  Diagnosis Date   CAD, multiple vessel 05/2010; 03/2012   Dr. Ellyn Hack (Cardiologist): For Abnormal ST: CABG X 5; cath 03/2012 --> LIMA-LAD - patent, SVG-Ramus - anastamotic 80% - DES stent; SVG-RCA & SVG-OM1-OM2-- occluded; DES Stent to Cx-OM1   Cardiomyopathy, ischemic- moderate LVD (EF of roughly 45%) at cath 2/143 11/03/2012   Resolved - f/u Echo with EF 55-60%   Carotid artery narrowings 03/2018   Moderate L ICA 40-59%   Carpal tunnel syndrome on right    Chronic back pain    spinal stenosis and scoliosis   DDD (degenerative disc disease), lumbosacral    Diabetes type 2, controlled ([place])    takes Metformin once a wk   DJD (degenerative joint disease) 9/12   Rt TKR   Dyslipidemia    takes Simvastatin daily   First degree heart block    GERD (gastroesophageal reflux disease)    History of colon polyps    benign   History of shingles    forehead --  dec 2016--   no residual pain   Osteoarthritis    Peripheral arterial disease ([place]) May 2016  (seen by dr berry)   L. ABI 0.58 with high grade L. SFA stenosis and occluded L. DP. R. ABI 0.48 with occluded R. SFA and occluded R. DP.   Pneumonia    hx of-as a child   Resistant hypertension    takes Coreg,HCTZ,and Micardis daily   Weakness    numbness and tingling    Past Surgical History:  Procedure Laterality Date   Carotid Dopplers  03/2018    Normal LV size and function.  EF 55 to 60%.  No R WMA.  Impaired relaxation (GR 1 DD).  Moderate LA dilation.  Moderate aortic thickening and calcification-sclerosis with no stenosis.   CARPAL TUNNEL RELEASE Right 10/10/2015   Procedure: RIGHT HAND CARPAL TUNNEL RELEASE;  Surgeon: Iran Planas, MD;  Location: [place];  Service: Orthopedics;  Laterality: Right;   COLONOSCOPY     CORONARY ANGIOPLASTY     3 stents   CORONARY ARTERY BYPASS GRAFT  05/22/2010  dr Lucianne Lei tright   x 5 (LIMA-LAD, SVG- RI,. SVG-OM1-OM2, SVG-dRCA)   DECOMPRESSIVE LUMBAR LAMINECTOMY LEVEL 1 N/A 03/08/2016   Procedure: Lumbar 3-5 decompression with in situ fusion;  Surgeon: Melina Schools, MD;  Location: [place];  Service: Orthopedics;  Laterality: N/A;   LEFT HEART CATH AND CORONARY ANGIOGRAPHY  05/15/2010   abnormal stress test-- 3 vessel CAD/  LVEDP=42mHg  (Dr. HDebara Pickettfor Dr,. HEllyn Hack    LEFT HEART CATHETERIZATION WITH CORONARY ANGIOGRAM N/A 04/02/2012   Procedure: LEFT HEART CATHETERIZATION WITH CORONARY ANGIOGRAM;  Surgeon: DLeonie Man MD;  [facility]: Occluded SVG-rPDA & native RCA, 100% SVG-OM. Severe anastomotic lesion in SVG-RI.  Patent LIMA-LAD. Severe mid Cx-OM   LUMBAR LAMINECTOMY/DECOMPRESSION MICRODISCECTOMY Bilateral 11/16/2021   Procedure: L23 LAM/FORAM;  Surgeon: JNewman Pies MD;  [place]  Service: Neurosurgery;  Laterality: Bilateral;  3C   NM MYOVIEW LTD  03/25/2012   A) High Risk TM Myoview w/ apical & upper mid to basal Ing  wall Scar w/ significant iscemia Inf -> Inf-Lat from apex to base/  severe LV dysfxn - infero-apical dyskinesis and HK in Inf & Lat wall, EF 33%; b) large-size, mod-intensity ~ reversible perfusion defect in Inf Wall suggesting ischemia. EF 34%. Global HK & Severe Inf HK to AK. Also - horizontal ST depression Inf-Lat. HIGH RISK   NM MYOVIEW LTD  04/19/2021   LOW RISK.  EF 53%.  Fixed inferior perfusion defect (with normal wall motion-suspect artifact/diaphragmatic attenuation).   PERCUTANEOUS CORONARY STENT INTERVENTION (PCI-S) N/A 04/08/2012   Procedure: PERCUTANEOUS CORONARY STENT INTERVENTION (PCI-S);  Surgeon: DLeonie Man MD;  [facility]: Promus Premier DES 2.25 mm x 12 mm - Mid Cx-OM1; Promus Premier DES 2.5 mm x 16 mm -- anastomotic SVG-RI (graft into native)   TOTAL KNEE ARTHROPLASTY Right 10/26/2010   TRANSTHORACIC ECHOCARDIOGRAM  08/21/2010   mild LVH, EF 45-50%, inferior hypokinesis,  grade 1 diastolic dysfunction/  moderate LAE/   mild MR/  trivial TR/  mild dilated RV   TRANSTHORACIC ECHOCARDIOGRAM  03/2018    Normal LV size and function.  EF 55 to 60%.  No R WMA.  Impaired relaxation (GR 1 DD).  Moderate LA dilation.  Moderate aortic thickening and calcification-sclerosis with no stenosis.   UMBILICAL HERNIA REPAIR  1980's    Social History   Socioeconomic History   Marital status: Married    Spouse name: Ruby   Number of children: 2   Years of education: Not on file   Highest education level: Not on file  Occupational History   Not on file  Tobacco Use   Smoking status: Former    Types: Cigarettes    Quit date: 09/26/1980    Years since quitting: 41.5    Passive exposure: Never   Smokeless tobacco: Never  Vaping Use   Vaping Use: Never used  Substance and Sexual Activity   Alcohol use: No   Drug use: No   Sexual activity: Not on file  Other Topics Concern   Not on file  Social History Narrative   He is a married father of 2. [facility]   He quit smoking about 30 years ago and has social alcohol.    Social Determinants of Health   Financial Resource Strain: Not on file  Food Insecurity: Not on file  Transportation Needs: Not on file  Physical Activity: Not on file  Stress: Not on file  Social Connections: Not on file  Intimate Partner Violence: Not on file    Family History  Problem Relation Age of Onset   Cancer Mother     Current Outpatient Medications  Medication Sig Dispense Refill  amLODipine (NORVASC) 10 MG tablet TAKE 1 TABLET BY MOUTH EVERY DAY 90 tablet 1   atorvastatin (LIPITOR) 80 MG tablet Take 1 tablet (80 mg total) by mouth daily. 90 tablet 3   carvedilol (COREG) 25 MG tablet TAKE 1 TABLET (25 MG TOTAL) BY MOUTH 2 (TWO) TIMES DAILY. (Patient taking differently: Take 25 mg by mouth 2 (two) times daily with a meal.) 180 tablet 1   chlorthalidone (HYGROTON) 50 MG tablet TAKE 1 TABLET BY MOUTH EVERY DAY 60 tablet 5   Cholecalciferol (VITAMIN D3) 50 MCG (2000 UT) CAPS Take 2,000 Units by mouth daily.     clopidogrel (PLAVIX) 75 MG tablet Take 1 tablet (75 mg total) by mouth daily. 90 tablet 3   cyclobenzaprine (FLEXERIL) 5 MG tablet Take 1 tablet (5 mg total) by mouth 3 (three) times daily as needed for muscle spasms. 30 tablet 0   docusate sodium (COLACE) 100 MG capsule Take 1 capsule (100 mg total) by mouth 2 (two) times daily. 30 capsule 0   ezetimibe (ZETIA) 10 MG tablet Take 1 tablet (10 mg total) by mouth daily. 90 tablet 3   fish oil-omega-3 fatty acids 1000 MG capsule Take 1 g by mouth daily.     hydrocortisone cream 0.5 % Apply 1 Application topically daily as needed for itching.     Menthol, Topical Analgesic, (ABSORBINE PLUS JR EX) Apply 1 application  topically daily as needed (Pain).     metFORMIN (GLUCOPHAGE) 500 MG tablet Take 500 mg by mouth once a week.     naproxen sodium (ALEVE) 220 MG tablet Take 220 mg by mouth daily as needed (pain).     nitroGLYCERIN (NITROSTAT) 0.4  MG SL tablet Place 1 tablet (0.4 mg total) under the tongue every 5 (five) minutes as needed for chest pain. 25 tablet 2   oxyCODONE-acetaminophen (PERCOCET/ROXICET) 5-325 MG tablet Take 1-2 tablets by mouth every 4 (four) hours as needed for moderate pain. 30 tablet 0   telmisartan (MICARDIS) 80 MG tablet TAKE 1 TABLET BY MOUTH EVERY DAY 90 tablet 1   triamcinolone cream (KENALOG) 0.1 % Apply 1 Application topically daily as needed (Rash).     No current facility-administered medications for this visit.    Allergies  Allergen Reactions   No Known Allergies      REVIEW OF SYSTEMS:  '[X]'$  denotes positive finding, '[ ]'$  denotes negative finding Cardiac  Comments:  Chest pain or chest pressure:    Shortness of breath upon exertion:    Short of breath when lying flat:    Irregular heart rhythm:        Vascular    Pain in calf, thigh, or hip brought on by ambulation:    Pain in feet at night that wakes you up from your sleep:     Blood clot in your veins:    Leg swelling:  X       Pulmonary    Oxygen at home:    Productive cough:     Wheezing:         Neurologic    Sudden weakness in arms or legs:     Sudden numbness in arms or legs:     Sudden onset of difficulty speaking or slurred speech:    Temporary loss of vision in one eye:     Problems with dizziness:         Gastrointestinal    Blood in stool:     Vomited blood:  Genitourinary    Burning when urinating:     Blood in urine:        Psychiatric    Major depression:         Hematologic    Bleeding problems:    Problems with blood clotting too easily:        Skin    Rashes or ulcers:        Constitutional    Fever or chills:      PHYSICAL EXAMINATION:  Vitals:   04/16/22 1253 04/16/22 1255  BP: (!) 125/58 127/62  Pulse: 65   Resp: 20   Temp: 98 F (36.7 C)   TempSrc: Temporal   SpO2: 100%   Weight: 220 lb 6.4 oz (100 kg)   Height: '6\' 2"'$  (1.88 m)     General:  WDWN in NAD; vital signs  documented above Gait: Normal HENT: WNL, normocephalic Pulmonary: normal non-labored breathing , without wheezing Cardiac: regular HR, without  Murmurs without carotid bruit Abdomen: soft, NT, no masses Vascular Exam/Pulses:  Right Left  Radial 2+ (normal) 2+ (normal)  DP 2+ (normal) 2+ (normal)  PT 2+ (normal) 2+ (normal)   Extremities: without ischemic changes, without Gangrene , without cellulitis; without open wounds; edema of bilateral lower extremities Musculoskeletal: no muscle wasting or atrophy  Neurologic: A&O X 3;  No focal weakness or paresthesias are detected Psychiatric:  The pt has Normal affect.   Non-Invasive Vascular Imaging:   Summary:  Right Carotid: Velocities in the right ICA are consistent with a 1-39% stenosis.   Left Carotid: Velocities in the left ICA are consistent with a 40-59% stenosis.   Vertebrals: Bilateral vertebral arteries demonstrate antegrade flow.     ASSESSMENT/PLAN:: 82 y.o. male here for follow up for carotid artery stenosis. He has not had any new neurological symptoms attributable to his carotid artery disease. He has been having some tinnitus.  This is being worked up by his ENT, Dr. Benjamine Mola. His duplex shows right ICA stenosis of 1-39%, left ICA stenosis of 40-59%, which is unchanged. I do not think his tinnitus is attributable to his carotid disease.  - continue Statin and Plavix - reviewed signs and symptoms of TIA/ CVA and patient and his wife understand that if these were to occur to seek immediate medical attention - He will follow up in 1 year with repeat Carotid duplex   Karoline Caldwell, PA-C [facility] [phone]  Clinic MD:   Trula Slade

## 2022-05-03 LAB — HEPATIC FUNCTION PANEL
ALT: 42 IU/L (ref 0–44)
AST: 27 IU/L (ref 0–40)
Albumin: 4.2 g/dL (ref 3.8–4.8)
Alkaline Phosphatase: 86 IU/L (ref 44–121)
Bilirubin Total: 0.7 mg/dL (ref 0.0–1.2)
Bilirubin, Direct: 0.17 mg/dL (ref 0.00–0.40)
Total Protein: 7 g/dL (ref 6.0–8.5)

## 2022-05-03 LAB — LIPID PANEL
Chol/HDL Ratio: 2.3 ratio (ref 0.0–5.0)
Cholesterol, Total: 114 mg/dL (ref 100–199)
HDL: 49 mg/dL (ref >39)
LDL Chol Calc (NIH): 52 mg/dL (ref 0–99)
Triglycerides: 59 mg/dL (ref 0–149)
VLDL Cholesterol Cal: 13 mg/dL (ref 5–40)

## 2022-06-04 ENCOUNTER — Other Ambulatory Visit: Payer: Self-pay | Admitting: Cardiology

## 2022-06-06 ENCOUNTER — Ambulatory Visit (HOSPITAL_COMMUNITY)
Admission: RE | Admit: 2022-06-06 | Discharge: 2022-06-06 | Disposition: A | Discharge status: Home / Self Care | Payer: Medicare Other | Source: Ambulatory Visit | Attending: Otolaryngology | Admitting: Otolaryngology

## 2022-06-06 DIAGNOSIS — H93A9 Pulsatile tinnitus, unspecified ear: Secondary | ICD-10-CM | POA: Diagnosis not present

## 2022-06-06 LAB — POCT I-STAT CREATININE: Creatinine, Ser: 1.8 mg/dL — ABNORMAL HIGH (ref 0.61–1.24)

## 2022-06-06 MED ORDER — IOHEXOL 350 MG/ML SOLN
75.0000 mL | Freq: Once | INTRAVENOUS | Status: AC | PRN
  Administered 2022-06-06: 60 mL via INTRAVENOUS

## 2022-08-05 ENCOUNTER — Other Ambulatory Visit: Payer: Self-pay | Admitting: Cardiology

## 2022-08-29 ENCOUNTER — Telehealth: Payer: Self-pay | Admitting: Cardiology

## 2022-08-29 NOTE — Telephone Encounter (Signed)
Patient wife spoke with me regarding patient.  Dizziness about a month ago.  He stopped taking some medications and feels better now.  He thought the atorvastatin was a BP med so had not been taking as often.  Advised it was a cholesterol medication and he needs to take every day to prevent further build up of plaque in arteries.   Amlodipine 10 mg Daily Carvedilol 25 mg Twice a day Chlorthalidone 50 mg Daily Telmisartan 80 mg Daily   Taking this every other day. Will continue this until speak with provider.  BP Today 125/56 3-4 days ago 125/50's Does not have any further readings. These are taken prior to his medications.  Advised to take BP Daily.  Take before medication and then 2 hours after medication.  Please advise if any parameters for BP meds or changes.

## 2022-08-29 NOTE — Telephone Encounter (Signed)
Pt c/o medication issue:  1. Name of Medication:   telmisartan (MICARDIS) 80 MG tablet  atorvastatin (LIPITOR) 80 MG tablet   2. How are you currently taking this medication (dosage and times per day)?   As prescribed  3. Are you having a reaction (difficulty breathing--STAT)? Dizziness  4. What is your medication issue?    Wife stated patient has been having dizziness and wants a call back from [employer] regarding next steps.

## 2022-08-30 NOTE — Telephone Encounter (Signed)
  Gave instructions to wife per the physician .  She writes them down and repeats back correctly.  She will continue a BP log and call next week or sooner if any issues

## 2022-08-30 NOTE — Telephone Encounter (Signed)
My recommendation will be to cut the chlorthalidone dose in half, and take 1/2 tablet of telmisartan as well.  Continue atorvastatin.  If SBP is less than 110, hold chlorthalidone  Bryan Lemma, MD

## 2022-09-24 ENCOUNTER — Ambulatory Visit: Payer: Medicare Other | Attending: Cardiology | Admitting: Cardiology

## 2022-09-24 ENCOUNTER — Encounter: Payer: Self-pay | Admitting: Cardiology

## 2022-09-24 VITALS — BP 132/50 | HR 67 | Ht 74.0 in | Wt 214.2 lb

## 2022-09-24 DIAGNOSIS — I739 Peripheral vascular disease, unspecified: Secondary | ICD-10-CM

## 2022-09-24 DIAGNOSIS — I25810 Atherosclerosis of coronary artery bypass graft(s) without angina pectoris: Secondary | ICD-10-CM | POA: Diagnosis not present

## 2022-09-24 DIAGNOSIS — I2510 Atherosclerotic heart disease of native coronary artery without angina pectoris: Secondary | ICD-10-CM

## 2022-09-24 DIAGNOSIS — Z7984 Long term (current) use of oral hypoglycemic drugs: Secondary | ICD-10-CM

## 2022-09-24 DIAGNOSIS — I1A0 Resistant hypertension: Secondary | ICD-10-CM | POA: Diagnosis not present

## 2022-09-24 DIAGNOSIS — E785 Hyperlipidemia, unspecified: Secondary | ICD-10-CM

## 2022-09-24 DIAGNOSIS — I255 Ischemic cardiomyopathy: Secondary | ICD-10-CM

## 2022-09-24 DIAGNOSIS — I6523 Occlusion and stenosis of bilateral carotid arteries: Secondary | ICD-10-CM

## 2022-09-24 DIAGNOSIS — E1169 Type 2 diabetes mellitus with other specified complication: Secondary | ICD-10-CM

## 2022-09-24 DIAGNOSIS — Z95828 Presence of other vascular implants and grafts: Secondary | ICD-10-CM

## 2022-09-24 NOTE — Progress Notes (Signed)
Cardiology Office Note:  .   Date:  09/29/2022  ID:  [name], DOB May 24, 1940, MRN [id number] PCP: Toma Deiters, MD  [facility] Providers Cardiologist:  Bryan Lemma, MD     Chief Complaint  Patient presents with   Follow-up    6 months.  Other than back pain getting worse, stable cardiac standpoint.   Coronary Artery Disease    No angina   History of Present Illness: .     [name] is a 82 y.o. male  with a PMH noted below who presents here for 6 month f/u  at the request of Hasanaj, Myra Gianotti, MD.  CAD-CABG-PCI; (resistant) HTN/HLD, DM-2 Initial cardiac evaluation was for Preoperative Risk Stratification back in April 2012.-> Nuclear stress test was grossly abnormal and cardiac cath showed MV CAD => referred for CABG. => never really had any active cardiac symptoms of chest tightness or pressure or exertional dyspnea. 2 years later - Abnormal surveillance Stress Test: Return the Cath Lab: Occluded SVG -RCA & SVG-OM1-OM 2 --> PCI of native LCx-OM1 and SVG-RI Myoview 04/2016 - read as HIGH RISK b/c reduced LVEF -- Echo confirmed relatively Normal EF. -- checked Echo.  No ischemia - Med Rx.  2-D echo 03/21/2018: EF ~55-60% with GRD 1 DD.  Moderate LA dilation.  Moderate aortic thickening/sclerosis no stenosis. Myoview 04/19/2021: LOW RISK.  EF 53%.  Fixed inferior perfusion defect (with normal wall motion-suspect artifact/diaphragmatic attenuation).  [name] was last seen on March 12, 2022 for annual follow-up, doing well.  Had recovered from his back surgery, but not always back to normal.  Still having some pain, but able to walk more.  Labs showed slight worsening creatinine but improvement in LDL and A1c. Added Zetia with plans to recheck lipids and chemistry panel. = >  excellent ; PCP rechecked labs in July     Subjective  INTERVAL HISTORY [name] returns for 59-month follow-up overall doing quite well.  Unfortunately, he says that  his back is back to as bad as it was if not worse.  He pretty much limits his walking to about 10 to 15 minutes and to avoid the back bothering him too much.  He denies any chest pain or pressure at rest or exertion, as he has never had.  He denies any PND or orthopnea.  He has some mild left lower swelling but thinks that this may be related to the fact that he is have been having a lot of radiculopathy down the leg.  Every now and then he wears support stockings and it seems to help.  Otherwise he really denies any active cardiac symptoms.  BP Log -for the most part blood pressures ranging anywhere from the 110s-130s/50s to 70s.  Actually pressure he has here today is maybe a little bit high for him and not upper level of average.  They are quite happy with his blood pressures on the current medications.  In fact his PCP reduced his ARB dose in half.  He is actually taking about your day I recommend that he takes it half tablet a day.  ROS:  Cardiovascular ROS: no chest pain or dyspnea on exertion positive for - Mild LLE swelling - goes down with elevation negative for - irregular heartbeat, orthopnea, palpitations, paroxysmal nocturnal dyspnea, rapid heart rate, shortness of breath, or syncope/.near syncope, TIA/amaurosis fugax; claudication/  Review of Systems - Musculoskeletal ROS: positive for - back pain & stiffness with some  L Leg radiculopathy.     Objective   Studies Reviewed: Marland Kitchen       Past CV  Procedure History:  Procedure Date   CORONARY ARTERY BYPASS GRAFT-x 5 (LIMA-LAD, SVG- RI,. SVG-OM1-OM2, SVG-dRCA) 05/22/2010        LEFT HEART CATH AND CORONARY ANGIOGRAPHY 05/15/2010   abnormal stress test-- 3 vessel CAD/  LVEDP=5mmHg  (Dr. Rennis Golden for Dr,. Herbie Baltimore) -> referred for CABG       LEFT HEART CATHETERIZATION WITH CORONARY ANGIOGRAM 04/02/2012   Procedure: LEFT HEART CATHETERIZATION WITH CORONARY ANGIOGRAM;  Surgeon: Marykay Lex, MD;  Location[facility] CATH LAB: Occluded SVG-rPDA &  native RCA, 100% SVG-OM. Severe anastomotic lesion in SVG-RI.  Patent LIMA-LAD. Severe mid Cx-OM       PERCUTANEOUS CORONARY STENT INTERVENTION (PCI-S) 04/08/2012   Procedure: PERCUTANEOUS CORONARY STENT INTERVENTION (PCI-S);  Surgeon: Marykay Lex, MD;  Location[facility] CATH LAB: Promus Premier DES 2.25 mm x 12 mm - Mid Cx-OM1; Promus Premier DES 2.5 mm x 16 mm -- anastomotic SVG-RI (graft into native)       NM MYOVIEW LTD 04/19/2021   LOW RISK.  EF 53%.  Fixed inferior perfusion defect (with normal wall motion-suspect artifact/diaphragmatic attenuation).        TRANSTHORACIC ECHOCARDIOGRAM 03/2018    Normal LV size and function.  EF 55 to 60%.  No R WMA.  Impaired relaxation (GR 1 DD).  Moderate LA dilation.  Moderate aortic thickening and calcification-sclerosis with no stenosis.    Carotid Dopplers 03/2018    Normal LV size and function.  EF 55 to 60%.  No R WMA.  Impaired relaxation (GR 1 DD).  Moderate LA dilation.  Moderate aortic thickening and calcification-sclerosis with no stenosis.    Risk Assessment/Calculations:            Physical Exam:   VS:  BP (!) 132/50   Pulse 67   Ht 6\' 2"  (1.88 m)   Wt 214 lb 3.2 oz (97.2 kg)   SpO2 98%   BMI 27.50 kg/m    Wt Readings from Last 3 Encounters:  09/24/22 214 lb 3.2 oz (97.2 kg)  04/16/22 220 lb 6.4 oz (100 kg)  03/12/22 220 lb 3.2 oz (99.9 kg)    GEN: Well nourished, well developed in no acute distress; healthy-appearing.  Well-well-groomed. NECK: No JVD; No carotid bruits CARDIAC: Somewhat distant heart sounds, with normal S1, S2; RRR with minimal ectopy. No murmurs, rubs, gallops RESPIRATORY:  Clear to auscultation without rales, wheezing or rhonchi ; nonlabored, good air movement. ABDOMEN: Soft, non-tender, non-distended EXTREMITIES: Trivial LLE edema; No deformity   Lab Results  Component Value Date   NA 134 (L) 11/10/2021   CL 103 11/10/2021   K 4.0 11/10/2021   CO2 23 11/10/2021   BUN 31 (H) 11/10/2021   CREATININE  1.80 (H) 06/06/2022   GFRNONAA 40 (L) 11/10/2021   CALCIUM 9.7 11/10/2021   ALBUMIN 4.2 05/02/2022   GLUCOSE 126 (H) 11/10/2021  After initiation of Zetia. Lab Results  Component Value Date   CHOL 114 05/02/2022   HDL 49 05/02/2022   LDLCALC 52 05/02/2022   TRIG 59 05/02/2022   CHOLHDL 2.3 05/02/2022   Lab Results  Component Value Date   HGBA1C 6.0 (H) 11/10/2021      ASSESSMENT AND PLAN: .    Problem List Items Addressed This Visit       Cardiology Problems   Resistant hypertension (Chronic)    BP log looks great.  Much better controlled now on current meds.  I did recommend that he take his Micardis 1/2 tablet daily as opposed to taking it every other day as he has been doing. Continue current dose chlorthalidone and amlodipine as well as carvedilol.  In the future, I would probably reduce his chlorthalidone dose back to 25 mg prior to reducing his ARB dose further.      Peripheral arterial disease (HCC) (Chronic)    No active claudication symptoms.  Really more limited by back pain than anything else.  Continue restrictive modification and walking as tolerated.      Carotid artery narrowings (Chronic)    Continue risk factor modification.      Cardiomyopathy, ischemic-EF improved from 45% post CABG up to 55-60% (Chronic)    Seems to be resolved with most recent echo and Myoview EF is being back to normal.  Trivial HFpEF symptoms with minimal edema I think is probably more related to venous insufficiency.  On stable doses of carvedilol and chlorthalidone along with somewhat unusual ambulatory dosing of Micardis.  I recommend that he take this every day and half as opposed to every other day.  A1c is now 6.0.  Hold off on SGLT2 order for now.  Would be next option.      CAD, CABG X 18 May 2010, progression at cath 04/02/12 -- status post PCI to SVG-RI & native Cx-OM. - Primary (Chronic)    Most recent Myoview and March 2023 was nonischemic.  There is a fixed defect  but stable.  He never has any symptoms, therefore we have been monitoring with intermittent nuclear stress testing.  However now that he is over 82, we will need to discuss whether he would want further testing which would not be due until 2027.  Plan:  Continue 25 mg twice daily carvedilol, 10 mg of amlodipine for antianginal benefit. He is also on ARB which is now on a reduced dose along with chlorthalidone. On combination of 80 mg atorvastatin 10 mg Zetia with well-controlled lipids.  He also takes fish oil. He is on Plavix monotherapy with no major bleeding issues. Okay to hold Plavix 5 to 7 days preop for surgeries or procedures.       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 Status post PCI to SVG-RI (Chronic)    No active anginal symptoms.  Most recent Myoview was nonischemic.        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  Okay to hold 5 to 7 days preop for surgeries or procedures.      Dyslipidemia associated with type 2 diabetes mellitus (HCC) (Chronic)    A1c down to 6.0 which is good.  Also lipids recently checked showed notable improvement of LDL down to 52.  Continue current dose of atorvastatin and Zetia along with fish oil.  Glycemic control she has been pretty well-controlled and not on any medications..             Dispo: Return in about 6 months (around 03/27/2023).  Total time spent: 22 min spent with patient + 15 min spent charting = 37 min    Signed, Marykay Lex, MD, MS Bryan Lemma, M.D., M.S. Interventional Cardiologist  [facility]  Pager # [phone] Phone # [phone] [address] [place], [place] [address]

## 2022-09-24 NOTE — Patient Instructions (Signed)
Medication Instructions:   No changes *If you need a refill on your cardiac medications before your next appointment, please call your pharmacy*   Lab Work: Not needed    Testing/Procedures: Not needed   Follow-Up: At [facility], you and your health needs are our priority.  As part of our continuing mission to provide you with exceptional heart care, we have created designated Provider Care Teams.  These Care Teams include your primary Cardiologist (physician) and Advanced Practice Providers (APPs -  Physician Assistants and Nurse Practitioners) who all work together to provide you with the care you need, when you need it.  We recommend signing up for the patient portal called "MyChart".  Sign up information is provided on this After Visit Summary.  MyChart is used to connect with patients for Virtual Visits (Telemedicine).  Patients are able to view lab/test results, encounter notes, upcoming appointments, etc.  Non-urgent messages can be sent to your provider as well.   To learn more about what you can do with MyChart, go to [email or web address]://[email or web address].    Your next appointment:   6 month(s)  The format for your next appointment:   In Person  Provider:   David Harding, MD       

## 2022-09-25 ENCOUNTER — Other Ambulatory Visit: Payer: Self-pay | Admitting: Cardiology

## 2022-09-27 ENCOUNTER — Other Ambulatory Visit: Payer: Self-pay | Admitting: Cardiology

## 2022-09-29 ENCOUNTER — Encounter: Payer: Self-pay | Admitting: Cardiology

## 2022-09-29 NOTE — Assessment & Plan Note (Signed)
No active claudication symptoms.  Really more limited by back pain than anything else.  Continue restrictive modification and walking as tolerated.

## 2022-09-29 NOTE — Assessment & Plan Note (Signed)
Remains on Plavix monotherapy.  Okay to hold 5 to 7 days preop for surgeries or procedures.

## 2022-09-29 NOTE — Assessment & Plan Note (Signed)
Most recent Myoview and March 2023 was nonischemic.  There is a fixed defect but stable.  He never has any symptoms, therefore we have been monitoring with intermittent nuclear stress testing.  However now that he is over 82, we will need to discuss whether he would want further testing which would not be due until 2027.  Plan:  Continue 25 mg twice daily carvedilol, 10 mg of amlodipine for antianginal benefit. He is also on ARB which is now on a reduced dose along with chlorthalidone. On combination of 80 mg atorvastatin 10 mg Zetia with well-controlled lipids.  He also takes fish oil. He is on Plavix monotherapy with no major bleeding issues. Okay to hold Plavix 5 to 7 days preop for surgeries or procedures.

## 2022-09-29 NOTE — Assessment & Plan Note (Signed)
Seems to be resolved with most recent echo and Myoview EF is being back to normal.  Trivial HFpEF symptoms with minimal edema I think is probably more related to venous insufficiency.  On stable doses of carvedilol and chlorthalidone along with somewhat unusual ambulatory dosing of Micardis.  I recommend that he take this every day and half as opposed to every other day.  A1c is now 6.0.  Hold off on SGLT2 order for now.  Would be next option.

## 2022-09-29 NOTE — Assessment & Plan Note (Signed)
A1c down to 6.0 which is good.  Also lipids recently checked showed notable improvement of LDL down to 52.  Continue current dose of atorvastatin and Zetia along with fish oil.  Glycemic control she has been pretty well-controlled and not on any medications.Marland Kitchen

## 2022-09-29 NOTE — Assessment & Plan Note (Signed)
No active anginal symptoms.  Most recent Myoview was nonischemic.

## 2022-09-29 NOTE — Assessment & Plan Note (Signed)
Continue risk factor modification 

## 2022-09-29 NOTE — Assessment & Plan Note (Signed)
BP log looks great.  Much better controlled now on current meds.  I did recommend that he take his Micardis 1/2 tablet daily as opposed to taking it every other day as he has been doing. Continue current dose chlorthalidone and amlodipine as well as carvedilol.  In the future, I would probably reduce his chlorthalidone dose back to 25 mg prior to reducing his ARB dose further.

## 2023-02-15 ENCOUNTER — Telehealth (INDEPENDENT_AMBULATORY_CARE_PROVIDER_SITE_OTHER): Payer: Self-pay | Admitting: Otolaryngology

## 2023-02-15 NOTE — Telephone Encounter (Signed)
 Received a call from patient spouse wanting to confirm what appt and what MD. Confirmed that it [facility] and provider is TEOH. Also confirmed the appt date, time and location.   Date: 02/18/2023 Status: Sch  Time: 1:20 PM [address] [place], [place] [address]

## 2023-02-15 NOTE — Telephone Encounter (Signed)
 Patient decided to cancel appointment after confirming that am. Appt has been cancelled.  Date: 02/18/2023 Status: Can  Time: 1:20 PM

## 2023-02-18 ENCOUNTER — Ambulatory Visit (INDEPENDENT_AMBULATORY_CARE_PROVIDER_SITE_OTHER): Payer: Medicare Other

## 2023-03-02 ENCOUNTER — Other Ambulatory Visit: Payer: Self-pay | Admitting: Cardiology

## 2023-03-21 ENCOUNTER — Other Ambulatory Visit: Payer: Self-pay | Admitting: Cardiology

## 2023-04-15 ENCOUNTER — Encounter: Payer: Self-pay | Admitting: Cardiology

## 2023-04-15 ENCOUNTER — Ambulatory Visit: Payer: Medicare Other | Attending: Cardiology | Admitting: Cardiology

## 2023-04-15 VITALS — BP 138/66 | HR 70 | Ht 74.0 in | Wt 220.6 lb

## 2023-04-15 DIAGNOSIS — Z9861 Coronary angioplasty status: Secondary | ICD-10-CM | POA: Diagnosis not present

## 2023-04-15 DIAGNOSIS — E1169 Type 2 diabetes mellitus with other specified complication: Secondary | ICD-10-CM

## 2023-04-15 DIAGNOSIS — E785 Hyperlipidemia, unspecified: Secondary | ICD-10-CM

## 2023-04-15 DIAGNOSIS — I2510 Atherosclerotic heart disease of native coronary artery without angina pectoris: Secondary | ICD-10-CM | POA: Diagnosis not present

## 2023-04-15 DIAGNOSIS — I739 Peripheral vascular disease, unspecified: Secondary | ICD-10-CM

## 2023-04-15 DIAGNOSIS — I1A0 Resistant hypertension: Secondary | ICD-10-CM

## 2023-04-15 DIAGNOSIS — Z01810 Encounter for preprocedural cardiovascular examination: Secondary | ICD-10-CM | POA: Diagnosis not present

## 2023-04-15 DIAGNOSIS — I25810 Atherosclerosis of coronary artery bypass graft(s) without angina pectoris: Secondary | ICD-10-CM

## 2023-04-15 NOTE — Progress Notes (Unsigned)
 Cardiology Office Note:  .   Date:  04/17/2023  ID:  [name], DOB 17-Dec-1940, MRN [id number] PCP: Toma Deiters, MD  [facility] Providers Cardiologist:  Bryan Lemma, MD     No chief complaint on file.   Patient Profile: .     [name] is a borderline obese 83 y.o. male  with a PMH noted below who presents here for 74-month follow-up at the request of Hasanaj, Myra Gianotti, MD.  CAD-CABG-PCI; (resistant) HTN/HLD, DM-2 Initial cardiac evaluation was for Preoperative Risk Stratification back in April 2012.-> Nuclear stress test was grossly abnormal and cardiac cath showed MV CAD => referred for CABG. => never really had any active cardiac symptoms of chest tightness or pressure or exertional dyspnea. 2 years later - Abnormal surveillance Stress Test: Return the Cath Lab: Occluded SVG -RCA & SVG-OM1-OM 2 --> PCI of native LCx-OM1 and SVG-RI Myoview 04/2016 - read as HIGH RISK b/c reduced LVEF -- Echo confirmed relatively Normal EF. -- checked Echo.  No ischemia - Med Rx.  2-D echo 03/21/2018: EF ~55-60% with GRD 1 DD.  Moderate LA dilation.  Moderate aortic thickening/sclerosis no stenosis. Myoview 04/19/2021: LOW RISK.  EF 53%.  Fixed inferior perfusion defect (with normal wall motion-suspect artifact/diaphragmatic attenuation).  [name] was last seen on March 12, 2022 for annual follow-up, doing well.  Had recovered from his back surgery, but not always back to normal.  Still having some pain, but able to walk more.  Labs showed slight worsening creatinine but improvement in LDL and A1c. Added Zetia with plans to recheck lipids and chemistry panel. = >  excellent ; PCP rechecked labs in July    [name] was last seen on September 24, 2022 with his major complaint being back pain but no chest pain or pressure at rest or exertion.  No PND or orthopnea.  Minimal lower extremity edema but exacerbated by his radiculopathy.  Blood pressures were ranging  anywhere from 110s to 130s/50s to 70s.  His ARB dose been reduced to one half tab.  My recommendation was to avoid further reduction of the ARB and preferentially reduce chlorthalidone if necessary.  Subjective  Discussed the use of AI scribe software for clinical note transcription with the patient, who gave verbal consent to proceed.  History of Present Illness   [name] is an 83 year old male with a history of coronary artery disease who presents for a routine cardiology follow-up.  He has been under regular cardiology care since undergoing open heart surgery in 2006. He has a history of coronary artery disease and underwent a heart catheterization before 2016. His last stress test was in 2023, and he remains asymptomatic with no shortness of breath or chest pain. No chest pain, pressure, or tightness. No shortness of breath, heart palpitations, or dizziness. He has not had any swelling in his legs and does not consistently wear compression socks, only using them when sitting or standing for long periods.  His blood pressure at home is approximately 140/100 mmHg. His blood pressure medication was previously reduced due to concerns about low blood pressure. No recent episodes of dizziness or lightheadedness.  He is currently taking metformin once a week for diabetes management. A recent blood sugar reading was 73 mg/dL and an W2N of 7%. His appetite has decreased with age, which may affect his blood sugar levels.  He exercises regularly, going to the gym about three times a  week, and denies any pain in his calves or thighs during exercise. He sleeps on two pillows due to neck discomfort but denies waking up due to breathing difficulties.         Objective   Current Meds  Medication Sig   atorvastatin (LIPITOR) 80 MG tablet TAKE 1 TABLET BY MOUTH EVERY DAY   carvedilol (COREG) 25 MG tablet TAKE 1 TABLET (25 MG TOTAL) BY MOUTH 2 (TWO) TIMES DAILY. (Patient taking differently: Take  25 mg by mouth 2 (two) times daily with a meal.)   chlorthalidone (HYGROTON) 50 MG tablet TAKE 1 TABLET BY MOUTH EVERY DAY   clopidogrel (PLAVIX) 75 MG tablet TAKE 1 TABLET BY MOUTH EVERY DAY   ezetimibe (ZETIA) 10 MG tablet TAKE 1 TABLET BY MOUTH EVERY DAY   fish oil-omega-3 fatty acids 1000 MG capsule Take 1 g by mouth daily.   metFORMIN (GLUCOPHAGE) 500 MG tablet Take 500 mg by mouth once a week.   nitroGLYCERIN (NITROSTAT) 0.4 MG SL tablet Place 1 tablet (0.4 mg total) under the tongue every 5 (five) minutes as needed for chest pain.   telmisartan (MICARDIS) 80 MG tablet TAKE 1 TABLET BY MOUTH EVERY DAY   [Renewed] amLODipine (NORVASC) 10 MG tablet TAKE 1 TABLET BY MOUTH EVERY DAY    Cholecalciferol (VITAMIN D3) 50 MCG (2000 UT) CAPS Take 2,000 Units by mouth daily.   PRN's  cyclobenzaprine (FLEXERIL) 5 MG tablet Take 1 tablet (5 mg total) by mouth 3 (three) times daily as needed for muscle spasms.   docusate sodium (COLACE) 100 MG capsule Take 1 capsule (100 mg total) by mouth 2 (two) times daily.    naproxen sodium (ALEVE) 220 MG tablet Take 220 mg by mouth daily as needed (pain).    oxyCODONE-acetaminophen (PERCOCET/ROXICET) 5-325 MG tablet Take 1-2 tablets by mouth every 4 (four) hours as needed for moderate pain.    Studies Reviewed: Marland Kitchen   EKG Interpretation Date/Time:  Monday April 15 2023 15:04:12 EST Ventricular Rate:  70 PR Interval:  196 QRS Duration:  102 QT Interval:  386 QTC Calculation: 416 R Axis:   -1  Text Interpretation: Sinus rhythm with occasional Premature ventricular complexes Minimal voltage criteria for LVH, may be normal variant ( R in aVL ) Inferior infarct (cited on or before 03-Apr-2010) When compared with ECG of 10-Nov-2021 15:16, Premature ventricular complexes are now Present Serial changes of Inferior infarct Present Confirmed by Bryan Lemma ([id number]) on 04/15/2023 3:06:58 PM    Myoview (04/19/2021): LOW RISK.  EF 50 to 54%.  Mildly large cavity size.   No ischemia or infarction.  Lab Results  Component Value Date   CHOL 114 05/02/2022   HDL 49 05/02/2022   LDLCALC 52 05/02/2022   TRIG 59 05/02/2022   CHOLHDL 2.3 05/02/2022   Lab Results  Component Value Date   HGBA1C 6.0 (H) 11/10/2021    Lab Results  Component Value Date   NA 134 (L) 11/10/2021   K 4.0 11/10/2021   CREATININE 1.80 (H) 06/06/2022   GFRNONAA 40 (L) 11/10/2021   GLUCOSE 126 (H) 11/10/2021   Risk Assessment/Calculations:            Mr. Girardin perioperative risk of a major cardiac event is 0.9% according to the Revised Cardiac Risk Index (RCRI).  Therefore, he is at low risk for perioperative complications.   His functional capacity is excellent at 8.23 METs according to the Duke Activity Status Index (DASI). Recommendations: According to ACC/AHA guidelines, no  further cardiovascular testing needed.  The patient may proceed to surgery at acceptable risk.   Antiplatelet and/or Anticoagulation Recommendations: Clopidogrel (Plavix) can be held for 7 days prior to his surgery and resumed as soon as possible post op. Not on anticoagulation    Physical Exam:   VS:  BP 138/66 (BP Location: Left Arm, Patient Position: Sitting, Cuff Size: Normal)   Pulse 70   Ht 6\' 2"  (1.88 m)   Wt 220 lb 9.6 oz (100.1 kg)   SpO2 99%   BMI 28.32 kg/m    Wt Readings from Last 3 Encounters:  04/15/23 220 lb 9.6 oz (100.1 kg)  09/24/22 214 lb 3.2 oz (97.2 kg)  04/16/22 220 lb 6.4 oz (100 kg)    GEN: Healthy-Appearing.  Well-Groomed.Well nourished; in no acute distress;  NECK: No JVD; No carotid bruits CARDIAC: Normal S1, S2; RRR, no murmurs, rubs, gallops RESPIRATORY:  Clear to auscultation without rales, wheezing or rhonchi ; nonlabored, good air movement. ABDOMEN: Soft, non-tender, non-distended EXTREMITIES:  No edema; No deformity      ASSESSMENT AND PLAN: .    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 Status post PCI to SVG-RI (Chronic)   Has had vein graft PCI.  Remains on Plavix as noted.  Otherwise on stable regimen.      CAD, CABG X 18 May 2010, progression at cath 04/02/12 -- status post PCI to SVG-RI & native Cx-OM. - Primary (Chronic)   Stable, no chest pain or shortness of breath. Regular exercise regimen. Last stress test in 2023. -Continue current management:  On the combination of amlodipine 10 mg daily, and carvedilol 25 mg twice daily for antianginal and BP effect along with Micardis 80 mg daily for afterload reduction On combination of fish oil, Zetia 10 mg, and 80 mg of atorvastatin for lipids, along with metformin 500 mg weekly for borderline diabetes. -Consider repeat stress test in the next 1-2 years.-Mostly because he has remained essentially asymptomatic despite having significantly abnormal stress test and cath results.      Peripheral arterial disease (HCC) (Chronic)   No active claudication symptoms. Continue GDMT for CAD Continue to recommend walking.      Relevant Orders   EKG 12-Lead (Completed)   Resistant hypertension (Chronic)   Borderline controlled blood pressure today.  Has been slightly elevated at home as well. Currently on max doses of carvedilol 25 mg twice daily, amlodipine 10 mg daily, chlorthalidone 50 mg daily and telmisartan 80 mg daily. -Continue current meds -Continue to monitor, but would have to probably consider spironolactone next.      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  -Continue combination of 80 mg atorvastatin, 10 mg Zetia and fish oil. -Apparently had recent lipid panel checked by PCP but not available.  Will try to get results.  Type 2 Diabetes Mellitus A1c at 7, some episodes of hypoglycemia.  Currently on once-weekly Metformin. -Suggest considering addition of Jardiance or Farxiga to regimen for potential heart benefits and improved A1c control.      Preop cardiovascular exam   Mr. Khawaja perioperative risk of a major cardiac event is 0.9% according to the Revised Cardiac  Risk Index (RCRI).  Therefore, he is at low risk for perioperative complications.   His functional capacity is excellent at 8.23 METs according to the Duke Activity Status Index (DASI).  Remains very active with no ongoing symptoms.   Spine surgery albeit high risk from on noncardiac standpoint, is not high risk from a cardiac standpoint. Recommendations: According to ACC/AHA guidelines, no further cardiovascular testing needed.  The patient may proceed to surgery at acceptable risk.   Antiplatelet and/or Anticoagulation Recommendations: Clopidogrel (Plavix) can be held for 7 days prior to his surgery and resumed as soon as possible post op. Not on anticoagulation  Based on these findings, recommendation is to proceed to the OR without any further cardiac evaluation.  Okay to hold Plavix 7 days preop.  Restart when safe postop.      S/P elective  PTCA / DES to SVG-RI and OM1 04/08/12 (Chronic)   PCI to vein graft and native artery on Plavix monotherapy.   He is far enough out post PCI to hold Plavix 5 to 7 days preop for surgeries or procedures.         -Carotid Dopplers scheduled for 05/07/2023. - Follow-Up: Return in about 6 months (around 10/16/2023) for [employer], Alternate 6 month follow-up with APP & MD.      Signed, Marykay Lex, MD, MS Bryan Lemma, M.D., M.S. Interventional Cardiologist  [facility]  Pager # [phone] Phone # [phone] [address] [place], [place] [address]

## 2023-04-15 NOTE — Patient Instructions (Addendum)
 Medication Instructions:  No changes  *If you need a refill on your cardiac medications before your next appointment, please call your pharmacy*   Lab Work: Not  Needed    Testing/Procedures:  Not needed  Follow-Up: At [facility], you and your health needs are our priority.  As part of our continuing mission to provide you with exceptional heart care, we have created designated Provider Care Teams.  These Care Teams include your primary Cardiologist (physician) and Advanced Practice Providers (APPs -  Physician Assistants and Nurse Practitioners) who all work together to provide you with the care you need, when you need it.     Your next appointment:    6 month(s)  The format for your next appointment:   In Person  Provider:    Edd Fabian NP  and then Bryan Lemma, MD 12 months

## 2023-04-16 ENCOUNTER — Other Ambulatory Visit: Payer: Self-pay

## 2023-04-16 DIAGNOSIS — I6523 Occlusion and stenosis of bilateral carotid arteries: Secondary | ICD-10-CM

## 2023-04-17 ENCOUNTER — Telehealth: Payer: Self-pay | Admitting: Cardiology

## 2023-04-17 NOTE — Assessment & Plan Note (Signed)
 Last lipid panel was March or 2024 LDL was 52 at that time.-due for follow-up labs.  -Continue combination of 80 mg atorvastatin, 10 mg Zetia and fish oil. -Apparently had recent lipid panel checked by PCP but not available.  Will try to get results.  Type 2 Diabetes Mellitus A1c at 7, some episodes of hypoglycemia. Currently on once-weekly Metformin. -Suggest considering addition of Jardiance or Farxiga to regimen for potential heart benefits and improved A1c control.

## 2023-04-17 NOTE — Assessment & Plan Note (Signed)
 Stable, no chest pain or shortness of breath. Regular exercise regimen. Last stress test in 2023. -Continue current management:  On the combination of amlodipine 10 mg daily, and carvedilol 25 mg twice daily for antianginal and BP effect along with Micardis 80 mg daily for afterload reduction On combination of fish oil, Zetia 10 mg, and 80 mg of atorvastatin for lipids, along with metformin 500 mg weekly for borderline diabetes. -Consider repeat stress test in the next 1-2 years.-Mostly because he has remained essentially asymptomatic despite having significantly abnormal stress test and cath results.

## 2023-04-17 NOTE — Telephone Encounter (Signed)
 Yeah.  I should be dictating his note today or tonight.  I will address that in the note.  Okay to hold Plavix for 7 days preop and 1 to 2 days postop.  Would not do any further testing preop.

## 2023-04-17 NOTE — Telephone Encounter (Signed)
   Patient Name: [name]  DOB: [birth year] MRN: [id number]  Primary Cardiologist: Bryan Lemma, MD  Chart reviewed as part of pre-operative protocol coverage. Given past medical history and time since last visit, based on ACC/AHA guidelines, [name] is at acceptable risk for the planned procedure without further cardiovascular testing.   Per Dr. Herbie Baltimore "Okay to hold Plavix for 7 days preop and 1 to 2 days postop."  I will route this recommendation to the requesting party via Epic fax function and remove from pre-op pool.  Please call with questions.  Denyce Robert, NP 04/17/2023, 3:05 PM

## 2023-04-17 NOTE — Assessment & Plan Note (Signed)
 Has had vein graft PCI.  Remains on Plavix as noted.  Otherwise on stable regimen.

## 2023-04-17 NOTE — Assessment & Plan Note (Signed)
 PCI to vein graft and native artery on Plavix monotherapy.   He is far enough out post PCI to hold Plavix 5 to 7 days preop for surgeries or procedures.

## 2023-04-17 NOTE — Telephone Encounter (Signed)
 My clinic note is done. Preop addressed.  DH

## 2023-04-17 NOTE — Assessment & Plan Note (Signed)
 No active claudication symptoms. Continue GDMT for CAD Continue to recommend walking.

## 2023-04-17 NOTE — Assessment & Plan Note (Signed)
 Mr. Golda perioperative risk of a major cardiac event is 0.9% according to the Revised Cardiac Risk Index (RCRI).  Therefore, he is at low risk for perioperative complications.   His functional capacity is excellent at 8.23 METs according to the Duke Activity Status Index (DASI).  Remains very active with no ongoing symptoms.   Spine surgery albeit high risk from on noncardiac standpoint, is not high risk from a cardiac standpoint. Recommendations: According to ACC/AHA guidelines, no further cardiovascular testing needed.  The patient may proceed to surgery at acceptable risk.   Antiplatelet and/or Anticoagulation Recommendations: Clopidogrel (Plavix) can be held for 7 days prior to his surgery and resumed as soon as possible post op. Not on anticoagulation  Based on these findings, recommendation is to proceed to the OR without any further cardiac evaluation.  Okay to hold Plavix 7 days preop.  Restart when safe postop.

## 2023-04-17 NOTE — Assessment & Plan Note (Addendum)
 Borderline controlled blood pressure today.  Has been slightly elevated at home as well. Currently on max doses of carvedilol 25 mg twice daily, amlodipine 10 mg daily, chlorthalidone 50 mg daily and telmisartan 80 mg daily. -Continue current meds -Continue to monitor, but would have to probably consider spironolactone next.

## 2023-04-17 NOTE — Telephone Encounter (Signed)
   Pre-operative Risk Assessment    Patient Name: [name]  DOB: 1941/01/24 MRN: [id number]   Date of last office visit: 04/15/2023 Date of next office visit: none   Request for Surgical Clearance    Procedure:   L5-S1  ESI to left  Date of Surgery:  Clearance TBD                                Surgeon:  Dr. Aileen Fass  Surgeon's Group or Practice Name:  [facility] Phone number:  [phone] Fax number:  [phone]   Type of Clearance Requested:   - Medical  - Pharmacy:  Hold Clopidogrel (Plavix) hold for 7 days prior   Type of Anesthesia:  Not Indicated   Additional requests/questions:    Sharen Hones   04/17/2023, 2:19 PM

## 2023-05-07 ENCOUNTER — Ambulatory Visit (INDEPENDENT_AMBULATORY_CARE_PROVIDER_SITE_OTHER): Admitting: Physician Assistant

## 2023-05-07 ENCOUNTER — Encounter: Payer: Self-pay | Admitting: Physician Assistant

## 2023-05-07 ENCOUNTER — Ambulatory Visit: Payer: Medicare Other

## 2023-05-07 ENCOUNTER — Ambulatory Visit (INDEPENDENT_AMBULATORY_CARE_PROVIDER_SITE_OTHER)

## 2023-05-07 DIAGNOSIS — I6523 Occlusion and stenosis of bilateral carotid arteries: Secondary | ICD-10-CM

## 2023-05-07 NOTE — Progress Notes (Signed)
 Office Note     CC:  follow up Requesting Provider:  Toma Deiters, MD  HPI: [name] is a 83 y.o. (September 23, 1940) male who presents for surveillance of carotid artery stenosis.  Duplex from last office visit in March 2024 demonstrated 40 to 59% stenosis of the left ICA and 1 to 39% stenosis of the right ICA.  He has not experienced any strokelike symptoms over the past year including slurring speech, changes in vision, or one-sided weakness.  He is on Plavix prescribed by his cardiologist for history of CAD with CABG and PCI.  He is on a daily statin.  He denies tobacco use.   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 Herbie Baltimore (Cardiologist): For Abnormal ST: CABG X 5; cath 03/2012 --> LIMA-LAD - patent, SVG-Ramus - anastamotic 80% - DES stent; SVG-RCA & SVG-OM1-OM2-- occluded; DES Stent to Cx-OM1   Cardiomyopathy, ischemic- moderate LVD (EF of roughly 45%) at cath 2/143 11/03/2012   Resolved - f/u Echo with EF 55-60%   Carotid artery narrowings 03/2018   Moderate L ICA 40-59%   Carpal tunnel syndrome on right    Chronic back pain    spinal stenosis and scoliosis   DDD (degenerative disc disease), lumbosacral    Diabetes type 2, controlled (HCC)    takes Metformin once a wk   DJD (degenerative joint disease) 9/12   Rt TKR   Dyslipidemia    takes Simvastatin daily   First degree heart block    GERD (gastroesophageal reflux disease)    History of colon polyps    benign   History of shingles    forehead --  dec 2016--  no residual pain   Osteoarthritis    Peripheral arterial disease (HCC) May 2016  (seen by dr berry)   L. ABI 0.58 with high grade L. SFA stenosis and occluded L. DP. R. ABI 0.48 with occluded R. SFA and occluded R. DP.   Pneumonia    hx of-as a child   Resistant hypertension    takes Coreg,HCTZ,and Micardis daily   Weakness    numbness and tingling    Past Surgical History:  Procedure Laterality Date   Carotid  Dopplers  03/2018    Normal LV size and function.  EF 55 to 60%.  No R WMA.  Impaired relaxation (GR 1 DD).  Moderate LA dilation.  Moderate aortic thickening and calcification-sclerosis with no stenosis.   CARPAL TUNNEL RELEASE Right 10/10/2015   Procedure: RIGHT HAND CARPAL TUNNEL RELEASE;  Surgeon: Bradly Bienenstock, MD;  Location: [facility];  Service: Orthopedics;  Laterality: Right;   COLONOSCOPY     CORONARY ANGIOPLASTY     3 stents   CORONARY ARTERY BYPASS GRAFT  05/22/2010  dr Zenaida Niece tright   x 5 (LIMA-LAD, SVG- RI,. SVG-OM1-OM2, SVG-dRCA)   DECOMPRESSIVE LUMBAR LAMINECTOMY LEVEL 1 N/A 03/08/2016   Procedure: Lumbar 3-5 decompression with in situ fusion;  Surgeon: Venita Lick, MD;  Location: [facility] OR;  Service: Orthopedics;  Laterality: N/A;   LEFT HEART CATH AND CORONARY ANGIOGRAPHY  05/15/2010   abnormal stress test-- 3 vessel CAD/  LVEDP=38mmHg  (Dr. Rennis Golden for Dr,. Herbie Baltimore)    LEFT HEART CATHETERIZATION WITH CORONARY ANGIOGRAM N/A 04/02/2012   Procedure: LEFT HEART CATHETERIZATION WITH CORONARY ANGIOGRAM;  Surgeon: Marykay Lex, MD;  Location: [facility] CATH LAB: Occluded SVG-rPDA & native RCA, 100% SVG-OM. Severe anastomotic lesion in SVG-RI.  Patent LIMA-LAD. Severe  mid Cx-OM   LUMBAR LAMINECTOMY/DECOMPRESSION MICRODISCECTOMY Bilateral 11/16/2021   Procedure: L23 LAM/FORAM;  Surgeon: Tressie Stalker, MD;  Location: [facility] OR;  Service: Neurosurgery;  Laterality: Bilateral;  3C   NM MYOVIEW LTD  03/25/2012   A) High Risk TM Myoview w/ apical & upper mid to basal Ing wall Scar w/ significant iscemia Inf -> Inf-Lat from apex to base/  severe LV dysfxn - infero-apical dyskinesis and HK in Inf & Lat wall, EF 33%; b) large-size, mod-intensity ~ reversible perfusion defect in Inf Wall suggesting ischemia. EF 34%. Global HK & Severe Inf HK to AK. Also - horizontal ST depression Inf-Lat. HIGH RISK   NM MYOVIEW LTD  04/19/2021   LOW RISK.  EF 53%.  Fixed inferior perfusion defect (with normal  wall motion-suspect artifact/diaphragmatic attenuation).   PERCUTANEOUS CORONARY STENT INTERVENTION (PCI-S) N/A 04/08/2012   Procedure: PERCUTANEOUS CORONARY STENT INTERVENTION (PCI-S);  Surgeon: Marykay Lex, MD;  Location: [facility] CATH LAB: Promus Premier DES 2.25 mm x 12 mm - Mid Cx-OM1; Promus Premier DES 2.5 mm x 16 mm -- anastomotic SVG-RI (graft into native)   TOTAL KNEE ARTHROPLASTY Right 10/26/2010   TRANSTHORACIC ECHOCARDIOGRAM  08/21/2010   mild LVH, EF 45-50%, inferior hypokinesis,  grade 1 diastolic dysfunction/  moderate LAE/   mild MR/  trivial TR/  mild dilated RV   TRANSTHORACIC ECHOCARDIOGRAM  03/2018    Normal LV size and function.  EF 55 to 60%.  No R WMA.  Impaired relaxation (GR 1 DD).  Moderate LA dilation.  Moderate aortic thickening and calcification-sclerosis with no stenosis.   UMBILICAL HERNIA REPAIR  1980's    Social History   Socioeconomic History   Marital status: Married    Spouse name: Ruby   Number of children: 2   Years of education: Not on file   Highest education level: Not on file  Occupational History   Not on file  Tobacco Use   Smoking status: Former    Current packs/day: 0.00    Types: Cigarettes    Quit date: 09/26/1980    Years since quitting: 42.6    Passive exposure: Never   Smokeless tobacco: Never  Vaping Use   Vaping status: Never Used  Substance and Sexual Activity   Alcohol use: No   Drug use: No   Sexual activity: Not on file  Other Topics Concern   Not on file  Social History Narrative   He is a married father of 2. He exercises routinely at the [facility]    He quit smoking about 30 years ago and has social alcohol.    Social Drivers of Corporate investment banker Strain: Not on file  Food Insecurity: Not on file  Transportation Needs: No Transportation Needs (12/18/2021)   Received from [facility], [facility]   [facility] - Transportation    Lack of Transportation (Medical): No    Lack of Transportation  (Non-Medical): No  Physical Activity: Not on file  Stress: Not on file  Social Connections: Not on file  Intimate Partner Violence: Not on file    Family History  Problem Relation Age of Onset   Cancer Mother     Current Outpatient Medications  Medication Sig Dispense Refill   atorvastatin (LIPITOR) 80 MG tablet TAKE 1 TABLET BY MOUTH EVERY DAY 90 tablet 3   carvedilol (COREG) 25 MG tablet TAKE 1 TABLET (25 MG TOTAL) BY MOUTH 2 (TWO) TIMES DAILY. (Patient taking differently: Take 25 mg by mouth  2 (two) times daily with a meal.) 180 tablet 1   chlorthalidone (HYGROTON) 50 MG tablet TAKE 1 TABLET BY MOUTH EVERY DAY 90 tablet 3   Cholecalciferol (VITAMIN D3) 50 MCG (2000 UT) CAPS Take 2,000 Units by mouth daily.     clopidogrel (PLAVIX) 75 MG tablet TAKE 1 TABLET BY MOUTH EVERY DAY 90 tablet 3   cyclobenzaprine (FLEXERIL) 5 MG tablet Take 1 tablet (5 mg total) by mouth 3 (three) times daily as needed for muscle spasms. 30 tablet 0   docusate sodium (COLACE) 100 MG capsule Take 1 capsule (100 mg total) by mouth 2 (two) times daily. 30 capsule 0   ezetimibe (ZETIA) 10 MG tablet TAKE 1 TABLET BY MOUTH EVERY DAY 90 tablet 0   fish oil-omega-3 fatty acids 1000 MG capsule Take 1 g by mouth daily.     hydrocortisone cream 0.5 % Apply 1 Application topically daily as needed for itching.     Menthol, Topical Analgesic, (ABSORBINE PLUS JR EX) Apply 1 application  topically daily as needed (Pain).     metFORMIN (GLUCOPHAGE) 500 MG tablet Take 500 mg by mouth once a week. (Patient not taking: Reported on 05/07/2023)     naproxen sodium (ALEVE) 220 MG tablet Take 220 mg by mouth daily as needed (pain).     nitroGLYCERIN (NITROSTAT) 0.4 MG SL tablet Place 1 tablet (0.4 mg total) under the tongue every 5 (five) minutes as needed for chest pain. 25 tablet 2   oxyCODONE-acetaminophen (PERCOCET/ROXICET) 5-325 MG tablet Take 1-2 tablets by mouth every 4 (four) hours as needed for moderate pain. (Patient not  taking: Reported on 05/07/2023) 30 tablet 0   telmisartan (MICARDIS) 80 MG tablet TAKE 1 TABLET BY MOUTH EVERY DAY 90 tablet 1   triamcinolone cream (KENALOG) 0.1 % Apply 1 Application topically daily as needed (Rash).     No current facility-administered medications for this visit.    Allergies  Allergen Reactions   No Known Allergies      REVIEW OF SYSTEMS:   [X]  denotes positive finding, [ ]  denotes negative finding Cardiac  Comments:  Chest pain or chest pressure:    Shortness of breath upon exertion:    Short of breath when lying flat:    Irregular heart rhythm:        Vascular    Pain in calf, thigh, or hip brought on by ambulation:    Pain in feet at night that wakes you up from your sleep:     Blood clot in your veins:    Leg swelling:         Pulmonary    Oxygen at home:    Productive cough:     Wheezing:         Neurologic    Sudden weakness in arms or legs:     Sudden numbness in arms or legs:     Sudden onset of difficulty speaking or slurred speech:    Temporary loss of vision in one eye:     Problems with dizziness:         Gastrointestinal    Blood in stool:     Vomited blood:         Genitourinary    Burning when urinating:     Blood in urine:        Psychiatric    Major depression:         Hematologic    Bleeding problems:    Problems with blood clotting  too easily:        Skin    Rashes or ulcers:        Constitutional    Fever or chills:      PHYSICAL EXAMINATION:  There were no vitals filed for this visit.  General:  WDWN in NAD; vital signs documented above Gait: Not observed HENT: WNL, normocephalic Pulmonary: normal non-labored breathing , without Rales, rhonchi,  wheezing Cardiac: regular HR Abdomen: soft, NT, no masses Skin: without rashes Vascular Exam/Pulses: symmetrical radial pulses Extremities: without ischemic changes, without Gangrene , without cellulitis; without open wounds;  Musculoskeletal: no muscle wasting  or atrophy  Neurologic: A&O X 3; CN grossly intact Psychiatric:  The pt has Normal affect.   Non-Invasive Vascular Imaging:   R ICA 1-39% stenosis L ICA 40-59% stenosis    ASSESSMENT/PLAN:: 83 y.o. male here for follow up for surveillance of carotid artery stenosis  Mr. Brodzinski is doing well overall.  He has not experienced any strokelike symptoms since last office visit.  Carotid duplex is relatively stable with 40 to 59% stenosis of the left ICA and 1 to 39% stenosis of the right ICA.  No indication for intervention of the asymptomatic left ICA stenosis.  Continue Plavix and statin daily.  We will repeat carotid duplex in another year.  He knows to seek immediate medical attention if he were to experience any strokelike symptoms.   Emilie Rutter, PA-C [facility] [phone]

## 2023-05-30 ENCOUNTER — Other Ambulatory Visit: Payer: Self-pay | Admitting: Cardiology

## 2023-06-29 ENCOUNTER — Other Ambulatory Visit: Payer: Self-pay | Admitting: Cardiology

## 2023-09-23 ENCOUNTER — Other Ambulatory Visit: Payer: Self-pay | Admitting: Cardiology

## 2023-09-27 ENCOUNTER — Other Ambulatory Visit: Payer: Self-pay | Admitting: Cardiology

## 2023-12-06 ENCOUNTER — Telehealth (HOSPITAL_BASED_OUTPATIENT_CLINIC_OR_DEPARTMENT_OTHER): Payer: Self-pay | Admitting: *Deleted

## 2023-12-06 ENCOUNTER — Telehealth (HOSPITAL_BASED_OUTPATIENT_CLINIC_OR_DEPARTMENT_OTHER): Payer: Self-pay

## 2023-12-06 NOTE — Telephone Encounter (Signed)
 S/w the pt's wife (DPR) who scheduled tele preop appt for pt 12/18/23. Med rec and consent are done     Patient Consent for Virtual Visit        [name] has provided verbal consent on 12/06/2023 for a virtual visit (video or telephone).   CONSENT FOR VIRTUAL VISIT FOR:  [name]  By participating in this virtual visit I agree to the following:  I hereby voluntarily request, consent and authorize [facility] and its employed or contracted physicians, physician assistants, nurse practitioners or other licensed health care professionals (the Practitioner), to provide me with telemedicine health care services (the "Services) as deemed necessary by the treating Practitioner. I acknowledge and consent to receive the Services by the Practitioner via telemedicine. I understand that the telemedicine visit will involve communicating with the Practitioner through live audiovisual communication technology and the disclosure of certain medical information by electronic transmission. I acknowledge that I have been given the opportunity to request an in-person assessment or other available alternative prior to the telemedicine visit and am voluntarily participating in the telemedicine visit.  I understand that I have the right to withhold or withdraw my consent to the use of telemedicine in the course of my care at any time, without affecting my right to future care or treatment, and that the Practitioner or I may terminate the telemedicine visit at any time. I understand that I have the right to inspect all information obtained and/or recorded in the course of the telemedicine visit and may receive copies of available information for a reasonable fee.  I understand that some of the potential risks of receiving the Services via telemedicine include:  Delay or interruption in medical evaluation due to technological equipment failure or disruption; Information transmitted may not be  sufficient (e.g. poor resolution of images) to allow for appropriate medical decision making by the Practitioner; and/or  In rare instances, security protocols could fail, causing a breach of personal health information.  Furthermore, I acknowledge that it is my responsibility to provide information about my medical history, conditions and care that is complete and accurate to the best of my ability. I acknowledge that Practitioner's advice, recommendations, and/or decision may be based on factors not within their control, such as incomplete or inaccurate data provided by me or distortions of diagnostic images or specimens that may result from electronic transmissions. I understand that the practice of medicine is not an exact science and that Practitioner makes no warranties or guarantees regarding treatment outcomes. I acknowledge that a copy of this consent can be made available to me via my patient portal [facility] MyChart), or I can request a printed copy by calling the office of [facility].    I understand that my insurance will be billed for this visit.   I have read or had this consent read to me. I understand the contents of this consent, which adequately explains the benefits and risks of the Services being provided via telemedicine.  I have been provided ample opportunity to ask questions regarding this consent and the Services and have had my questions answered to my satisfaction. I give my informed consent for the services to be provided through the use of telemedicine in my medical care

## 2023-12-06 NOTE — Telephone Encounter (Signed)
   Name: [name]  DOB: 01-20-1941  MRN: [id number]  Primary Cardiologist: Alm Clay, MD   Preoperative team, please contact this patient and set up a phone call appointment for further preoperative risk assessment. Please obtain consent and complete medication review. Thank you for your help.  I confirm that guidance regarding antiplatelet and oral anticoagulation therapy has been completed and, if necessary, noted below.  Clopidogrel  (Plavix ) can be held for 7 days prior to his surgery and resumed as soon as possible post op.   I also confirmed the patient resides in the state of [place] . As per [facility] Medical Board telemedicine laws, the patient must reside in the state in which the provider is licensed.   Lamarr Satterfield, NP 12/06/2023, 9:34 AM  [facility]

## 2023-12-06 NOTE — Telephone Encounter (Signed)
 S/w the pt's wife (DPR) who scheduled tele preop appt for pt 12/18/23. Med rec and consent are done.

## 2023-12-06 NOTE — Telephone Encounter (Signed)
   Pre-operative Risk Assessment    Patient Name: [name]  DOB: 16-Dec-1940 MRN: [id number]   Date of last office visit: 04/15/2023 with Alm Clay, MD Date of next office visit: N/A    Request for Surgical Clearance    Procedure:  Lumbar fusion   Date of Surgery:  Clearance TBD                                 Surgeon:  Dr. Reyes Budge Surgeon's Group or Practice Name:  [facility] Phone number:  [phone] Fax number:  [phone] or [phone] - Attention Nikki   Type of Clearance Requested:   - Medical  - Pharmacy:  Hold Clopidogrel  (Plavix ) - Not specified   Type of Anesthesia:  General    Additional requests/questions:  N/A  [name]   12/06/2023, 9:17 AM

## 2023-12-09 ENCOUNTER — Other Ambulatory Visit: Payer: Self-pay | Admitting: Neurosurgery

## 2023-12-18 ENCOUNTER — Ambulatory Visit: Attending: Cardiology | Admitting: Physician Assistant

## 2023-12-18 DIAGNOSIS — Z01810 Encounter for preprocedural cardiovascular examination: Secondary | ICD-10-CM

## 2023-12-18 NOTE — Progress Notes (Signed)
 Virtual Visit via Telephone Note   Because of [name] co-morbid illnesses, he is at least at moderate risk for complications without adequate follow up.  This format is felt to be most appropriate for this patient at this time.  Due to technical limitations with video connection (technology), today's appointment will be conducted as an audio only telehealth visit, and [name] verbally agreed to proceed in this manner.   All issues noted in this document were discussed and addressed.  No physical exam could be performed with this format.  Evaluation Performed:  Preoperative cardiovascular risk assessment _____________   Date:  12/18/2023   Patient ID:  [name], DOB 10/15/1940, MRN [id number] Patient Location:  Home Provider location:   Office  Primary Care Provider:  Orpha Yancey LABOR, MD Primary Cardiologist:  Alm Clay, MD  Chief Complaint / Patient Profile   83 y.o. y/o male with a h/o coronary artery disease carotid artery disease, moderate ischemic cardiomyopathy diabetes mellitus, hypertension who is pending lumbar fusion and presents today for telephonic preoperative cardiovascular risk assessment.  History of Present Illness    [name] is a 83 y.o. male who presents via audio/video conferencing for a telehealth visit today.  Pt was last seen in cardiology clinic on 04/17/2023 by Dr. Clay.  At that time [name] was doing well and exercising regularly going to the gym about 3 times a week denying any calves or thighs during exercise. The patient is now pending procedure as outlined above. Since his last visit, he has not had any chest pain or SOB. No swelling in feet or ankles. At the gym he does some walking and does back exercises as well as arm exercises. He goes usually 3 times a week.   Clopidogrel  (Plavix ) can be held for 7 days prior to his surgery and resumed as soon as possible post op. Went through other medications and  advised holding NSAIDs, vitamins, and fish oil for a week prior as well.   Past Medical History    Past Medical History:  Diagnosis Date   CAD, multiple vessel 05/2010; 03/2012   Dr. Clay (Cardiologist): For Abnormal ST: CABG X 5; cath 03/2012 --> LIMA-LAD - patent, SVG-Ramus - anastamotic 80% - DES stent; SVG-RCA & SVG-OM1-OM2-- occluded; DES Stent to Cx-OM1   Cardiomyopathy, ischemic- moderate LVD (EF of roughly 45%) at cath 2/143 11/03/2012   Resolved - f/u Echo with EF 55-60%   Carotid artery narrowings 03/2018   Moderate L ICA 40-59%   Carpal tunnel syndrome on right    Chronic back pain    spinal stenosis and scoliosis   DDD (degenerative disc disease), lumbosacral    Diabetes type 2, controlled (HCC)    takes Metformin  once a wk   DJD (degenerative joint disease) 9/12   Rt TKR   Dyslipidemia    takes Simvastatin  daily   First degree heart block    GERD (gastroesophageal reflux disease)    History of colon polyps    benign   History of shingles    forehead --  dec 2016--  no residual pain   Osteoarthritis    Peripheral arterial disease May 2016  (seen by dr berry)   L. ABI 0.58 with high grade L. SFA stenosis and occluded L. DP. R. ABI 0.48 with occluded R. SFA and occluded R. DP.   Pneumonia    hx of-as a child   Resistant hypertension    takes Coreg ,HCTZ,and  Micardis  daily   Weakness    numbness and tingling   Past Surgical History:  Procedure Laterality Date   Carotid Dopplers  03/2018    Normal LV size and function.  EF 55 to 60%.  No R WMA.  Impaired relaxation (GR 1 DD).  Moderate LA dilation.  Moderate aortic thickening and calcification-sclerosis with no stenosis.   CARPAL TUNNEL RELEASE Right 10/10/2015   Procedure: RIGHT HAND CARPAL TUNNEL RELEASE;  Surgeon: Prentice Pagan, MD;  Location: [facility];  Service: Orthopedics;  Laterality: Right;   COLONOSCOPY     CORONARY ANGIOPLASTY     3 stents   CORONARY ARTERY BYPASS GRAFT  05/22/2010  dr  fleeta tright   x 5 (LIMA-LAD, SVG- RI,. SVG-OM1-OM2, SVG-dRCA)   DECOMPRESSIVE LUMBAR LAMINECTOMY LEVEL 1 N/A 03/08/2016   Procedure: Lumbar 3-5 decompression with in situ fusion;  Surgeon: Donaciano Sprang, MD;  Location: [facility] OR;  Service: Orthopedics;  Laterality: N/A;   LEFT HEART CATH AND CORONARY ANGIOGRAPHY  05/15/2010   abnormal stress test-- 3 vessel CAD/  LVEDP=31mmHg  (Dr. Mona for Dr,. Anner)    LEFT HEART CATHETERIZATION WITH CORONARY ANGIOGRAM N/A 04/02/2012   Procedure: LEFT HEART CATHETERIZATION WITH CORONARY ANGIOGRAM;  Surgeon: Alm LELON Anner, MD;  Location: [facility] CATH LAB: Occluded SVG-rPDA & native RCA, 100% SVG-OM. Severe anastomotic lesion in SVG-RI.  Patent LIMA-LAD. Severe mid Cx-OM   LUMBAR LAMINECTOMY/DECOMPRESSION MICRODISCECTOMY Bilateral 11/16/2021   Procedure: L23 LAM/FORAM;  Surgeon: Mavis Purchase, MD;  Location: [facility] OR;  Service: Neurosurgery;  Laterality: Bilateral;  3C   NM MYOVIEW  LTD  03/25/2012   A) High Risk TM Myoview  w/ apical & upper mid to basal Ing wall Scar w/ significant iscemia Inf -> Inf-Lat from apex to base/  severe LV dysfxn - infero-apical dyskinesis and HK in Inf & Lat wall, EF 33%; b) large-size, mod-intensity ~ reversible perfusion defect in Inf Wall suggesting ischemia. EF 34%. Global HK & Severe Inf HK to AK. Also - horizontal ST depression Inf-Lat. HIGH RISK   NM MYOVIEW  LTD  04/19/2021   LOW RISK.  EF 53%.  Fixed inferior perfusion defect (with normal wall motion-suspect artifact/diaphragmatic attenuation).   PERCUTANEOUS CORONARY STENT INTERVENTION (PCI-S) N/A 04/08/2012   Procedure: PERCUTANEOUS CORONARY STENT INTERVENTION (PCI-S);  Surgeon: Alm LELON Anner, MD;  Location: [facility] CATH LAB: Promus Premier DES 2.25 mm x 12 mm - Mid Cx-OM1; Promus Premier DES 2.5 mm x 16 mm -- anastomotic SVG-RI (graft into native)   TOTAL KNEE ARTHROPLASTY Right 10/26/2010   TRANSTHORACIC ECHOCARDIOGRAM  08/21/2010   mild LVH, EF 45-50%, inferior hypokinesis,  grade  1 diastolic dysfunction/  moderate LAE/   mild MR/  trivial TR/  mild dilated RV   TRANSTHORACIC ECHOCARDIOGRAM  03/2018    Normal LV size and function.  EF 55 to 60%.  No R WMA.  Impaired relaxation (GR 1 DD).  Moderate LA dilation.  Moderate aortic thickening and calcification-sclerosis with no stenosis.   UMBILICAL HERNIA REPAIR  1980's    Allergies  Allergies  Allergen Reactions   No Known Allergies     Home Medications    Prior to Admission medications   Medication Sig Start Date End Date Taking? Authorizing Provider  atorvastatin  (LIPITOR) 80 MG tablet TAKE 1 TABLET BY MOUTH EVERY DAY 09/27/23   Anner Alm LELON, MD  carvedilol  (COREG ) 25 MG tablet TAKE 1 TABLET (25 MG TOTAL) BY MOUTH 2 (TWO) TIMES DAILY. Patient taking differently: Take 25 mg by  mouth 2 (two) times daily with a meal. 12/17/17   Anner Alm ORN, MD  chlorthalidone  (HYGROTON ) 50 MG tablet TAKE 1 TABLET BY MOUTH EVERY DAY 03/21/23   Anner Alm ORN, MD  Cholecalciferol (VITAMIN D3) 50 MCG (2000 UT) CAPS Take 2,000 Units by mouth daily.    [provider]  clopidogrel  (PLAVIX ) 75 MG tablet TAKE 1 TABLET BY MOUTH EVERY DAY 09/24/23   Anner Alm ORN, MD  cyclobenzaprine  (FLEXERIL ) 5 MG tablet Take 1 tablet (5 mg total) by mouth 3 (three) times daily as needed for muscle spasms. 11/17/21   Mavis Purchase, MD  docusate sodium  (COLACE) 100 MG capsule Take 1 capsule (100 mg total) by mouth 2 (two) times daily. 11/17/21   Mavis Purchase, MD  ezetimibe  (ZETIA ) 10 MG tablet TAKE 1 TABLET BY MOUTH EVERY DAY 05/30/23   Anner Alm ORN, MD  fish oil-omega-3 fatty acids 1000 MG capsule Take 1 g by mouth daily.    [provider]  hydrocortisone cream 0.5 % Apply 1 Application topically daily as needed for itching.    [provider]  Menthol , Topical Analgesic, (ABSORBINE PLUS JR EX) Apply 1 application  topically daily as needed (Pain).    [provider]  metFORMIN  (GLUCOPHAGE ) 500 MG tablet  Take 500 mg by mouth once a week. Patient not taking: Reported on 12/06/2023    [provider]  naproxen  sodium (ALEVE ) 220 MG tablet Take 220 mg by mouth daily as needed (pain).    [provider]  nitroGLYCERIN  (NITROSTAT ) 0.4 MG SL tablet Place 1 tablet (0.4 mg total) under the tongue every 5 (five) minutes as needed for chest pain. 04/09/12   Kilroy, Luke K, PA-C  oxyCODONE -acetaminophen  (PERCOCET/ROXICET) 5-325 MG tablet Take 1-2 tablets by mouth every 4 (four) hours as needed for moderate pain. Patient not taking: Reported on 12/06/2023 11/17/21   Mavis Purchase, MD  telmisartan  (MICARDIS ) 80 MG tablet TAKE 1 TABLET BY MOUTH EVERY DAY 07/01/23   Anner Alm ORN, MD  triamcinolone cream (KENALOG) 0.1 % Apply 1 Application topically daily as needed (Rash). 03/05/19   [provider]    Physical Exam    Vital Signs:  [name] does not have vital signs available for review today.  Given telephonic nature of communication, physical exam is limited. AAOx3. NAD. Normal affect.  Speech and respirations are unlabored.  Accessory Clinical Findings    None  Assessment & Plan    1.  Preoperative Cardiovascular Risk Assessment:  Mr. Bloor perioperative risk of a major cardiac event is 11% according to the Revised Cardiac Risk Index (RCRI).  Therefore, he is at high risk for perioperative complications.   His functional capacity is good at 5.19 METs according to the Duke Activity Status Index (DASI). Recommendations: According to ACC/AHA guidelines, no further cardiovascular testing needed.  The patient may proceed to surgery at acceptable risk.   Antiplatelet and/or Anticoagulation Recommendations: Clopidogrel  (Plavix ) can be held for 7 days prior to his surgery and resumed as soon as possible post op.  The patient was advised that if he develops new symptoms prior to surgery to contact our office to arrange for a follow-up visit, and he verbalized  understanding.   A copy of this note will be routed to requesting surgeon.  Time:   Today, I have spent 16 minutes with the patient with telehealth technology discussing medical history, symptoms, and management plan.     Orren LOISE Fabry, PA-C  12/18/2023, 7:29  AM

## 2023-12-24 ENCOUNTER — Encounter (HOSPITAL_COMMUNITY): Payer: Self-pay

## 2023-12-24 NOTE — Progress Notes (Signed)
 Surgical Instructions   Your procedure is scheduled on Monday, 12/30/23. Report to [facility] Main Entrance A at  5:30 A.M., then check in with the Admitting office. Any questions or running late day of surgery: call [phone]  Questions prior to your surgery date: call [phone], Monday-Friday, 8am-4pm. If you experience any cold or flu symptoms such as cough, fever, chills, shortness of breath, etc. between now and your scheduled surgery, please notify us  at the above number.     Remember:  Do not eat or drink after midnight the night before your surgery-Sunday    Take these medicines the morning of surgery with A SIP OF WATER: alfuzosin   atorvastatin  (LIPITOR)  carvedilol  (COREG )  ezetimibe  (ZETIA )   May take these medicines IF NEEDED:  None   Clopidogrel  (Plavix ) can be held for 7 days prior to procedure.  Last dose was on 12/22/23.    Do not take Metformin  on the morning of surgery  We will check your blood sugar upon aprrival in the Short Stay Department and will treat if needed.    One week prior to surgery, STOP taking any Aspirin  (unless otherwise instructed by your surgeon) Aleve , Naproxen , Ibuprofen, Motrin, Advil, Goody's, BC's, all herbal medications, fish oil, and non-prescription vitamins.                     Do NOT Smoke (Tobacco/Vaping) for 24 hours prior to your procedure.  If you use a CPAP at night, you may bring your mask/headgear for your overnight stay.   You will be asked to remove any contacts, glasses, piercing's, hearing aid's, dentures/partials prior to surgery. Please bring cases for these items if needed.    Patients discharged the day of surgery will not be allowed to drive home, and someone needs to stay with them for 24 hours.  SURGICAL WAITING ROOM VISITATION Patients may have no more than 2 support people in the waiting area - these visitors may rotate.   Pre-op nurse will coordinate an appropriate time for 1 ADULT support person,  who may not rotate, to accompany patient in pre-op.  Children under the age of 69 must have an adult with them who is not the patient and must remain in the main waiting area with an adult.  If the patient needs to stay at the hospital during part of their recovery, the visitor guidelines for inpatient rooms apply.  Please refer to the [facility] website for the visitor guidelines for any additional information.   If you received a COVID test during your pre-op visit  it is requested that you wear a mask when out in public, stay away from anyone that may not be feeling well and notify your surgeon if you develop symptoms. If you have been in contact with anyone that has tested positive in the last 10 days please notify you surgeon.      Pre-operative 4 CHG Bathing Instructions   You can play a key role in reducing the risk of infection after surgery. Your skin needs to be as free of germs as possible. You can reduce the number of germs on your skin by washing with CHG (chlorhexidine  gluconate) soap before surgery. CHG is an antiseptic soap that kills germs and continues to kill germs even after washing.   DO NOT use if you have an allergy to chlorhexidine /CHG or antibacterial soaps. If your skin becomes reddened or irritated, stop using the CHG and notify one of our RNs at  [phone].   Please shower with the CHG soap starting 4 days before surgery using the following schedule:     Please keep in mind the following:  DO NOT shave, including legs and underarms, starting the day of your first shower.   You may shave your face at any point before/day of surgery.  Place clean sheets on your bed the day you start using CHG soap. Use a clean washcloth (not used since being washed) for each shower. DO NOT sleep with pets once you start using the CHG.   CHG Shower Instructions:  Wash your face and private area with normal soap. If you choose to wash your hair, wash first with your normal  shampoo.  After you use shampoo/soap, rinse your hair and body thoroughly to remove shampoo/soap residue.  Turn the water OFF and apply  bottle of CHG soap to a CLEAN washcloth.  Apply CHG soap ONLY FROM YOUR NECK DOWN TO YOUR TOES (washing for 3-5 minutes)  DO NOT use CHG soap on face, private areas, open wounds, or sores.  Pay special attention to the area where your surgery is being performed.  If you are having back surgery, having someone wash your back for you may be helpful. Wait 2 minutes after CHG soap is applied, then you may rinse off the CHG soap.  Pat dry with a clean towel  Put on clean clothes/pajamas   If you choose to wear lotion, please use ONLY the CHG-compatible lotions that are listed below.  Additional instructions for the day of surgery:  If you choose, you may shower the morning of surgery with an antibacterial soap.  DO NOT APPLY any lotions, deodorants, cologne, or perfumes.   Do not bring valuables to the hospital. [facility] is not responsible for any belongings/valuables. Do not wear nail polish, gel polish, artificial nails, or any other type of covering on natural nails (fingers and toes) Do not wear jewelry or makeup Put on clean/comfortable clothes.  Please brush your teeth.  Ask your nurse before applying any prescription medications to the skin.     CHG Compatible Lotions   Aveeno Moisturizing lotion  Cetaphil Moisturizing Cream  Cetaphil Moisturizing Lotion  Clairol Herbal Essence Moisturizing Lotion, Dry Skin  Clairol Herbal Essence Moisturizing Lotion, Extra Dry Skin  Clairol Herbal Essence Moisturizing Lotion, Normal Skin  Curel Age Defying Therapeutic Moisturizing Lotion with Alpha Hydroxy  Curel Extreme Care Body Lotion  Curel Soothing Hands Moisturizing Hand Lotion  Curel Therapeutic Moisturizing Cream, Fragrance-Free  Curel Therapeutic Moisturizing Lotion, Fragrance-Free  Curel Therapeutic Moisturizing Lotion, Original Formula   Eucerin Daily Replenishing Lotion  Eucerin Dry Skin Therapy Plus Alpha Hydroxy Crme  Eucerin Dry Skin Therapy Plus Alpha Hydroxy Lotion  Eucerin Original Crme  Eucerin Original Lotion  Eucerin Plus Crme Eucerin Plus Lotion  Eucerin TriLipid Replenishing Lotion  Keri Anti-Bacterial Hand Lotion  Keri Deep Conditioning Original Lotion Dry Skin Formula Softly Scented  Keri Deep Conditioning Original Lotion, Fragrance Free Sensitive Skin Formula  Keri Lotion Fast Absorbing Fragrance Free Sensitive Skin Formula  Keri Lotion Fast Absorbing Softly Scented Dry Skin Formula  Keri Original Lotion  Keri Skin Renewal Lotion Keri Silky Smooth Lotion  Keri Silky Smooth Sensitive Skin Lotion  Nivea Body Creamy Conditioning Oil  Nivea Body Extra Enriched Teacher, Adult Education Moisturizing Lotion Nivea Crme  Nivea Skin Firming Lotion  NutraDerm 30 Skin Lotion  NutraDerm Skin Lotion  NutraDerm Therapeutic Skin Cream  NutraDerm Therapeutic Skin Lotion  ProShield Protective Hand Cream  Provon moisturizing lotion  Please read over the following fact sheets that you were given.

## 2023-12-25 ENCOUNTER — Encounter (HOSPITAL_COMMUNITY): Payer: Self-pay

## 2023-12-25 ENCOUNTER — Encounter (HOSPITAL_COMMUNITY)
Admission: RE | Admit: 2023-12-25 | Discharge: 2023-12-25 | Disposition: A | Discharge status: Home / Self Care | Source: Ambulatory Visit | Attending: Neurosurgery | Admitting: Neurosurgery

## 2023-12-25 ENCOUNTER — Other Ambulatory Visit: Payer: Self-pay

## 2023-12-25 VITALS — BP 141/60 | HR 73 | Temp 98.4°F | Resp 18 | Ht 74.0 in | Wt 215.0 lb

## 2023-12-25 DIAGNOSIS — D649 Anemia, unspecified: Secondary | ICD-10-CM | POA: Insufficient documentation

## 2023-12-25 DIAGNOSIS — Z951 Presence of aortocoronary bypass graft: Secondary | ICD-10-CM | POA: Diagnosis not present

## 2023-12-25 DIAGNOSIS — Z7902 Long term (current) use of antithrombotics/antiplatelets: Secondary | ICD-10-CM | POA: Insufficient documentation

## 2023-12-25 DIAGNOSIS — Z955 Presence of coronary angioplasty implant and graft: Secondary | ICD-10-CM | POA: Insufficient documentation

## 2023-12-25 DIAGNOSIS — Z01812 Encounter for preprocedural laboratory examination: Secondary | ICD-10-CM | POA: Insufficient documentation

## 2023-12-25 DIAGNOSIS — E871 Hypo-osmolality and hyponatremia: Secondary | ICD-10-CM | POA: Diagnosis not present

## 2023-12-25 DIAGNOSIS — I2510 Atherosclerotic heart disease of native coronary artery without angina pectoris: Secondary | ICD-10-CM | POA: Diagnosis not present

## 2023-12-25 DIAGNOSIS — Z87891 Personal history of nicotine dependence: Secondary | ICD-10-CM | POA: Diagnosis not present

## 2023-12-25 DIAGNOSIS — K219 Gastro-esophageal reflux disease without esophagitis: Secondary | ICD-10-CM | POA: Diagnosis not present

## 2023-12-25 DIAGNOSIS — E119 Type 2 diabetes mellitus without complications: Secondary | ICD-10-CM | POA: Diagnosis not present

## 2023-12-25 DIAGNOSIS — E785 Hyperlipidemia, unspecified: Secondary | ICD-10-CM | POA: Diagnosis not present

## 2023-12-25 DIAGNOSIS — Z79899 Other long term (current) drug therapy: Secondary | ICD-10-CM | POA: Insufficient documentation

## 2023-12-25 DIAGNOSIS — I10 Essential (primary) hypertension: Secondary | ICD-10-CM | POA: Insufficient documentation

## 2023-12-25 DIAGNOSIS — Z01818 Encounter for other preprocedural examination: Secondary | ICD-10-CM | POA: Diagnosis present

## 2023-12-25 LAB — BASIC METABOLIC PANEL WITH GFR
Anion gap: 12 (ref 5–15)
BUN: 21 mg/dL (ref 8–23)
CO2: 19 mmol/L — ABNORMAL LOW (ref 22–32)
Calcium: 9.8 mg/dL (ref 8.9–10.3)
Chloride: 103 mmol/L (ref 98–111)
Creatinine, Ser: 1.46 mg/dL — ABNORMAL HIGH (ref 0.61–1.24)
GFR, Estimated: 48 mL/min — ABNORMAL LOW (ref >60)
Glucose, Bld: 97 mg/dL (ref 70–99)
Potassium: 4.2 mmol/L (ref 3.5–5.1)
Sodium: 134 mmol/L — ABNORMAL LOW (ref 135–145)

## 2023-12-25 LAB — TYPE AND SCREEN
ABO/RH(D): A POS
Antibody Screen: NEGATIVE

## 2023-12-25 LAB — HEMOGLOBIN A1C
Hgb A1c MFr Bld: 7.5 % — ABNORMAL HIGH (ref 4.8–5.6)
Mean Plasma Glucose: 168.55 mg/dL

## 2023-12-25 LAB — CBC
HCT: 32.9 % — ABNORMAL LOW (ref 39.0–52.0)
Hemoglobin: 10.6 g/dL — ABNORMAL LOW (ref 13.0–17.0)
MCH: 26.9 pg (ref 26.0–34.0)
MCHC: 32.2 g/dL (ref 30.0–36.0)
MCV: 83.5 fL (ref 80.0–100.0)
Platelets: 182 K/uL (ref 150–400)
RBC: 3.94 MIL/uL — ABNORMAL LOW (ref 4.22–5.81)
RDW: 13.6 % (ref 11.5–15.5)
WBC: 6.7 K/uL (ref 4.0–10.5)
nRBC: 0 % (ref 0.0–0.2)

## 2023-12-25 LAB — GLUCOSE, CAPILLARY: Glucose-Capillary: 107 mg/dL — ABNORMAL HIGH (ref 70–99)

## 2023-12-25 LAB — SURGICAL PCR SCREEN
MRSA, PCR: NEGATIVE
Staphylococcus aureus: NEGATIVE

## 2023-12-25 NOTE — Progress Notes (Signed)
 PCP - Dr. Yancey Reno Cardiologist - Dr. Alm Clay  PPM/ICD - denies   Chest x-ray - 03/02/18 EKG - 04/15/23 Stress Test - 04/19/21 ECHO - 03/20/18 Cardiac Cath - 04/02/12  Sleep Study - denies   Fasting Blood Sugar - 80-90 Checks Blood Sugar 1-2 times/week  Last dose of GLP1 agonist-  n/a   Blood Thinner Instructions: Hold Plavix  7 days. Last dose 11/9 Aspirin  Instructions: n/a  ERAS Protcol - no, NPO   COVID TEST- n/a   Anesthesia review: yes, cardiac hx  Patient denies shortness of breath, fever, cough and chest pain at PAT appointment   All instructions explained to the patient, with a verbal understanding of the material. Patient agrees to go over the instructions while at home for a better understanding.  The opportunity to ask questions was provided.

## 2023-12-26 NOTE — Progress Notes (Signed)
 Anesthesia Chart Review:  83 year old male follows cardiology for history of resistant HTN (on carvedilol  25 mg twice daily, amlodipine  10 mg daily, chlorthalidone  50 mg daily, telmisartan  80 mg daily), HLD, CAD s/p CABG x 5 in April 2012 and subsequent PCI to SVG-RI and native Cx-OM in 2014.  Nuclear stress 04/2021 was low risk, EF 53%.  Seen by Orren Fabry, PA-C on 12/18/2023 for preop evaluation.  Per note, Mr. Farrelly perioperative risk of a major cardiac event is 11% according to the Revised Cardiac Risk Index (RCRI).  Therefore, he is at high risk for perioperative complications.   His functional capacity is good at 5.19 METs according to the Duke Activity Status Index (DASI). Recommendations: According to ACC/AHA guidelines, no further cardiovascular testing needed.  The patient may proceed to surgery at acceptable risk. Antiplatelet and/or Anticoagulation Recommendations: Clopidogrel  (Plavix ) can be held for 7 days prior to his surgery and resumed as soon as possible post op. The patient was advised that if he develops new symptoms prior to surgery to contact our office to arrange for a follow-up visit, and he verbalized understanding.  Other pertinent history includes former smoker (quit 1982), non-insulin -dependent DM2, GERD.  Patient reports last dose Plavix  12/22/2023.  Preop labs reviewed, mild hyponatremia sodium 134, creatinine mildly elevated 1.46, mild anemia with hemoglobin 10.6, otherwise unremarkable.  A1c 7.5.  EKG 04/15/2023: Sinus rhythm with occasional Premature ventricular complexes.  Rate 70. Minimal voltage criteria for LVH, may be normal variant ( R in aVL ). Inferior infarct (cited on or before 03-Apr-2010)  Carotid duplex 05/07/2023: Summary:  Right Carotid: Velocities in the right ICA are consistent with a 1-39% stenosis.   Left Carotid: Velocities in the left ICA are consistent with a 40-59% stenosis.   Vertebrals: Bilateral vertebral arteries demonstrate antegrade  flow.  Subclavians: Normal flow hemodynamics were seen in bilateral subclavian arteries.   Nuclear stress 04/19/2021:    Findings are consistent with ischemia. The study is low risk.   No ST deviation was noted.   Left ventricular function is normal. Nuclear stress EF: 53 %. The left ventricular ejection fraction is mildly decreased (45-54%). End diastolic cavity size is mildly enlarged. End systolic cavity size is normal.   Prior study available for comparison from 05/09/2016. Improved from prior   Findings: Negative for stress induced arrhythmias. No evidence of ischemia. There is an inferior perfusion defect in rest and stress with normal wall motion consistent with artifact. There is subdiaphragmatic attenuation.   Conclusions: Stress test is negative for ischemia and less consistent with significant infarction. Moderate risk study due to mildly decreased LVEF (53%). Compared to prior: no ST depressions, No RV uptake, improved perfusion, improved LV function.  TTE 03/20/2018: 1. The left ventricle has normal systolic function of 55-60%. The cavity  size was normal. There is moderately increased left ventricular wall  thickness of the basal-septum wall. Echo evidence of impaired diastolic  relaxation and indeterminate left  ventricular filling pressure.   2. The right ventricle has normal systolic function. The cavity was  normal. There is no increase in right ventricular wall thickness.   3. Left atrial size was moderately dilated.   4. The mitral valve is normal in structure.   5. The tricuspid valve is normal in structure.   6. The aortic valve is normal in structure. There is moderate thickening  and moderate calcification of the aortic valve.   7. The pulmonic valve was normal in structure.     Lynwood Hope,  PA-C [facility] Short Stay Center/Anesthesiology Phone [phone] 12/26/2023 9:29 AM

## 2023-12-26 NOTE — Anesthesia Preprocedure Evaluation (Addendum)
 Anesthesia Evaluation  Patient identified by MRN, date of birth, ID band Patient awake    Reviewed: Allergy & Precautions, NPO status , Patient's Chart, lab work & pertinent test results  Airway Mallampati: II  TM Distance: >3 FB Neck ROM: Full    Dental  (+) Partial Upper   Pulmonary former smoker   Pulmonary exam normal        Cardiovascular hypertension, Pt. on medications and Pt. on home beta blockers + CAD, + Cardiac Stents, + CABG and + Peripheral Vascular Disease  Normal cardiovascular exam     Neuro/Psych    GI/Hepatic negative GI ROS, Neg liver ROS,,,  Endo/Other  diabetes, Oral Hypoglycemic Agents    Renal/GU Renal InsufficiencyRenal disease     Musculoskeletal   Abdominal   Peds  Hematology  (+) Blood dyscrasia (Plavix ), anemia   Anesthesia Other Findings SPONDYLOLISTHESIS, LUMBAR REGION  Reproductive/Obstetrics                              Anesthesia Physical Anesthesia Plan  ASA: 3  Anesthesia Plan: General   Post-op Pain Management:    Induction: Intravenous  PONV Risk Score and Plan: 2 and Ondansetron , Dexamethasone  and Treatment may vary due to age or medical condition  Airway Management Planned: Oral ETT  Additional Equipment: Arterial line  Intra-op Plan:   Post-operative Plan: Extubation in OR  Informed Consent: I have reviewed the patients History and Physical, chart, labs and discussed the procedure including the risks, benefits and alternatives for the proposed anesthesia with the patient or authorized representative who has indicated his/her understanding and acceptance.     Dental advisory given  Plan Discussed with: CRNA  Anesthesia Plan Comments: (PAT note by Lynwood Hope, PA-C:  83 year old male follows cardiology for history of resistant HTN (on carvedilol  25 mg twice daily, amlodipine  10 mg daily, chlorthalidone  50 mg daily, telmisartan  80 mg  daily), HLD, CAD s/p CABG x 5 in April 2012 and subsequent PCI to SVG-RI and native Cx-OM in 2014.  Nuclear stress 04/2021 was low risk, EF 53%.  Seen by Orren Fabry, PA-C on 12/18/2023 for preop evaluation.  Per note, Mr. Bomberger perioperative risk of a major cardiac event is 11% according to the Revised Cardiac Risk Index (RCRI).  Therefore, he is at high risk for perioperative complications.   His functional capacity is good at 5.19 METs according to the Duke Activity Status Index (DASI). Recommendations: According to ACC/AHA guidelines, no further cardiovascular testing needed.  The patient may proceed to surgery at acceptable risk. Antiplatelet and/or Anticoagulation Recommendations: Clopidogrel  (Plavix ) can be held for 7 days prior to his surgery and resumed as soon as possible post op. The patient was advised that if he develops new symptoms prior to surgery to contact our office to arrange for a follow-up visit, and he verbalized understanding.  Other pertinent history includes former smoker (quit 1982), non-insulin -dependent DM2, GERD.  Patient reports last dose Plavix  12/22/2023.  Preop labs reviewed, mild hyponatremia sodium 134, creatinine mildly elevated 1.46, mild anemia with hemoglobin 10.6, otherwise unremarkable.  A1c 7.5.  EKG 04/15/2023: Sinus rhythm with occasional Premature ventricular complexes.  Rate 70. Minimal voltage criteria for LVH, may be normal variant ( R in aVL ). Inferior infarct (cited on or before 03-Apr-2010)  Carotid duplex 05/07/2023: Summary:  Right Carotid: Velocities in the right ICA are consistent with a 1-39% stenosis.   Left Carotid: Velocities in the left ICA are  consistent with a 40-59% stenosis.   Vertebrals:Bilateral vertebral arteries demonstrate antegrade flow.  Subclavians: Normal flow hemodynamics were seen in bilateral subclavian arteries.   Nuclear stress 04/19/2021:    Findings are consistent with ischemia. The study is low risk.   No ST  deviation was noted.   Left ventricular function is normal. Nuclear stress EF: 53 %. The left ventricular ejection fraction is mildly decreased (45-54%). End diastolic cavity size is mildly enlarged. End systolic cavity size is normal.   Prior study available for comparison from 05/09/2016. Improved from prior  Findings: Negative for stress induced arrhythmias. No evidence of ischemia. There is an inferior perfusion defect in rest and stress with normal wall motion consistent with artifact. There is subdiaphragmatic attenuation.  Conclusions: Stress test is negative for ischemia and less consistent with significant infarction. Moderate risk study due to mildly decreased LVEF (53%). Compared to prior: no ST depressions, No RV uptake, improved perfusion, improved LV function.  TTE 03/20/2018: 1. The left ventricle has normal systolic function of 55-60%. The cavity  size was normal. There is moderately increased left ventricular wall  thickness of the basal-septum wall. Echo evidence of impaired diastolic  relaxation and indeterminate left  ventricular filling pressure.  2. The right ventricle has normal systolic function. The cavity was  normal. There is no increase in right ventricular wall thickness.  3. Left atrial size was moderately dilated.  4. The mitral valve is normal in structure.  5. The tricuspid valve is normal in structure.  6. The aortic valve is normal in structure. There is moderate thickening  and moderate calcification of the aortic valve.  7. The pulmonic valve was normal in structure.   )         Anesthesia Quick Evaluation

## 2023-12-30 ENCOUNTER — Ambulatory Visit (HOSPITAL_COMMUNITY)

## 2023-12-30 ENCOUNTER — Other Ambulatory Visit: Payer: Self-pay

## 2023-12-30 ENCOUNTER — Ambulatory Visit (HOSPITAL_COMMUNITY): Payer: Self-pay | Admitting: Physician Assistant

## 2023-12-30 ENCOUNTER — Ambulatory Visit (HOSPITAL_COMMUNITY): Payer: Self-pay | Admitting: Anesthesiology

## 2023-12-30 ENCOUNTER — Observation Stay (HOSPITAL_COMMUNITY)
Admission: RE | Admit: 2023-12-30 | Discharge: 2024-01-01 | Disposition: A | Discharge status: Home Health | Attending: Neurosurgery | Admitting: Neurosurgery

## 2023-12-30 ENCOUNTER — Encounter (HOSPITAL_COMMUNITY): Payer: Self-pay | Admitting: Neurosurgery

## 2023-12-30 ENCOUNTER — Ambulatory Visit (HOSPITAL_COMMUNITY): Admission: RE | Disposition: A | Payer: Self-pay | Source: Home / Self Care | Attending: Neurosurgery

## 2023-12-30 DIAGNOSIS — Z87891 Personal history of nicotine dependence: Secondary | ICD-10-CM | POA: Diagnosis not present

## 2023-12-30 DIAGNOSIS — I2510 Atherosclerotic heart disease of native coronary artery without angina pectoris: Secondary | ICD-10-CM | POA: Insufficient documentation

## 2023-12-30 DIAGNOSIS — Z951 Presence of aortocoronary bypass graft: Secondary | ICD-10-CM | POA: Insufficient documentation

## 2023-12-30 DIAGNOSIS — M48062 Spinal stenosis, lumbar region with neurogenic claudication: Secondary | ICD-10-CM | POA: Diagnosis present

## 2023-12-30 DIAGNOSIS — Z79899 Other long term (current) drug therapy: Secondary | ICD-10-CM | POA: Diagnosis not present

## 2023-12-30 DIAGNOSIS — E119 Type 2 diabetes mellitus without complications: Secondary | ICD-10-CM | POA: Insufficient documentation

## 2023-12-30 DIAGNOSIS — I10 Essential (primary) hypertension: Secondary | ICD-10-CM

## 2023-12-30 DIAGNOSIS — M5416 Radiculopathy, lumbar region: Secondary | ICD-10-CM | POA: Insufficient documentation

## 2023-12-30 DIAGNOSIS — M4316 Spondylolisthesis, lumbar region: Secondary | ICD-10-CM | POA: Diagnosis not present

## 2023-12-30 DIAGNOSIS — Z7984 Long term (current) use of oral hypoglycemic drugs: Secondary | ICD-10-CM | POA: Insufficient documentation

## 2023-12-30 LAB — GLUCOSE, CAPILLARY
Glucose-Capillary: 132 mg/dL — ABNORMAL HIGH (ref 70–99)
Glucose-Capillary: 173 mg/dL — ABNORMAL HIGH (ref 70–99)
Glucose-Capillary: 237 mg/dL — ABNORMAL HIGH (ref 70–99)

## 2023-12-30 SURGERY — POSTERIOR LUMBAR FUSION 2 LEVEL
Anesthesia: General | Site: Spine Lumbar

## 2023-12-30 MED ORDER — AMISULPRIDE (ANTIEMETIC) 5 MG/2ML IV SOLN: 10.0000 mg | Freq: Once | INTRAVENOUS | Status: DC | PRN

## 2023-12-30 MED ORDER — LIDOCAINE 2% (20 MG/ML) 5 ML SYRINGE
INTRAMUSCULAR | Status: AC
  Filled 2023-12-30: qty 5

## 2023-12-30 MED ORDER — PROPOFOL 10 MG/ML IV BOLUS
INTRAVENOUS | Status: AC
  Filled 2023-12-30: qty 20

## 2023-12-30 MED ORDER — PHENYLEPHRINE HCL-NACL 20-0.9 MG/250ML-% IV SOLN
INTRAVENOUS | Status: DC | PRN
  Administered 2023-12-30: 80 ug via INTRAVENOUS
  Administered 2023-12-30: 40 ug/min via INTRAVENOUS

## 2023-12-30 MED ORDER — BUPIVACAINE LIPOSOME 1.3 % IJ SUSP
INTRAMUSCULAR | Status: DC | PRN
  Administered 2023-12-30: 20 mL

## 2023-12-30 MED ORDER — LIDOCAINE 2% (20 MG/ML) 5 ML SYRINGE
INTRAMUSCULAR | Status: DC | PRN
  Administered 2023-12-30: 100 mg via INTRAVENOUS

## 2023-12-30 MED ORDER — CHLORHEXIDINE GLUCONATE CLOTH 2 % EX PADS
6.0000 | MEDICATED_PAD | Freq: Once | CUTANEOUS | Status: AC
  Administered 2023-12-30: 6 via TOPICAL

## 2023-12-30 MED ORDER — CEFAZOLIN SODIUM-DEXTROSE 2-4 GM/100ML-% IV SOLN
2.0000 g | Freq: Three times a day (TID) | INTRAVENOUS | Status: AC
  Administered 2023-12-30 – 2023-12-31 (×2): 2 g via INTRAVENOUS
  Filled 2023-12-30 (×2): qty 100

## 2023-12-30 MED ORDER — SODIUM CHLORIDE 0.9% FLUSH
3.0000 mL | Freq: Two times a day (BID) | INTRAVENOUS | Status: DC
  Administered 2023-12-30 – 2023-12-31 (×3): 3 mL via INTRAVENOUS

## 2023-12-30 MED ORDER — ONDANSETRON HCL 4 MG/2ML IJ SOLN
INTRAMUSCULAR | Status: AC
  Filled 2023-12-30: qty 2

## 2023-12-30 MED ORDER — BUPIVACAINE LIPOSOME 1.3 % IJ SUSP
INTRAMUSCULAR | Status: AC
  Filled 2023-12-30: qty 20

## 2023-12-30 MED ORDER — MENTHOL 3 MG MT LOZG: 1.0000 | LOZENGE | OROMUCOSAL | Status: DC | PRN

## 2023-12-30 MED ORDER — CEFAZOLIN SODIUM-DEXTROSE 2-4 GM/100ML-% IV SOLN
2.0000 g | INTRAVENOUS | Status: AC
  Administered 2023-12-30: 2 g via INTRAVENOUS
  Filled 2023-12-30: qty 100

## 2023-12-30 MED ORDER — ACETAMINOPHEN 325 MG PO TABS
650.0000 mg | ORAL_TABLET | ORAL | Status: DC | PRN
  Administered 2023-12-31 – 2024-01-01 (×3): 650 mg via ORAL
  Filled 2023-12-30 (×2): qty 2

## 2023-12-30 MED ORDER — THROMBIN 5000 UNITS EX SOLR
OROMUCOSAL | Status: DC | PRN
  Administered 2023-12-30 (×2): 5 mL via TOPICAL

## 2023-12-30 MED ORDER — 0.9 % SODIUM CHLORIDE (POUR BTL) OPTIME
TOPICAL | Status: DC | PRN
  Administered 2023-12-30 (×2): 1000 mL

## 2023-12-30 MED ORDER — PHENYLEPHRINE 80 MCG/ML (10ML) SYRINGE FOR IV PUSH (FOR BLOOD PRESSURE SUPPORT)
PREFILLED_SYRINGE | INTRAVENOUS | Status: DC | PRN
  Administered 2023-12-30: 80 ug via INTRAVENOUS

## 2023-12-30 MED ORDER — ROCURONIUM BROMIDE 10 MG/ML (PF) SYRINGE
PREFILLED_SYRINGE | INTRAVENOUS | Status: AC
  Filled 2023-12-30: qty 10

## 2023-12-30 MED ORDER — INSULIN ASPART 100 UNIT/ML IJ SOLN
0.0000 [IU] | Freq: Three times a day (TID) | INTRAMUSCULAR | Status: DC
  Administered 2023-12-31: 7 [IU] via SUBCUTANEOUS
  Administered 2023-12-31 – 2024-01-01 (×2): 4 [IU] via SUBCUTANEOUS
  Filled 2023-12-30 (×2): qty 4
  Filled 2023-12-30: qty 5

## 2023-12-30 MED ORDER — ORAL CARE MOUTH RINSE: 15.0000 mL | Freq: Once | OROMUCOSAL | Status: AC

## 2023-12-30 MED ORDER — SODIUM CHLORIDE 0.9% FLUSH: 3.0000 mL | INTRAVENOUS | Status: DC | PRN

## 2023-12-30 MED ORDER — ACETAMINOPHEN 10 MG/ML IV SOLN: 1000.0000 mg | Freq: Once | INTRAVENOUS | Status: DC | PRN

## 2023-12-30 MED ORDER — OXYCODONE HCL 5 MG PO TABS: 5.0000 mg | ORAL_TABLET | ORAL | Status: DC | PRN

## 2023-12-30 MED ORDER — LACTATED RINGERS IV SOLN: INTRAVENOUS | Status: DC | PRN

## 2023-12-30 MED ORDER — ALBUMIN HUMAN 5 % IV SOLN: INTRAVENOUS | Status: DC | PRN

## 2023-12-30 MED ORDER — FENTANYL CITRATE (PF) 250 MCG/5ML IJ SOLN
INTRAMUSCULAR | Status: DC | PRN
  Administered 2023-12-30: 50 ug via INTRAVENOUS
  Administered 2023-12-30: 100 ug via INTRAVENOUS
  Administered 2023-12-30 (×5): 50 ug via INTRAVENOUS

## 2023-12-30 MED ORDER — OXYCODONE HCL 5 MG PO TABS: 10.0000 mg | ORAL_TABLET | ORAL | Status: DC | PRN

## 2023-12-30 MED ORDER — PHENYLEPHRINE 80 MCG/ML (10ML) SYRINGE FOR IV PUSH (FOR BLOOD PRESSURE SUPPORT)
PREFILLED_SYRINGE | INTRAVENOUS | Status: AC
  Filled 2023-12-30: qty 10

## 2023-12-30 MED ORDER — ACETAMINOPHEN 500 MG PO TABS
1000.0000 mg | ORAL_TABLET | Freq: Four times a day (QID) | ORAL | Status: AC
  Administered 2023-12-30 – 2023-12-31 (×3): 1000 mg via ORAL
  Filled 2023-12-30 (×4): qty 2

## 2023-12-30 MED ORDER — PHENYLEPHRINE HCL (PRESSORS) 10 MG/ML IV SOLN
INTRAVENOUS | Status: AC
  Filled 2023-12-30: qty 1

## 2023-12-30 MED ORDER — CYCLOBENZAPRINE HCL 10 MG PO TABS
10.0000 mg | ORAL_TABLET | Freq: Three times a day (TID) | ORAL | Status: DC | PRN
  Administered 2023-12-30 – 2023-12-31 (×2): 10 mg via ORAL
  Filled 2023-12-30 (×2): qty 1

## 2023-12-30 MED ORDER — THROMBIN (RECOMBINANT) 5000 UNITS EX SOLR
CUTANEOUS | Status: AC
  Filled 2023-12-30: qty 5000

## 2023-12-30 MED ORDER — SODIUM CHLORIDE 0.9 % IV SOLN
250.0000 mL | INTRAVENOUS | Status: AC
  Administered 2023-12-30: 250 mL via INTRAVENOUS

## 2023-12-30 MED ORDER — BISACODYL 10 MG RE SUPP: 10.0000 mg | Freq: Every day | RECTAL | Status: DC | PRN

## 2023-12-30 MED ORDER — BUPIVACAINE-EPINEPHRINE (PF) 0.5% -1:200000 IJ SOLN
INTRAMUSCULAR | Status: DC | PRN
  Administered 2023-12-30: 30 mL

## 2023-12-30 MED ORDER — BACITRACIN ZINC 500 UNIT/GM EX OINT
TOPICAL_OINTMENT | CUTANEOUS | Status: AC
  Filled 2023-12-30: qty 28.35

## 2023-12-30 MED ORDER — ACETAMINOPHEN 650 MG RE SUPP: 650.0000 mg | RECTAL | Status: DC | PRN

## 2023-12-30 MED ORDER — DEXAMETHASONE SOD PHOSPHATE PF 10 MG/ML IJ SOLN
INTRAMUSCULAR | Status: DC | PRN
  Administered 2023-12-30: 10 mg via INTRAVENOUS

## 2023-12-30 MED ORDER — VASHE WOUND IRRIGATION OPTIME
TOPICAL | Status: DC | PRN
  Administered 2023-12-30: 34 [oz_av]

## 2023-12-30 MED ORDER — PHENOL 1.4 % MT LIQD: 1.0000 | OROMUCOSAL | Status: DC | PRN

## 2023-12-30 MED ORDER — CHLORHEXIDINE GLUCONATE 0.12 % MT SOLN
15.0000 mL | Freq: Once | OROMUCOSAL | Status: AC
  Administered 2023-12-30: 15 mL via OROMUCOSAL
  Filled 2023-12-30: qty 15

## 2023-12-30 MED ORDER — FENTANYL CITRATE (PF) 100 MCG/2ML IJ SOLN: 25.0000 ug | INTRAMUSCULAR | Status: DC | PRN

## 2023-12-30 MED ORDER — BUPIVACAINE-EPINEPHRINE (PF) 0.5% -1:200000 IJ SOLN
INTRAMUSCULAR | Status: AC
  Filled 2023-12-30: qty 30

## 2023-12-30 MED ORDER — BACITRACIN ZINC 500 UNIT/GM EX OINT
TOPICAL_OINTMENT | CUTANEOUS | Status: DC | PRN
  Administered 2023-12-30: 1 via TOPICAL

## 2023-12-30 MED ORDER — ONDANSETRON HCL 4 MG/2ML IJ SOLN: 4.0000 mg | Freq: Once | INTRAMUSCULAR | Status: DC | PRN

## 2023-12-30 MED ORDER — ALFUZOSIN HCL ER 10 MG PO TB24
10.0000 mg | ORAL_TABLET | Freq: Every day | ORAL | Status: DC
  Administered 2023-12-31 – 2024-01-01 (×2): 10 mg via ORAL
  Filled 2023-12-30 (×2): qty 1

## 2023-12-30 MED ORDER — MORPHINE SULFATE (PF) 2 MG/ML IV SOLN: 2.0000 mg | INTRAVENOUS | Status: DC | PRN

## 2023-12-30 MED ORDER — NITROGLYCERIN 0.4 MG SL SUBL: 0.4000 mg | SUBLINGUAL_TABLET | SUBLINGUAL | Status: DC | PRN

## 2023-12-30 MED ORDER — ONDANSETRON HCL 4 MG/2ML IJ SOLN: 4.0000 mg | Freq: Four times a day (QID) | INTRAMUSCULAR | Status: DC | PRN

## 2023-12-30 MED ORDER — ONDANSETRON HCL 4 MG/2ML IJ SOLN
INTRAMUSCULAR | Status: DC | PRN
  Administered 2023-12-30: 4 mg via INTRAVENOUS

## 2023-12-30 MED ORDER — PROPOFOL 10 MG/ML IV BOLUS
INTRAVENOUS | Status: DC | PRN
  Administered 2023-12-30: 100 mg via INTRAVENOUS

## 2023-12-30 MED ORDER — FENTANYL CITRATE (PF) 250 MCG/5ML IJ SOLN
INTRAMUSCULAR | Status: AC
  Filled 2023-12-30: qty 5

## 2023-12-30 MED ORDER — SODIUM CHLORIDE 0.9 % IV SOLN: INTRAVENOUS | Status: DC | PRN

## 2023-12-30 MED ORDER — ONDANSETRON HCL 4 MG PO TABS: 4.0000 mg | ORAL_TABLET | Freq: Four times a day (QID) | ORAL | Status: DC | PRN

## 2023-12-30 MED ORDER — SUGAMMADEX SODIUM 200 MG/2ML IV SOLN
INTRAVENOUS | Status: DC | PRN
  Administered 2023-12-30: 200 mg via INTRAVENOUS

## 2023-12-30 MED ORDER — CHLORHEXIDINE GLUCONATE CLOTH 2 % EX PADS: 6.0000 | MEDICATED_PAD | Freq: Once | CUTANEOUS | Status: DC

## 2023-12-30 MED ORDER — LACTATED RINGERS IV SOLN: INTRAVENOUS | Status: DC

## 2023-12-30 MED ORDER — DOCUSATE SODIUM 100 MG PO CAPS
100.0000 mg | ORAL_CAPSULE | Freq: Two times a day (BID) | ORAL | Status: DC
  Administered 2023-12-30 – 2023-12-31 (×3): 100 mg via ORAL
  Filled 2023-12-30 (×3): qty 1

## 2023-12-30 MED ORDER — ROCURONIUM BROMIDE 10 MG/ML (PF) SYRINGE
PREFILLED_SYRINGE | INTRAVENOUS | Status: DC | PRN
  Administered 2023-12-30 (×2): 20 mg via INTRAVENOUS
  Administered 2023-12-30: 60 mg via INTRAVENOUS

## 2023-12-30 MED ORDER — STERILE WATER FOR IRRIGATION IR SOLN
Status: DC | PRN
  Administered 2023-12-30: 1000 mL

## 2023-12-30 SURGICAL SUPPLY — 60 items
BAG COUNTER SPONGE SURGICOUNT (BAG) ×1 IMPLANT
BASKET BONE COLLECTION (BASKET) ×1 IMPLANT
BENZOIN TINCTURE PRP APPL 2/3 (GAUZE/BANDAGES/DRESSINGS) ×1 IMPLANT
BLADE CLIPPER SURG (BLADE) IMPLANT
BUR MATCHSTICK NEURO 3.0 LAGG (BURR) ×1 IMPLANT
BUR PRECISION FLUTE 6.0 (BURR) ×1 IMPLANT
CANISTER SUCTION 3000ML PPV (SUCTIONS) ×1 IMPLANT
CAP LOCK DLX THRD (Cap) IMPLANT
CLEANSER WND VASHE INSTL 34OZ (WOUND CARE) ×1 IMPLANT
CNTNR URN SCR LID CUP LEK RST (MISCELLANEOUS) ×1 IMPLANT
COVER BACK TABLE 60X90IN (DRAPES) ×1 IMPLANT
DRAPE C-ARM 42X72 X-RAY (DRAPES) ×2 IMPLANT
DRAPE HALF SHEET 40X57 (DRAPES) ×1 IMPLANT
DRAPE LAPAROTOMY 100X72X124 (DRAPES) ×1 IMPLANT
DRAPE SURG 17X23 STRL (DRAPES) ×1 IMPLANT
DRSG OPSITE POSTOP 4X10 (GAUZE/BANDAGES/DRESSINGS) IMPLANT
DRSG OPSITE POSTOP 4X6 (GAUZE/BANDAGES/DRESSINGS) ×1 IMPLANT
ELECTRODE BLDE 4.0 EZ CLN MEGD (MISCELLANEOUS) ×1 IMPLANT
ELECTRODE REM PT RTRN 9FT ADLT (ELECTROSURGICAL) ×1 IMPLANT
EVACUATOR 1/8 PVC DRAIN (DRAIN) ×1 IMPLANT
GAUZE 4X4 16PLY ~~LOC~~+RFID DBL (SPONGE) ×1 IMPLANT
GLOVE BIO SURGEON STRL SZ 6 (GLOVE) ×1 IMPLANT
GLOVE BIO SURGEON STRL SZ8 (GLOVE) ×2 IMPLANT
GLOVE BIO SURGEON STRL SZ8.5 (GLOVE) ×2 IMPLANT
GLOVE BIOGEL PI IND STRL 6.5 (GLOVE) ×1 IMPLANT
GOWN STRL REUS W/ TWL LRG LVL3 (GOWN DISPOSABLE) ×1 IMPLANT
GOWN STRL REUS W/ TWL XL LVL3 (GOWN DISPOSABLE) ×2 IMPLANT
GOWN STRL REUS W/TWL 2XL LVL3 (GOWN DISPOSABLE) IMPLANT
GRAFT TRINITY ELITE LGE HUMAN (Tissue) IMPLANT
HEMOSTAT POWDER KIT SURGIFOAM (HEMOSTASIS) ×1 IMPLANT
KIT BASIN OR (CUSTOM PROCEDURE TRAY) ×1 IMPLANT
KIT GRAFTMAG DEL NEURO DISP (NEUROSURGERY SUPPLIES) IMPLANT
KIT POSITIONER JACKSON TABLE (MISCELLANEOUS) ×1 IMPLANT
KIT TURNOVER KIT B (KITS) ×1 IMPLANT
NDL HYPO 21X1.5 SAFETY (NEEDLE) ×1 IMPLANT
NDL HYPO 22X1.5 SAFETY MO (MISCELLANEOUS) ×1 IMPLANT
NEEDLE HYPO 21X1.5 SAFETY (NEEDLE) ×1 IMPLANT
NEEDLE HYPO 22X1.5 SAFETY MO (MISCELLANEOUS) ×1 IMPLANT
PACK LAMINECTOMY NEURO (CUSTOM PROCEDURE TRAY) ×1 IMPLANT
PAD ARMBOARD POSITIONER FOAM (MISCELLANEOUS) ×3 IMPLANT
PATTIES SURGICAL .5 X1 (DISPOSABLE) IMPLANT
PATTIES SURGICAL 1X1 (DISPOSABLE) IMPLANT
ROD CURVED TI 6.35X60 (Rod) IMPLANT
SCREW PA CREO DLX 6.5X50 (Screw) IMPLANT
SCREW PA DLX CREO 7.5X50 (Screw) IMPLANT
SOLN 0.9% NACL POUR BTL 1000ML (IV SOLUTION) ×1 IMPLANT
SOLN STERILE WATER BTL 1000 ML (IV SOLUTION) ×1 IMPLANT
SPACER ALTERA 10X31 8-12MM-8 (Spacer) IMPLANT
SPIKE FLUID TRANSFER (MISCELLANEOUS) ×1 IMPLANT
SPONGE NEURO XRAY DETECT 1X3 (DISPOSABLE) IMPLANT
SPONGE SURGIFOAM ABS GEL 100 (HEMOSTASIS) IMPLANT
SPONGE T-LAP 4X18 ~~LOC~~+RFID (SPONGE) IMPLANT
STRIP CLOSURE SKIN 1/2X4 (GAUZE/BANDAGES/DRESSINGS) ×1 IMPLANT
SUT VIC AB 1 CT1 18XBRD ANBCTR (SUTURE) ×2 IMPLANT
SUT VIC AB 2-0 CP2 18 (SUTURE) ×2 IMPLANT
SYR 20ML LL LF (SYRINGE) IMPLANT
TOWEL GREEN STERILE (TOWEL DISPOSABLE) ×1 IMPLANT
TOWEL GREEN STERILE FF (TOWEL DISPOSABLE) ×1 IMPLANT
TRAY FOLEY MTR SLVR 16FR STAT (SET/KITS/TRAYS/PACK) ×1 IMPLANT
TUBING ANTICOAG CELL SAVER (IV SETS) IMPLANT

## 2023-12-30 NOTE — H&P (Signed)
 Subjective: The patient is an 83 year old black male on whom I previously performed a lumbar laminectomy.  He has done well until recently when he developed recurrent back and left greater right leg pain consistent with neurogenic claudication.  He failed medical management and was worked up with a lumbar MRI and lumbar mild CT.  This demonstrated lumbar spine listhesis, spinal stenosis, excetra.  I discussed the various treatment of it with him.  He has decided to proceed with surgery.  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 Anner (Cardiologist): For Abnormal ST: CABG X 5; cath 03/2012 --> LIMA-LAD - patent, SVG-Ramus - anastamotic 80% - DES stent; SVG-RCA & SVG-OM1-OM2-- occluded; DES Stent to Cx-OM1   Cardiomyopathy, ischemic- moderate LVD (EF of roughly 45%) at cath 2/143 11/03/2012   Resolved - f/u Echo with EF 55-60%   Carotid artery narrowings 03/2018   Moderate L ICA 40-59%   Carpal tunnel syndrome on right    Chronic back pain    spinal stenosis and scoliosis   DDD (degenerative disc disease), lumbosacral    Diabetes type 2, controlled (HCC)    takes Metformin  once a wk   DJD (degenerative joint disease) 10/2010   Rt TKR   Dyslipidemia    takes Simvastatin  daily   First degree heart block    GERD (gastroesophageal reflux disease)    History of colon polyps    benign   History of shingles    forehead --  dec 2016--  no residual pain   Osteoarthritis    Peripheral arterial disease May 2016  (seen by dr berry)   L. ABI 0.58 with high grade L. SFA stenosis and occluded L. DP. R. ABI 0.48 with occluded R. SFA and occluded R. DP.   Pneumonia    hx of-as a child   Resistant hypertension    takes Coreg ,HCTZ,and Micardis  daily   Weakness    numbness and tingling    Past Surgical History:  Procedure Laterality Date   Carotid Dopplers  03/2018    Normal LV size and function.  EF 55 to 60%.  No R WMA.  Impaired relaxation (GR 1 DD).   Moderate LA dilation.  Moderate aortic thickening and calcification-sclerosis with no stenosis.   CARPAL TUNNEL RELEASE Right 10/10/2015   Procedure: RIGHT HAND CARPAL TUNNEL RELEASE;  Surgeon: Prentice Pagan, MD;  Location: [facility] [place];  Service: Orthopedics;  Laterality: Right;   COLONOSCOPY     CORONARY ANGIOPLASTY     3 stents   CORONARY ARTERY BYPASS GRAFT  05/22/2010  dr fleeta tright   x 5 (LIMA-LAD, SVG- RI,. SVG-OM1-OM2, SVG-dRCA)   DECOMPRESSIVE LUMBAR LAMINECTOMY LEVEL 1 N/A 03/08/2016   Procedure: Lumbar 3-5 decompression with in situ fusion;  Surgeon: Donaciano Sprang, MD;  Location: [facility] OR;  Service: Orthopedics;  Laterality: N/A;   LEFT HEART CATH AND CORONARY ANGIOGRAPHY  05/15/2010   abnormal stress test-- 3 vessel CAD/  LVEDP=27mmHg  (Dr. Mona for Dr,. Anner)    LEFT HEART CATHETERIZATION WITH CORONARY ANGIOGRAM N/A 04/02/2012   Procedure: LEFT HEART CATHETERIZATION WITH CORONARY ANGIOGRAM;  Surgeon: Alm LELON Anner, MD;  Location: [facility] CATH LAB: Occluded SVG-rPDA & native RCA, 100% SVG-OM. Severe anastomotic lesion in SVG-RI.  Patent LIMA-LAD. Severe mid Cx-OM   LUMBAR LAMINECTOMY/DECOMPRESSION MICRODISCECTOMY Bilateral 11/16/2021   Procedure: L23 LAM/FORAM;  Surgeon: Mavis Purchase, MD;  Location: [facility] OR;  Service: Neurosurgery;  Laterality: Bilateral;  3C  NM MYOVIEW  LTD  03/25/2012   A) High Risk TM Myoview  w/ apical & upper mid to basal Ing wall Scar w/ significant iscemia Inf -> Inf-Lat from apex to base/  severe LV dysfxn - infero-apical dyskinesis and HK in Inf & Lat wall, EF 33%; b) large-size, mod-intensity ~ reversible perfusion defect in Inf Wall suggesting ischemia. EF 34%. Global HK & Severe Inf HK to AK. Also - horizontal ST depression Inf-Lat. HIGH RISK   NM MYOVIEW  LTD  04/19/2021   LOW RISK.  EF 53%.  Fixed inferior perfusion defect (with normal wall motion-suspect artifact/diaphragmatic attenuation).   PERCUTANEOUS CORONARY STENT INTERVENTION (PCI-S)  N/A 04/08/2012   Procedure: PERCUTANEOUS CORONARY STENT INTERVENTION (PCI-S);  Surgeon: Alm LELON Clay, MD;  Location: [facility] CATH LAB: Promus Premier DES 2.25 mm x 12 mm - Mid Cx-OM1; Promus Premier DES 2.5 mm x 16 mm -- anastomotic SVG-RI (graft into native)   TOTAL KNEE ARTHROPLASTY Right 10/26/2010   TRANSTHORACIC ECHOCARDIOGRAM  08/21/2010   mild LVH, EF 45-50%, inferior hypokinesis,  grade 1 diastolic dysfunction/  moderate LAE/   mild MR/  trivial TR/  mild dilated RV   TRANSTHORACIC ECHOCARDIOGRAM  03/2018    Normal LV size and function.  EF 55 to 60%.  No R WMA.  Impaired relaxation (GR 1 DD).  Moderate LA dilation.  Moderate aortic thickening and calcification-sclerosis with no stenosis.   UMBILICAL HERNIA REPAIR  1980's    No Known Allergies  Social History   Tobacco Use   Smoking status: Former    Current packs/day: 0.00    Types: Cigarettes    Quit date: 09/26/1980    Years since quitting: 43.2    Passive exposure: Never   Smokeless tobacco: Never  Substance Use Topics   Alcohol use: No    Family History  Problem Relation Age of Onset   Cancer Mother    Prior to Admission medications   Medication Sig Start Date End Date Taking? Authorizing Provider  alfuzosin (UROXATRAL) 10 MG 24 hr tablet Take 10 mg by mouth daily with breakfast.   Yes [provider]  atorvastatin  (LIPITOR) 80 MG tablet TAKE 1 TABLET BY MOUTH EVERY DAY 09/27/23  Yes Clay Alm LELON, MD  carvedilol  (COREG ) 25 MG tablet TAKE 1 TABLET (25 MG TOTAL) BY MOUTH 2 (TWO) TIMES DAILY. Patient taking differently: Take 25 mg by mouth daily. 12/17/17  Yes Clay Alm LELON, MD  chlorthalidone  (HYGROTON ) 50 MG tablet TAKE 1 TABLET BY MOUTH EVERY DAY 03/21/23  Yes Clay Alm LELON, MD  Cholecalciferol (VITAMIN D3) 50 MCG (2000 UT) CAPS Take 2,000 Units by mouth daily.   Yes [provider]  clopidogrel  (PLAVIX ) 75 MG tablet TAKE 1 TABLET BY MOUTH EVERY DAY 09/24/23  Yes Clay Alm LELON, MD  ezetimibe   (ZETIA ) 10 MG tablet TAKE 1 TABLET BY MOUTH EVERY DAY 05/30/23  Yes Clay Alm LELON, MD  lidocaine  (LIDODERM ) 5 % Place 1 patch onto the skin daily. 12/17/23  Yes [provider]  metFORMIN  (GLUCOPHAGE ) 500 MG tablet Take 500 mg by mouth daily.   Yes [provider]  OMEGA-3 FATTY ACIDS PO Take 1,200 mg by mouth daily.   Yes [provider]  spironolactone (ALDACTONE) 25 MG tablet Take 25 mg by mouth daily.   Yes [provider]  telmisartan  (MICARDIS ) 80 MG tablet TAKE 1 TABLET BY MOUTH EVERY DAY Patient taking differently: Take 80 mg by mouth in the morning and at bedtime. 07/01/23  Yes  Anner Alm ORN, MD  cyclobenzaprine  (FLEXERIL ) 5 MG tablet Take 1 tablet (5 mg total) by mouth 3 (three) times daily as needed for muscle spasms. Patient not taking: Reported on 12/20/2023 11/17/21   Mavis Purchase, MD  docusate sodium  (COLACE) 100 MG capsule Take 1 capsule (100 mg total) by mouth 2 (two) times daily. Patient not taking: Reported on 12/20/2023 11/17/21   Mavis Purchase, MD  hydrocortisone cream 0.5 % Apply 1 Application topically daily as needed for itching.    [provider]  Menthol , Topical Analgesic, (ABSORBINE PLUS JR EX) Apply 1 application  topically daily as needed (Pain).    [provider]  naproxen  sodium (ALEVE ) 220 MG tablet Take 220 mg by mouth daily as needed (pain).    [provider]  nitroGLYCERIN  (NITROSTAT ) 0.4 MG SL tablet Place 1 tablet (0.4 mg total) under the tongue every 5 (five) minutes as needed for chest pain. Patient not taking: Reported on 12/20/2023 04/09/12   Maxie Herlene POUR, PA-C  oxyCODONE -acetaminophen  (PERCOCET/ROXICET) 5-325 MG tablet Take 1-2 tablets by mouth every 4 (four) hours as needed for moderate pain. Patient not taking: Reported on 05/07/2023 11/17/21   Mavis Purchase, MD     Review of Systems  Positive ROS: As above  All other systems have been reviewed and were otherwise negative with  the exception of those mentioned in the HPI and as above.  Objective: Vital signs in last 24 hours: Temp:  [98 F (36.7 C)] 98 F (36.7 C) (11/17 0609) Pulse Rate:  [66] 66 (11/17 0609) Resp:  [18] 18 (11/17 0609) BP: (147)/(64) 147/64 (11/17 0609) SpO2:  [100 %] 100 % (11/17 0609) Weight:  [90.7 kg] 90.7 kg (11/17 0609) Estimated body mass index is 25.68 kg/m as calculated from the following:   Height as of this encounter: 6' 2 (1.88 m).   Weight as of this encounter: 90.7 kg.   General Appearance: Alert Head: Normocephalic, without obvious abnormality, atraumatic Eyes: PERRL, conjunctiva/corneas clear, EOM's intact,    Ears: Normal  Throat: Normal  Neck: Supple, Back: His lumbar incision is well-healed. Lungs: Clear to auscultation bilaterally, respirations unlabored Heart: Regular rate and rhythm, no murmur, rub or gallop Abdomen: Soft, non-tender Extremities: Extremities normal, atraumatic, no cyanosis or edema Skin: unremarkable  NEUROLOGIC:   Mental status: alert and oriented,Motor Exam - grossly normal Sensory Exam - grossly normal Reflexes:  Coordination - grossly normal Gait - grossly normal Balance - grossly normal Cranial Nerves: I: smell Not tested  II: visual acuity  OS: Normal  OD: Normal   II: visual fields Full to confrontation  II: pupils Equal, round, reactive to light  III,VII: ptosis None  III,IV,VI: extraocular muscles  Full ROM  V: mastication Normal  V: facial light touch sensation  Normal  V,VII: corneal reflex  Present  VII: facial muscle function - upper  Normal  VII: facial muscle function - lower Normal  VIII: hearing Not tested  IX: soft palate elevation  Normal  IX,X: gag reflex Present  XI: trapezius strength  5/5  XI: sternocleidomastoid strength 5/5  XI: neck flexion strength  5/5  XII: tongue strength  Normal    Data Review Lab Results  Component Value Date   WBC 6.7 12/25/2023   HGB 10.6 (L) 12/25/2023   HCT 32.9 (L)  12/25/2023   MCV 83.5 12/25/2023   PLT 182 12/25/2023   Lab Results  Component Value Date   NA 134 (L) 12/25/2023   K 4.2 12/25/2023  CL 103 12/25/2023   CO2 19 (L) 12/25/2023   BUN 21 12/25/2023   CREATININE 1.46 (H) 12/25/2023   GLUCOSE 97 12/25/2023   Lab Results  Component Value Date   INR 1.02 04/08/2012    Assessment/Plan: Lumbar spondylolisthesis, lumbar spinal stenosis, lumbar facet arthropathy, lumbar radiculopathy, neurogenic claudication, lumbago: I have discussed the situation with the patient.  I reviewed his imaging studies with him and pointed out the abnormalities.  We have discussed the various treatment options including surgery.  I have described the surgical treatment option of redo L2-3 and L3-4 laminectomy with instrumentation and fusion.  I have shown him surgical models.  I have given him a surgical pamphlet.  We have discussed the risk, benefits, alternatives, expected postoperative course, and likelihood of achieving our goals with surgery.  I have answered all the patient's questions.  He has decided proceed with surgery.   Reyes JONETTA Budge 12/30/2023 7:22 AM

## 2023-12-30 NOTE — Transfer of Care (Signed)
 Immediate Anesthesia Transfer of Care Note  Patient: [name]  Procedure(s) Performed: POSTERIOR LUMBAR INTERBODY FUSION, INTERBODY PROSTHESIS, POSTERIOR INSTRUMENTATION, LUMBAR TWO - LUMBAR THREE, LUMBAR THREE - LUMBAR FOUR (Spine Lumbar)  Patient Location: PACU  Anesthesia Type:General  Level of Consciousness: awake and alert   Airway & Oxygen Therapy: Patient connected to face mask oxygen  Post-op Assessment: Report given to RN and Post -op Vital signs reviewed and stable  Post vital signs: Reviewed and stable  Last Vitals:  Vitals Value Taken Time  BP 145/75 12/30/23 13:33  Temp    Pulse 63 12/30/23 13:39  Resp 8 12/30/23 13:39  SpO2 100 % 12/30/23 13:39  Vitals shown include unfiled device data.  Last Pain:  Vitals:   12/30/23 0709  TempSrc:   PainSc: 0-No pain         Complications: No notable events documented.

## 2023-12-30 NOTE — Op Note (Signed)
 Brief history: The patient is an 83 year old black male who has had previous back surgeries.  He has complained of back and bilateral leg pain.  He failed medical management and was worked up with lumbar x-rays and a lumbar MRI which demonstrated severe spinal stenosis at L2-3 and more moderate stenosis at L3-4 with multilevel degenerative changes.  I discussed the various treatment options with him.  He has decided proceed with surgery.  Preoperative diagnosis: Lumbar spondylolisthesis, lumbar degenerative disc disease, spinal stenosis compressing  the L2, L3 and the and L4 nerve roots; lumbago; lumbar radiculopathy; neurogenic claudication  Postoperative diagnosis: Same  Procedure: Redo bilateral L2-3 and L3-4 laminotomy/foraminotomies/medial facetectomy to decompress the bilateral L2, L3 and L4 nerve roots(the work required to do this was in addition to the work required to do the posterior lumbar interbody fusion because of the patient's recurrent spinal stenosis, previous surgery, facet arthropathy. Etc. requiring a wide decompression of the nerve roots.); left L2-3 transforaminal lumbar interbody fusion with local morselized autograft bone and Trinity bone graft extender; insertion of interbody prosthesis at L2-3 (globus peek expandable interbody prosthesis); posterior segmental instrumentation from L2 to L4 with globus titanium pedicle screws and rods; posterior lateral arthrodesis at L2-3 and L3-4 with local morselized autograft bone and Trinity bone graft extender.  Surgeon: Dr. Chyrl Budge  Asst.: Dr. Victory Gens and Duwaine Beck, NP  Anesthesia: Gen. endotracheal  Estimated blood loss: 300 cc  Drains: Medium Hemovac drain in the epidural space  Complications: None  Description of procedure: The patient was brought to the operating room by the anesthesia team. General endotracheal anesthesia was induced. The patient was turned to the prone position on the Wilson frame. The patient's  lumbosacral region was then prepared with Betadine scrub and Betadine solution. Sterile drapes were applied.  I then injected the area to be incised with Marcaine  with epinephrine  solution. I then used the scalpel to make a linear midline incision over the L2-3 and L3-4 interspace. I then used electrocautery to perform a bilateral subperiosteal dissection exposing the spinous process and lamina of T3 and L3-4. We then obtained intraoperative radiograph to confirm our location. We then inserted the Verstrac retractor to provide exposure.  I began the decompression by using the high speed drill to perform redo laminotomies at L2-3 and L3-4 bilaterally. We then used the Kerrison punches to widen the laminotomy and removed the epidural scar tissue and residual ligamentum flavum at L2-3 and L3-4 bilaterally.  We completed a L3 hemilaminectomy with a Kerrison punch. We used the Kerrison punches to remove the medial facets at L2-3 and L3-4 bilaterally. We performed wide foraminotomies about the bilateral L2, L3 and L4 nerve roots completing the decompression.  We now turned our attention to the posterior lumbar interbody fusion. I used a scalpel to incise the intervertebral disc at L2-3 bilaterally.  I attempted to incise the L3-4 intervertebral disc.  It had fused.. I then performed a partial intervertebral discectomy at L2-3 bilaterally using the pituitary forceps. We prepared the vertebral endplates at L2-3 bilaterally for the fusion by removing the soft tissues with the curettes. We then used the trial spacers to pick the appropriate sized interbody prosthesis. We prefilled his prosthesis with a combination of local morselized autograft bone that we obtained during the decompression as well as Trinity bone graft extender. We inserted the prefilled prosthesis into the interspace at T3 from the left, we then turned and expanded the prosthesis. There was a good snug fit of the prosthesis  in the interspace. We then  filled and the remainder of the intervertebral disc space with local morselized autograft bone and Zimmer DBM. This completed the posterior lumbar interbody arthrodesis.  During the decompression and insertion of the prosthesis the assistant protected the thecal sac and nerve roots with the D'Errico retractor.  We now turned attention to the instrumentation. Under fluoroscopic guidance we cannulated the bilateral 2, L3 and L4 pedicles with the bone probe. We then removed the bone probe. We then tapped the pedicle with a 5.5 and 6.5 millimeter tap. We then removed the tap. We probed inside the tapped pedicle with a ball probe to rule out cortical breaches. We then inserted a 6.5 and 7.5 x 50 millimeter pedicle screw into the L2, L3 and L4 pedicles bilaterally under fluoroscopic guidance. We then palpated along the medial aspect of the pedicles to rule out cortical breaches. There were none. The nerve roots were not injured. We then connected the unilateral pedicle screws with a lordotic rod. We compressed the construct and secured the rod in place with the caps. We then tightened the caps appropriately. This completed the instrumentation from L2-L4 bilaterally.  We now turned our attention to the posterior lateral arthrodesis at 2 3 and L3-4 bilaterally. We used the high-speed drill to decorticate the remainder of the facets, pars, transverse process at T3 and L3-4 bilaterally. We then applied a combination of local morselized autograft bone and Trinity bone graft extender over these decorticated posterior lateral structures. This completed the posterior lateral arthrodesis at L2-3 and L3-4 bilaterally.  We then obtained hemostasis using bipolar electrocautery. We irrigated the wound out with vashe solution. We inspected the thecal sac and nerve roots and noted they were well decompressed. We then removed the retractor.  We injected Exparel  .  We placed a medium Hemovac drain in the epidural space and tunneled  it out through a separate stab wound.  We reapproximated patient's thoracolumbar fascia with interrupted #1 Vicryl suture. We reapproximated patient's subcutaneous tissue with interrupted 2-0 Vicryl suture. The reapproximated patient's skin with Steri-Strips and benzoin. The wound was then coated with bacitracin  ointment. A sterile dressing was applied. The drapes were removed. The patient was subsequently returned to the supine position where they were extubated by the anesthesia team. He was then transported to the post anesthesia care unit in stable condition. All sponge instrument and needle counts were reportedly correct at the end of this case.

## 2023-12-30 NOTE — Progress Notes (Signed)
 Orthopedic Tech Progress Note Patient Details:  [name] Sep 19, 1940 [id number]  Ortho Devices Type of Ortho Device: Lumbar corsett Ortho Device/Splint Location: Back Ortho Device/Splint Interventions: Ordered   Post Interventions Patient Tolerated: Well  [name] A [name] 12/30/2023, 6:26 PM

## 2023-12-30 NOTE — Progress Notes (Signed)
 Rectal probe slowly gets pushed out some of rectum, although unable to get oral temp will continue to compare.

## 2023-12-30 NOTE — Anesthesia Procedure Notes (Signed)
 Arterial Line Insertion Start/End11/17/2025 7:20 AM, 08/29/2023 7:25 AM Performed by: Obadiah Reyes BROCKS, CRNA, anesthesiologist  Preanesthetic checklist: patient identified, IV checked, site marked, risks and benefits discussed, surgical consent, monitors and equipment checked, pre-op evaluation, timeout performed and anesthesia consent radial was placed Catheter size: 20 G Hand hygiene performed , maximum sterile barriers used  and Seldinger technique used Allen's test indicative of satisfactory collateral circulation Attempts: 1 Ultrasound Notes:relevant anatomy identified, ultrasound used to visualize needle entry and vessel patent under ultrasound. Following insertion, dressing applied and Biopatch. Post procedure assessment: normal  Patient tolerated the procedure well with no immediate complications.

## 2023-12-30 NOTE — Anesthesia Procedure Notes (Signed)
 Procedure Name: Intubation Date/Time: 12/30/2023 7:55 AM  Performed by: [name], CRNAPre-anesthesia Checklist: Patient identified, Emergency Drugs available, Suction available and Patient being monitored Patient Re-evaluated:Patient Re-evaluated prior to induction Oxygen Delivery Method: Circle System Utilized Preoxygenation: Pre-oxygenation with 100% oxygen Induction Type: IV induction Ventilation: Mask ventilation without difficulty Laryngoscope Size: Miller and 2 Grade View: Grade I Tube type: Oral Tube size: 7.5 mm Number of attempts: 1 Airway Equipment and Method: Stylet and Oral airway Placement Confirmation: ETT inserted through vocal cords under direct vision, positive ETCO2 and breath sounds checked- equal and bilateral Secured at: 23 cm Tube secured with: Tape Dental Injury: Teeth and Oropharynx as per pre-operative assessment

## 2023-12-31 DIAGNOSIS — M48062 Spinal stenosis, lumbar region with neurogenic claudication: Secondary | ICD-10-CM | POA: Diagnosis not present

## 2023-12-31 LAB — GLUCOSE, CAPILLARY
Glucose-Capillary: 187 mg/dL — ABNORMAL HIGH (ref 70–99)
Glucose-Capillary: 112 mg/dL — ABNORMAL HIGH (ref 70–99)
Glucose-Capillary: 273 mg/dL — ABNORMAL HIGH (ref 70–99)
Glucose-Capillary: 211 mg/dL — ABNORMAL HIGH (ref 70–99)

## 2023-12-31 MED ORDER — DEXAMETHASONE SODIUM PHOSPHATE 4 MG/ML IJ SOLN
4.0000 mg | Freq: Four times a day (QID) | INTRAMUSCULAR | Status: AC
  Administered 2023-12-31 (×2): 4 mg via INTRAVENOUS
  Filled 2023-12-31 (×2): qty 1

## 2023-12-31 MED ORDER — GABAPENTIN 300 MG PO CAPS
300.0000 mg | ORAL_CAPSULE | Freq: Three times a day (TID) | ORAL | Status: DC
  Administered 2023-12-31 (×3): 300 mg via ORAL
  Filled 2023-12-31 (×3): qty 1

## 2023-12-31 MED ORDER — INSULIN ASPART 100 UNIT/ML IJ SOLN
0.0000 [IU] | Freq: Every day | INTRAMUSCULAR | Status: DC
  Administered 2023-12-31: 2 [IU] via SUBCUTANEOUS
  Filled 2023-12-31: qty 2

## 2023-12-31 MED FILL — Thrombin (Recombinant) For Soln 5000 Unit: CUTANEOUS | Qty: 5000 | Status: AC

## 2023-12-31 NOTE — Progress Notes (Signed)
 Subjective: The patient is alert and pleasant.  His wife is at the bedside.  He complains of some left thigh pain improves upon walking.  Objective: Vital signs in last 24 hours: Temp:  [93.7 F (34.3 C)-98.6 F (37 C)] 98.6 F (37 C) (11/18 0733) Pulse Rate:  [53-89] 73 (11/18 0733) Resp:  [7-23] 18 (11/18 0733) BP: (133-179)/(56-81) 149/70 (11/18 0733) SpO2:  [92 %-100 %] 100 % (11/18 0733) Estimated body mass index is 25.68 kg/m as calculated from the following:   Height as of this encounter: 6' 2 (1.88 m).   Weight as of this encounter: 90.7 kg.   Intake/Output from previous day: 11/17 0701 - 11/18 0700 In: 3130 [P.O.:480; I.V.:2000; Blood:150; IV Piggyback:500] Out: 1540 [Urine:790; Drains:400; Blood:350] Intake/Output this shift: No intake/output data recorded.  Physical exam the patient is alert and oriented.  He is moving his lower extremities well.  He has weakness in his left EHL.  Lab Results: No results for input(s): WBC, HGB, HCT, PLT in the last 72 hours. BMET No results for input(s): NA, K, CL, CO2, GLUCOSE, BUN, CREATININE, CALCIUM  in the last 72 hours.  Studies/Results: DG Lumbar Spine 1 View Result Date: 12/30/2023 CLINICAL DATA:  Elective surgery.  Intraop imaging EXAM: LUMBAR SPINE - 1 VIEW COMPARISON:  Preoperative imaging. FINDINGS: Portable cross-table view of the lumbar spine submitted from the operating room. Surgical instruments localize posteriorly at the L3 level. Instruments also project over the lumbosacral junction. IMPRESSION: Intraoperative localization. Electronically Signed   By: Andrea Gasman M.D.   On: 12/30/2023 13:08   DG Lumbar Spine 2-3 Views Result Date: 12/30/2023 CLINICAL DATA:  Elective surgery. EXAM: LUMBAR SPINE - 2-3 VIEW COMPARISON:  Preoperative imaging FINDINGS: Two fluoroscopic spot views of the lumbar spine submitted from the operating room. Pedicle screws L2 through L4. L2-L3 interbody spacer.  Fluoroscopy time 16 seconds. Dose 14.89 mGy. IMPRESSION: Intraoperative fluoroscopy during lumbar fusion. Electronically Signed   By: Andrea Gasman M.D.   On: 12/30/2023 13:07   DG C-Arm 1-60 Min-No Report Result Date: 12/30/2023 Fluoroscopy was utilized by the requesting physician.  No radiographic interpretation.   DG C-Arm 1-60 Min-No Report Result Date: 12/30/2023 Fluoroscopy was utilized by the requesting physician.  No radiographic interpretation.    Assessment/Plan: Postop day #1: We will mobilize the patient with PT.  I will start Neurontin and give a couple doses of Decadron  for his left leg pain.  He will likely go home tomorrow.  LOS: 0 days     [name] 12/31/2023, 7:55 AM     Patient ID: [name], male   DOB: 12/30/1940, 83 y.o.   MRN: [id number]

## 2023-12-31 NOTE — Evaluation (Signed)
 Physical Therapy Evaluation  Patient Details Name: [name] MRN: [id number] DOB: [birth year] Today's Date: 12/31/2023  History of Present Illness  Pt is an 83 y/o male who presents s/p L2-L4 PLIF on 12/30/2023. PMH signficant for DM, OA, PAD, CABG, R TKA.  Clinical Impression  Pt admitted with above diagnosis. At the time of PT eval, pt was able to demonstrate transfers and ambulation with gross CGA to min assist and RW for support. Pt was educated on precautions, brace application/wearing schedule, appropriate activity progression, and car transfer. Pt currently with functional limitations due to the deficits listed below (see PT Problem List). Pt will benefit from skilled PT to increase their independence and safety with mobility to allow discharge to the venue listed below.          If plan is discharge home, recommend the following: A little help with walking and/or transfers;A little help with bathing/dressing/bathroom;Assistance with cooking/housework;Assist for transportation   Can travel by private vehicle        Equipment Recommendations Rolling walker (2 wheels)  Recommendations for Other Services       Functional Status Assessment Patient has had a recent decline in their functional status and demonstrates the ability to make significant improvements in function in a reasonable and predictable amount of time.     Precautions / Restrictions Precautions Precautions: Fall;Back Precaution Booklet Issued: Yes (comment) Recall of Precautions/Restrictions: Intact Precaution/Restrictions Comments: Reviewed handout and pt was cued for precautions during functional mobility. Required Braces or Orthoses: Spinal Brace Spinal Brace: Lumbar corset;Applied in sitting position Restrictions Weight Bearing Restrictions Per Provider Order: No      Mobility  Bed Mobility Overal bed mobility: Needs Assistance Bed Mobility: Rolling, Sidelying to Sit, Sit to Sidelying Rolling:  Contact guard assist Sidelying to sit: Contact guard assist     Sit to sidelying: Min assist General bed mobility comments: Assist for LE elevation back up into bed at end of session. HOB flat and rails lowered to simulate home environment.    Transfers Overall transfer level: Needs assistance Equipment used: Rolling walker (2 wheels) Transfers: Sit to/from Stand Sit to Stand: Contact guard assist           General transfer comment: VC's for hand placement on seated surface for safety.    Ambulation/Gait Ambulation/Gait assistance: Contact guard assist Gait Distance (Feet): 300 Feet Assistive device: Rolling walker (2 wheels) Gait Pattern/deviations: Step-through pattern, Decreased stride length, Trunk flexed Gait velocity: Decreased Gait velocity interpretation: 1.31 - 2.62 ft/sec, indicative of limited community ambulator   General Gait Details: VC's for improved posture, closer walker proximity and forward gaze. No assist required and no overt LOB noted. Hands on guarding throughout for safety.  Stairs            Wheelchair Mobility     Tilt Bed    Modified Rankin (Stroke Patients Only)       Balance Overall balance assessment: Needs assistance Sitting-balance support: Feet supported, No upper extremity supported Sitting balance-Leahy Scale: Fair     Standing balance support: No upper extremity supported, During functional activity, Reliant on assistive device for balance Standing balance-Leahy Scale: Poor                               Pertinent Vitals/Pain Pain Assessment Pain Assessment: Faces Faces Pain Scale: Hurts little more Pain Location: back/incision site Pain Descriptors / Indicators: Operative site guarding, Sore Pain Intervention(s): Limited activity  within patient's tolerance, Monitored during session, Repositioned    Home Living Family/patient expects to be discharged to:: Private residence Living Arrangements:  Spouse/significant other Available Help at Discharge: Family;Available 24 hours/day Type of Home: House Home Access: Level entry       Home Layout: Two level;Able to live on main level with bedroom/bathroom (Stair lift to basement) Home Equipment: Cane - single point;Shower seat - built in      Prior Function Prior Level of Function : Independent/Modified Independent             Mobility Comments: Significant trunk flexion PTA per wife's report       Extremity/Trunk Assessment   Upper Extremity Assessment Upper Extremity Assessment: Overall WFL for tasks assessed    Lower Extremity Assessment Lower Extremity Assessment: Generalized weakness;LLE deficits/detail LLE Deficits / Details: Noted foot drop. No active DF    Cervical / Trunk Assessment Cervical / Trunk Assessment: Back Surgery  Communication   Communication Communication: No apparent difficulties    Cognition Arousal: Alert Behavior During Therapy: WFL for tasks assessed/performed   PT - Cognitive impairments: No apparent impairments                         Following commands: Intact       Cueing Cueing Techniques: Verbal cues, Gestural cues     General Comments      Exercises     Assessment/Plan    PT Assessment Patient needs continued PT services  PT Problem List Decreased strength;Decreased activity tolerance;Decreased balance;Decreased mobility;Decreased knowledge of use of DME;Decreased safety awareness;Decreased knowledge of precautions;Pain       PT Treatment Interventions DME instruction;Gait training;Stair training;Functional mobility training;Therapeutic activities;Therapeutic exercise;Balance training;Patient/family education    PT Goals (Current goals can be found in the Care Plan section)  Acute Rehab PT Goals Patient Stated Goal: Home tomorrow PT Goal Formulation: With patient/family Time For Goal Achievement: 01/07/24 Potential to Achieve Goals: Good     Frequency Min 5X/week     Co-evaluation               AM-PAC PT 6 Clicks Mobility  Outcome Measure Help needed turning from your back to your side while in a flat bed without using bedrails?: A Little Help needed moving from lying on your back to sitting on the side of a flat bed without using bedrails?: A Little Help needed moving to and from a bed to a chair (including a wheelchair)?: A Little Help needed standing up from a chair using your arms (e.g., wheelchair or bedside chair)?: A Little Help needed to walk in hospital room?: A Little Help needed climbing 3-5 steps with a railing? : A Little 6 Click Score: 18    End of Session Equipment Utilized During Treatment: Gait belt;Back brace Activity Tolerance: Patient tolerated treatment well Patient left: in bed;with call bell/phone within reach;with family/visitor present (Bed in chair position) Nurse Communication: Mobility status PT Visit Diagnosis: Unsteadiness on feet (R26.81);Pain Pain - part of body:  (back)    Time: 9053-8981 PT Time Calculation (min) (ACUTE ONLY): 32 min   Charges:   PT Evaluation $PT Eval Low Complexity: 1 Low PT Treatments $Gait Training: 8-22 mins PT General Charges $$ ACUTE PT VISIT: 1 Visit         Leita Sable, PT, DPT Acute Rehabilitation Services Secure Chat Preferred Office: [phone]   Leita JONETTA Sable 12/31/2023, 11:55 AM

## 2023-12-31 NOTE — Anesthesia Postprocedure Evaluation (Signed)
 Anesthesia Post Note  Patient: [name]  Procedure(s) Performed: POSTERIOR LUMBAR INTERBODY FUSION, INTERBODY PROSTHESIS, POSTERIOR INSTRUMENTATION, LUMBAR TWO - LUMBAR THREE, LUMBAR THREE - LUMBAR FOUR (Spine Lumbar)     Patient location during evaluation: PACU Anesthesia Type: General Level of consciousness: awake Pain management: pain level controlled Vital Signs Assessment: post-procedure vital signs reviewed and stable Respiratory status: spontaneous breathing, nonlabored ventilation and respiratory function stable Cardiovascular status: blood pressure returned to baseline and stable Postop Assessment: no apparent nausea or vomiting Anesthetic complications: no   No notable events documented.  Last Vitals:  Vitals:   12/30/23 2252 12/31/23 0253  BP: (!) 166/71 (!) 160/69  Pulse: 83 80  Resp: 18 18  Temp: 36.8 C 36.8 C  SpO2: 100% 100%    Last Pain:  Vitals:   12/31/23 0253  TempSrc: Oral  PainSc:                  Bernardino SQUIBB Oskar Cretella

## 2024-01-01 DIAGNOSIS — M48062 Spinal stenosis, lumbar region with neurogenic claudication: Secondary | ICD-10-CM | POA: Diagnosis not present

## 2024-01-01 LAB — GLUCOSE, CAPILLARY: Glucose-Capillary: 157 mg/dL — ABNORMAL HIGH (ref 70–99)

## 2024-01-01 MED ORDER — CYCLOBENZAPRINE HCL 5 MG PO TABS: 5.0000 mg | ORAL_TABLET | Freq: Three times a day (TID) | ORAL | 0 refills | Status: AC | PRN

## 2024-01-01 MED ORDER — GABAPENTIN 300 MG PO CAPS: 300.0000 mg | ORAL_CAPSULE | Freq: Three times a day (TID) | ORAL | 0 refills | Status: AC

## 2024-01-01 MED ORDER — OXYCODONE-ACETAMINOPHEN 5-325 MG PO TABS: 1.0000 | ORAL_TABLET | ORAL | 0 refills | Status: AC | PRN

## 2024-01-01 MED FILL — Heparin Sodium (Porcine) Inj 1000 Unit/ML: INTRAMUSCULAR | Qty: 30 | Status: AC

## 2024-01-01 MED FILL — Sodium Chloride IV Soln 0.9%: INTRAVENOUS | Qty: 1000 | Status: AC

## 2024-01-01 NOTE — Progress Notes (Signed)
 Subjective: The patient is alert and pleasant.  He is in no apparent distress.  He looks well.  His wife is at the bedside.  He took a fall last night but does not feel he injured himself.  Objective: Vital signs in last 24 hours: Temp:  [97.7 F (36.5 C)-98.9 F (37.2 C)] 97.7 F (36.5 C) (11/19 0544) Pulse Rate:  [73-96] 85 (11/19 0544) Resp:  [18] 18 (11/19 0544) BP: (143-165)/(66-75) 165/71 (11/19 0544) SpO2:  [99 %-100 %] 100 % (11/19 0544) Estimated body mass index is 25.68 kg/m as calculated from the following:   Height as of this encounter: 6' 2 (1.88 m).   Weight as of this encounter: 90.7 kg.   Intake/Output from previous day: 11/18 0701 - 11/19 0700 In: 240 [P.O.:240] Out: 165 [Drains:165] Intake/Output this shift: No intake/output data recorded.  Physical exam the patient is alert and pleasant.  He is moving his lower extremities well.  He has chronic left foot drop.  Lab Results: No results for input(s): WBC, HGB, HCT, PLT in the last 72 hours. BMET No results for input(s): NA, K, CL, CO2, GLUCOSE, BUN, CREATININE, CALCIUM  in the last 72 hours.  Studies/Results: DG Lumbar Spine 1 View Result Date: 12/30/2023 CLINICAL DATA:  Elective surgery.  Intraop imaging EXAM: LUMBAR SPINE - 1 VIEW COMPARISON:  Preoperative imaging. FINDINGS: Portable cross-table view of the lumbar spine submitted from the operating room. Surgical instruments localize posteriorly at the L3 level. Instruments also project over the lumbosacral junction. IMPRESSION: Intraoperative localization. Electronically Signed   By: Andrea Gasman M.D.   On: 12/30/2023 13:08   DG Lumbar Spine 2-3 Views Result Date: 12/30/2023 CLINICAL DATA:  Elective surgery. EXAM: LUMBAR SPINE - 2-3 VIEW COMPARISON:  Preoperative imaging FINDINGS: Two fluoroscopic spot views of the lumbar spine submitted from the operating room. Pedicle screws L2 through L4. L2-L3 interbody spacer. Fluoroscopy  time 16 seconds. Dose 14.89 mGy. IMPRESSION: Intraoperative fluoroscopy during lumbar fusion. Electronically Signed   By: Andrea Gasman M.D.   On: 12/30/2023 13:07   DG C-Arm 1-60 Min-No Report Result Date: 12/30/2023 Fluoroscopy was utilized by the requesting physician.  No radiographic interpretation.   DG C-Arm 1-60 Min-No Report Result Date: 12/30/2023 Fluoroscopy was utilized by the requesting physician.  No radiographic interpretation.    Assessment/Plan: Postop day #2: We will mobilize the patient with PT.  I discussed inpatient rehab versus skilled nursing facility but he was not interested .  He prefers to go home and have home PT.  I gave him his discharge instructions and answered all their questions.  We will likely send him home later on today.  LOS: 0 days     Reyes JONETTA Budge 01/01/2024, 8:44 AM     Patient ID: [name], male   DOB: 1940-05-10, 83 y.o.   MRN: [id number]

## 2024-01-01 NOTE — Discharge Summary (Signed)
 Physician Discharge Summary     Providing Compassionate, Quality Care - Together   Patient ID: [name] MRN: [id number] DOB/AGE: 02-23-1940 83 y.o.  Admit date: 12/30/2023 Discharge date: 01/01/2024  Admission Diagnoses: Lumbar spondylolisthesis  Discharge Diagnoses:  Principal Problem:   Spondylolisthesis of lumbar region   Discharged Condition: good  Hospital Course: Patient underwent an L2-3, L3-4 TLIF by Dr. Mavis on 12/30/2023. He was admitted to 3C08 following recovery from anesthesia in the PACU. His postoperative course has been uncomplicated. He has worked with both physical and occupational therapies who feel the patient is ready for discharge home with Home Health therapies. He is ambulating with the aid of a rolling walker with contact guard assist. He is tolerating a normal diet. He is not having any bowel or bladder dysfunction. His pain is reasonably controlled with oral pain medication. He is ready for discharge home with Home Health services.   Consults: PT/OT/TOC  Significant Diagnostic Studies: radiology: DG Lumbar Spine 1 View Result Date: 12/30/2023 CLINICAL DATA:  Elective surgery.  Intraop imaging EXAM: LUMBAR SPINE - 1 VIEW COMPARISON:  Preoperative imaging. FINDINGS: Portable cross-table view of the lumbar spine submitted from the operating room. Surgical instruments localize posteriorly at the L3 level. Instruments also project over the lumbosacral junction. IMPRESSION: Intraoperative localization. Electronically Signed   By: Andrea Gasman M.D.   On: 12/30/2023 13:08   DG Lumbar Spine 2-3 Views Result Date: 12/30/2023 CLINICAL DATA:  Elective surgery. EXAM: LUMBAR SPINE - 2-3 VIEW COMPARISON:  Preoperative imaging FINDINGS: Two fluoroscopic spot views of the lumbar spine submitted from the operating room. Pedicle screws L2 through L4. L2-L3 interbody spacer. Fluoroscopy time 16 seconds. Dose 14.89 mGy. IMPRESSION: Intraoperative fluoroscopy  during lumbar fusion. Electronically Signed   By: Andrea Gasman M.D.   On: 12/30/2023 13:07   DG C-Arm 1-60 Min-No Report Result Date: 12/30/2023 Fluoroscopy was utilized by the requesting physician.  No radiographic interpretation.   DG C-Arm 1-60 Min-No Report Result Date: 12/30/2023 Fluoroscopy was utilized by the requesting physician.  No radiographic interpretation.     Treatments: surgery: Redo bilateral L2-3 and L3-4 laminotomy/foraminotomies/medial facetectomy to decompress the bilateral L2, L3 and L4 nerve roots(the work required to do this was in addition to the work required to do the posterior lumbar interbody fusion because of the patient's recurrent spinal stenosis, previous surgery, facet arthropathy. Etc. requiring a wide decompression of the nerve roots.); left L2-3 transforaminal lumbar interbody fusion with local morselized autograft bone and Trinity bone graft extender; insertion of interbody prosthesis at L2-3 (globus peek expandable interbody prosthesis); posterior segmental instrumentation from L2 to L4 with globus titanium pedicle screws and rods; posterior lateral arthrodesis at L2-3 and L3-4 with local morselized autograft bone and Trinity bone graft extender.   Discharge Exam: Blood pressure (!) 165/71, pulse 85, temperature 97.7 F (36.5 C), temperature source Oral, resp. rate 18, height 6' 2 (1.88 m), weight 90.7 kg, SpO2 100%.  Per report: Physical exam the patient is alert and pleasant.  He is moving his lower extremities well.  He has chronic left foot drop.   Disposition: Discharge disposition: 06-Home-Health Care Svc       Discharge Instructions     Call MD for:  difficulty breathing, headache or visual disturbances   Complete by: As directed    Call MD for:  hives   Complete by: As directed    Call MD for:  persistant nausea and vomiting   Complete by: As directed  Call MD for:  redness, tenderness, or signs of infection (pain, swelling,  redness, odor or green/yellow discharge around incision site)   Complete by: As directed    Call MD for:  severe uncontrolled pain   Complete by: As directed    Diet - low sodium heart healthy   Complete by: As directed    Face-to-face encounter (required for Medicare/Medicaid patients)   Complete by: As directed    I Gerard JONETTA Beck certify that this patient is under my care and that I, or a nurse practitioner or physician's assistant working with me, had a face-to-face encounter that meets the physician face-to-face encounter requirements with th is patient on 01/01/2024. The encounter with the patient was in whole, or in part for the following medical condition(s) which is the primary reason for home health care (List medical condition): Lumbar stenosis   The encounter with the patient was in whole, or in part, for the following medical condition, which is the primary reason for home health care: Lumbar stenosis   I certify that, based on my findings, the following services are medically necessary home health services: Physical therapy   Reason for Medically Necessary Home Health Services: Therapy- Therapeutic Exercises to Increase Strength and Endurance   My clinical findings support the need for the above services: Unable to leave home safely without assistance and/or assistive device   Further, I certify that my clinical findings support that this patient is homebound due to: Unable to leave home safely without assistance   Home Health   Complete by: As directed    To provide the following care/treatments:  PT OT     If the dressing is still on your incision site when you go home, remove it on the third day after your surgery date. Remove dressing if it begins to fall off, or if it is dirty or damaged before the third day.   Complete by: As directed    Increase activity slowly   Complete by: As directed       Allergies as of 01/01/2024   No Known Allergies      Medication List      PAUSE taking these medications    clopidogrel  75 MG tablet Wait to take this until: January 04, 2024 Commonly known as: PLAVIX  TAKE 1 TABLET BY MOUTH EVERY DAY       STOP taking these medications    naproxen  sodium 220 MG tablet Commonly known as: ALEVE        TAKE these medications    ABSORBINE PLUS JR EX Apply 1 application  topically daily as needed (Pain).   alfuzosin  10 MG 24 hr tablet Commonly known as: UROXATRAL  Take 10 mg by mouth daily with breakfast.   atorvastatin  80 MG tablet Commonly known as: LIPITOR TAKE 1 TABLET BY MOUTH EVERY DAY   carvedilol  25 MG tablet Commonly known as: COREG  TAKE 1 TABLET (25 MG TOTAL) BY MOUTH 2 (TWO) TIMES DAILY. What changed: when to take this   chlorthalidone  50 MG tablet Commonly known as: HYGROTON  TAKE 1 TABLET BY MOUTH EVERY DAY   cyclobenzaprine  5 MG tablet Commonly known as: FLEXERIL  Take 1 tablet (5 mg total) by mouth 3 (three) times daily as needed for muscle spasms.   docusate sodium  100 MG capsule Commonly known as: COLACE Take 1 capsule (100 mg total) by mouth 2 (two) times daily.   ezetimibe  10 MG tablet Commonly known as: ZETIA  TAKE 1 TABLET BY MOUTH EVERY DAY  gabapentin 300 MG capsule Commonly known as: NEURONTIN Take 1 capsule (300 mg total) by mouth 3 (three) times daily.   hydrocortisone cream 0.5 % Apply 1 Application topically daily as needed for itching.   lidocaine  5 % Commonly known as: LIDODERM  Place 1 patch onto the skin daily.   metFORMIN  500 MG tablet Commonly known as: GLUCOPHAGE  Take 500 mg by mouth daily.   nitroGLYCERIN  0.4 MG SL tablet Commonly known as: Nitrostat  Place 1 tablet (0.4 mg total) under the tongue every 5 (five) minutes as needed for chest pain.   OMEGA-3 FATTY ACIDS PO Take 1,200 mg by mouth daily.   oxyCODONE -acetaminophen  5-325 MG tablet Commonly known as: PERCOCET/ROXICET Take 1-2 tablets by mouth every 4 (four) hours as needed for moderate  pain (pain score 4-6).   spironolactone 25 MG tablet Commonly known as: ALDACTONE Take 25 mg by mouth daily.   telmisartan  80 MG tablet Commonly known as: MICARDIS  TAKE 1 TABLET BY MOUTH EVERY DAY What changed: when to take this   vitamin D3 50 MCG (2000 UT) Caps Take 2,000 Units by mouth daily.               Discharge Care Instructions  (From admission, onward)           Start     Ordered   01/01/24 0000  If the dressing is still on your incision site when you go home, remove it on the third day after your surgery date. Remove dressing if it begins to fall off, or if it is dirty or damaged before the third day.        01/01/24 9041            Follow-up Information     Mavis Purchase, MD. Go on 01/24/2024.   Specialty: Neurosurgery Why: First post op appointment with x-rays is on 01/24/2024 at 2:00 PM. Contact information: [address] [place] [address] [phone]                 Signed: Gerard Beck, DNP, AGNP-C Nurse Practitioner  [facility] [address], [address], [place], [place] [address] P: [phone]    F: [phone]  01/01/2024, 9:59 AM

## 2024-01-01 NOTE — Plan of Care (Signed)
 Pt doing well. Pt and wife given D/C instructions with verbal understanding. Rx's were sent to the pharmacy by MD. Pt's incision is clean and dry with no sign of infection. Pt's IV and Hemovac were removed prior to D/C. Pt D/C'd home via wheelchair per MD order. Pt is stable @ D/C and has no other needs at this time. Rema Fendt, RN

## 2024-01-01 NOTE — Progress Notes (Signed)
 Physical Therapy Treatment  Patient Details Name: [name] MRN: [id number] DOB: Nov 19, 1940 Today's Date: 01/01/2024   History of Present Illness Pt is an 83 y/o male who presents s/p L2-L4 PLIF on 12/30/2023. PMH signficant for DM, OA, PAD, CABG, R TKA.    PT Comments  Pt progressing well with post-op mobility. He was able to demonstrate transfers and ambulation with gross CGA, however with poor posture and keeping walker farther away from him during turns, increasing risk for falls. Reinforced education on precautions, brace application/wearing schedule, appropriate activity progression, and car transfer. Provided gait belt, urinal. Will continue to follow.      If plan is discharge home, recommend the following: A little help with walking and/or transfers;A little help with bathing/dressing/bathroom;Assistance with cooking/housework;Assist for transportation   Can travel by private vehicle        Equipment Recommendations  Rolling walker (2 wheels);BSC/3in1    Recommendations for Other Services       Precautions / Restrictions Precautions Precautions: Fall;Back Precaution Booklet Issued: Yes (comment) Recall of Precautions/Restrictions: Intact Precaution/Restrictions Comments: Reviewed handout and pt was cued for precautions during functional mobility. Required Braces or Orthoses: Spinal Brace Spinal Brace: Lumbar corset;Applied in sitting position Restrictions Weight Bearing Restrictions Per Provider Order: No     Mobility  Bed Mobility Overal bed mobility: Needs Assistance Bed Mobility: Rolling, Sidelying to Sit Rolling: Supervision Sidelying to sit: Supervision       General bed mobility comments: VC's throughout for optimal log roll technqiue.    Transfers Overall transfer level: Needs assistance Equipment used: Rolling walker (2 wheels) Transfers: Sit to/from Stand Sit to Stand: Contact guard assist           General transfer comment: VC's for  hand placement on seated surface for safety.    Ambulation/Gait Ambulation/Gait assistance: Contact guard assist Gait Distance (Feet): 300 Feet Assistive device: Rolling walker (2 wheels) Gait Pattern/deviations: Step-through pattern, Decreased stride length, Trunk flexed, Decreased dorsiflexion - left Gait velocity: Decreased Gait velocity interpretation: <1.8 ft/sec, indicate of risk for recurrent falls   General Gait Details: VC's for improved posture, closer walker proximity and forward gaze. Pt required increased cues for safety and walker placement during turns, as losing balance with large amplitude turn attempts and walker placement far away.   Stairs             Wheelchair Mobility     Tilt Bed    Modified Rankin (Stroke Patients Only)       Balance Overall balance assessment: Needs assistance Sitting-balance support: Feet supported, No upper extremity supported Sitting balance-Leahy Scale: Fair     Standing balance support: No upper extremity supported, During functional activity, Reliant on assistive device for balance Standing balance-Leahy Scale: Poor                              Communication Communication Communication: No apparent difficulties  Cognition Arousal: Alert Behavior During Therapy: WFL for tasks assessed/performed   PT - Cognitive impairments: No apparent impairments                         Following commands: Intact      Cueing Cueing Techniques: Verbal cues, Gestural cues  Exercises      General Comments        Pertinent Vitals/Pain Pain Assessment Pain Assessment: Faces Faces Pain Scale: Hurts a little bit Pain Location: back/incision site  Pain Descriptors / Indicators: Operative site guarding, Sore Pain Intervention(s): Limited activity within patient's tolerance, Monitored during session, Repositioned    Home Living                          Prior Function            PT Goals  (current goals can now be found in the care plan section) Acute Rehab PT Goals Patient Stated Goal: Home today PT Goal Formulation: With patient/family Time For Goal Achievement: 01/07/24 Potential to Achieve Goals: Good Progress towards PT goals: Progressing toward goals    Frequency    Min 5X/week      PT Plan      Co-evaluation              AM-PAC PT 6 Clicks Mobility   Outcome Measure  Help needed turning from your back to your side while in a flat bed without using bedrails?: A Little Help needed moving from lying on your back to sitting on the side of a flat bed without using bedrails?: A Little Help needed moving to and from a bed to a chair (including a wheelchair)?: A Little Help needed standing up from a chair using your arms (e.g., wheelchair or bedside chair)?: A Little Help needed to walk in hospital room?: A Little Help needed climbing 3-5 steps with a railing? : A Little 6 Click Score: 18    End of Session Equipment Utilized During Treatment: Gait belt;Back brace Activity Tolerance: Patient tolerated treatment well Patient left: in bed;with call bell/phone within reach;with family/visitor present (Sitting EOB with wife present to assist with dressing.) Nurse Communication: Mobility status PT Visit Diagnosis: Unsteadiness on feet (R26.81);Pain Pain - part of body:  (back)     Time: 9097-9075 PT Time Calculation (min) (ACUTE ONLY): 22 min  Charges:    $Gait Training: 8-22 mins PT General Charges $$ ACUTE PT VISIT: 1 Visit                     [name], PT, DPT Acute Rehabilitation Services Secure Chat Preferred Office: [phone]    [name] 01/01/2024, 10:42 AM

## 2024-01-01 NOTE — Progress Notes (Signed)
   01/01/24 0535  What Happened  Was fall witnessed? Yes  Who witnessed fall? Misbah,CNA  Patients activity before fall ambulating-assisted  Point of contact buttocks  Was patient injured? No  Provider Notification  Provider Name/Title Dorn Glade, MD (on call MD)  Date Provider Notified 01/01/24  Time Provider Notified 0559  Method of Notification Call  Notification Reason Fall  Provider response No new orders  Date of Provider Response 01/01/24  Time of Provider Response 0559  Follow Up  Family notified Yes - comment  Time family notified 89 (RUBY wife. in the patient's room)  Additional tests No  Adult Fall Risk Assessment  Risk Factor Category (scoring not indicated) Fall has occurred during this admission (document High fall risk)  Age 83  Fall History: Fall within 6 months prior to admission 0  Elimination; Bowel and/or Urine Incontinence 0  Elimination; Bowel and/or Urine Urgency/Frequency 0  Medications: includes PCA/Opiates, Anti-convulsants, Anti-hypertensives, Diuretics, Hypnotics, Laxatives, Sedatives, and Psychotropics 3  Patient Care Equipment 0  Mobility-Assistance 2  Mobility-Gait 2  Mobility-Sensory Deficit 0  Altered awareness of immediate physical environment 0  Impulsiveness 0  Lack of understanding of one's physical/cognitive limitations 0  Total Score 10  Patient Fall Risk Level High fall risk  Adult Fall Risk Interventions  Required Bundle Interventions *See Row Information* High fall risk  Required Bundle Interventions *See Row Information* High fall risk  Additional Interventions Family Supervision;PT/OT need assessed if change in mobility from baseline;Use of appropriate toileting equipment (bedpan, BSC, etc.)  Pain Assessment  Pain Scale 0-10  Pain Score 0  PCA/Epidural/Spinal Assessment  Respiratory Pattern Regular;Unlabored  Neurological  Neuro (WDL) WDL  Level of Consciousness Alert  Orientation Level Oriented X4  Cognition  Appropriate at baseline  Speech Clear  Motor Function/Sensation Assessment Motor response;Sensation;Motor strength  R Foot Dorsiflexion Moderate  L Foot Dorsiflexion Moderate  R Foot Plantar Flexion Moderate  L Foot Plantar Flexion Moderate  RUE Motor Response Purposeful movement  RUE Sensation Full sensation  RUE Motor Strength 5  LUE Motor Response Purposeful movement  LUE Sensation Full sensation  LUE Motor Strength 5  RLE Motor Response Purposeful movement  RLE Sensation Full sensation  RLE Motor Strength 5  LLE Motor Response Purposeful movement  LLE Sensation Full sensation  LLE Motor Strength 4 (left foot drop)  Musculoskeletal  Assistive Device Front wheel walker  Generalized Weakness Yes  Weight Bearing Restrictions Per Provider Order No  Musculoskeletal Details  Lower Back Limited movement;Ortho/Supportive Device;Surgery  Lower Back Ortho/Supportive Device Back Brace  Back Brace LSO  Integumentary  Integumentary (WDL) WDL  Skin Color Appropriate for ethnicity  Skin Condition Dry  Skin Integrity Intact;Other (Comment) (surgical incision)   V/S BP 165/71 (98) PR85 RR18 T97.7 100%RA.  9464, Upon walking in the hallway with the CNA this AM, the patient had a fall. It was an assisted fall. According to the CNA, he tried to catch the patient but both of them fell on the ground. Both were on a sitting position. Patient has a left foot drop so when he turned he had lost his balance. Patient didn't hit his head nor his surgical site. No apparent injuries noted. Denies any pain at this moment. VS stable. Patient's wife made aware and MD on call made aware. Patient was assisted back in bed with call bell within reach. Will continue to monitor

## 2024-01-01 NOTE — Discharge Instructions (Signed)
Wound Care Keep incision covered and dry until post op day 3. You may remove the Honeycomb dressing on post op day 3. Leave steri-strips on back.  They will fall off by themselves. Do not put any creams, lotions, or ointments on incision. You are fine to shower. Let water run over incision and pat dry.  Activity Walk each and every day, increasing distance each day. No lifting greater than 5 lbs.  Avoid excessive back motion. No driving for 2 weeks; may ride as a passenger locally.  Diet Resume your normal diet.  Call Your Doctor If Any of These Occur Redness, drainage, or swelling at the wound.  Temperature greater than 101 degrees. Severe pain not relieved by pain medication. Incision starts to come apart.  Follow Up Appt Call [phone] today for appointment in 3-4 weeks if you don't already have one or for any problems.  If you have any hardware placed in your spine, you will need an x-ray before your appointment.

## 2024-01-02 NOTE — Progress Notes (Addendum)
 01/02/2024 at 10:27 am:  CM has called all the home health agencies in [place]  and none have accepted him. Either he is out of network or they dont have the staff. CM has updated pts spouse and PA for Dr Mavis.

## 2024-02-23 ENCOUNTER — Other Ambulatory Visit: Payer: Self-pay | Admitting: Cardiology

## 2024-05-12 ENCOUNTER — Encounter

## 2024-05-12 ENCOUNTER — Ambulatory Visit
